# Patient Record
Sex: Male | Born: 1955 | Race: White | Hispanic: No | Marital: Married | Smoking: Never smoker
Health system: Southern US, Community
[De-identification: ages and names within clinical notes are randomized; demographics above are authoritative.]

## PROBLEM LIST (undated history)

## (undated) DIAGNOSIS — M1990 Unspecified osteoarthritis, unspecified site: Secondary | ICD-10-CM

## (undated) DIAGNOSIS — Z87442 Personal history of urinary calculi: Secondary | ICD-10-CM

## (undated) DIAGNOSIS — I10 Essential (primary) hypertension: Secondary | ICD-10-CM

## (undated) DIAGNOSIS — R0600 Dyspnea, unspecified: Secondary | ICD-10-CM

## (undated) DIAGNOSIS — J189 Pneumonia, unspecified organism: Secondary | ICD-10-CM

## (undated) DIAGNOSIS — E119 Type 2 diabetes mellitus without complications: Secondary | ICD-10-CM

## (undated) DIAGNOSIS — K219 Gastro-esophageal reflux disease without esophagitis: Secondary | ICD-10-CM

## (undated) DIAGNOSIS — E785 Hyperlipidemia, unspecified: Secondary | ICD-10-CM

## (undated) DIAGNOSIS — M109 Gout, unspecified: Secondary | ICD-10-CM

## (undated) DIAGNOSIS — I2510 Atherosclerotic heart disease of native coronary artery without angina pectoris: Secondary | ICD-10-CM

## (undated) DIAGNOSIS — I499 Cardiac arrhythmia, unspecified: Secondary | ICD-10-CM

## (undated) HISTORY — PX: SURGERY SCROTAL / TESTICULAR: SUR1316

## (undated) HISTORY — PX: TONSILLECTOMY: SUR1361

## (undated) HISTORY — PX: CHOLECYSTECTOMY: SHX55

## (undated) HISTORY — PX: APPENDECTOMY: SHX54

## (undated) HISTORY — DX: Atherosclerotic heart disease of native coronary artery without angina pectoris: I25.10

## (undated) HISTORY — DX: Hyperlipidemia, unspecified: E78.5

## (undated) HISTORY — PX: KNEE SURGERY: SHX244

## (undated) NOTE — Progress Notes (Signed)
 Formatting of this note might be different from the original. 48 Hour Holter monitor placement has been establish. Patient has been advised to keep the monitor dry as well to return the monitor to the main desk.  Electronically signed by Carlin Fabry Dent, Tech at 12/23/2020  4:13 PM EST

## (undated) NOTE — Progress Notes (Signed)
 Formatting of this note is different from the original. Images from the original note were not included.  [facility] 12/21/2020   Patient Name:  Parker Daniel  (MRN: [id number])   DOB: 1956/09/04    Primary Care Provider: Donnice SAUNDERS. Messier, MD  Primary Cardiologist: No primary care provider on file.  Primary Electrophysiologist: Michalene Skene, M.D., M.P.H., F.A.C.C. Primary Cardiology APP: Delon Louder, PA-C   Atrial Fibrillation Clinic New Patient Consultation   Assessment/Plan:  1. Persistent Atrial Fibrillation  ? CHA2DS2-VASc is 2 (2.2% adjusted Annual Stroke Risk); he will be started on anticoagulation for stroke risk reduction (Eliquis ) ? Although he is asymptomatic when in atrial fibrillation, we will complete workup first, and then decide whether to pursue rhythm control, or rate control ? start apixaban  for stroke risk reduction ? 48 hour holter monitor ordered to assess rate response and to see if atrial fib is persistent or paroxysmal ? Ordered Echocardiogram to assess LV function, LA size, and valvular structure.  ?  Because of concerning symptoms, I will order a stress test to assess for underlying coronary artery disease, and this may also help to guide antiarrhythmic therapy choices in the future ? Followup with electrophysioloy in about 6 weeks  2. Risk factors modification: We discussed the importance of aggressive risk factor modification as it relates to successful management of atrial fibrillation. ? We reviewed the importance of regular exercise and a healthy diet. ? We reviewed the importance of blood pressure control. ? We reviewed the importance of reduction and or abstinence of tobacco and alcohol use. ? He declines a sleep study at this time  1. New onset atrial fibrillation (HCC)   2. Essential hypertension   3. Controlled type 2 diabetes mellitus without complication, without long-term current use of insulin  (HCC)   4. Severe  obesity (BMI 35.0-39.9) with comorbidity [facility])     Subjective:  Chief Complaint Chief Complaint  Patient presents with  ? New Patient    afib    History of Present Illness Parker Daniel is a 49 y.o. male who is referred to [facility] for evaluation and management of persistent atrial fibrillation.    His medical problems include diet-controlled diabetes mellitus, hypercholesterolemia, hypertension, spinal stenosis, obesity, and anxiety. Had been on metformin and lisinopril in the past, these were discontinued in summer 2020 when he presented with dehydration and SBP in the 70's systolic.   Atrial fibrillation reportedly diagnosed on [facility] screen a couple months ago.  He was seen by his PCP on 12/09/2020, and diagnosis was confirmed with an EKG.  He has no rhythm awareness, and denies any palpitations.  He has no chest pain or pressure with exertion, but does tell me that he gets out of breath easily, and that this is going on for probably a couple of years.  When asked how much exertion he is able to do he says ?not much? he gets short of breath walking up a flight of stairs or walking around the block.  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  He denies orthopnea and paroxysmal nocturnal dyspnea.  No lightheadedness, dizziness, near-syncope or syncope.    Allergies Allergies  Allergen Reactions  ? Crestor [Rosuvastatin] Myalgia    Takes pravastatin  at home  ? Iodinated Contrast Media Other (See Comments)    Cannot remember well--thought he was going to  pass out  ? Lipitor [Atorvastatin ] Myalgia    Takes pravastatin  at home    Medications Current Outpatient Medications  Medication Sig Dispense Refill  ? allopurinoL  (ZYLOPRIM ) 300 MG tablet Take 1 tablet (300 mg total) by mouth daily. 90 tablet 3  ? aspirin  (ECOTRIN) 81 MG EC tablet Take 1 tablet (81 mg total) by  mouth daily. 30 tablet 11  ? gabapentin  (NEURONTIN ) 300 MG capsule Take 2 capsules (600 mg total) by mouth 3 (three) times daily. 540 capsule 3  ? NARCAN 4 mg/actuation nasal spray use as directed PER package instructions 2 each 0  ? [START ON 01/09/2021] oxyCODONE  (OXYCONTIN ) 20 MG 12 hr tablet Take 1 tablet (20 mg total) by mouth every 12 (twelve) hours. 60 tablet 0  ? [START ON 02/06/2021] oxyCODONE  (OXYCONTIN ) 20 MG 12 hr tablet Take 1 tablet (20 mg total) by mouth every 12 (twelve) hours. 60 tablet 0  ? [START ON 01/09/2021] oxyCODONE  (ROXICODONE ) 15 MG immediate release tablet Take 1 tablet (15 mg total) by mouth 2 (two) times daily as needed for Pain. 60 tablet 0  ? [START ON 02/06/2021] oxyCODONE  (ROXICODONE ) 15 MG immediate release tablet Take 1 tablet (15 mg total) by mouth 2 (two) times daily as needed for Pain. 60 tablet 0  ? pravastatin  (PRAVACHOL ) 40 MG tablet Take 1 tablet (40 mg total) by mouth daily. 90 tablet 3  ? sennosides (SENNA LAX ORAL) Take 4 capsules by mouth daily as needed.     ? testosterone cypionate (DEPOTESTOTERONE CYPIONATE) 200 mg/mL injection Inject 200 mg into the muscle every 21 days.     ? traZODone  (DESYREL ) 50 MG tablet Take 1 tablet (50 mg total) by mouth nightly. 30 tablet 0   No current facility-administered medications for this visit.    Past Medical History Past Medical History:  Diagnosis Date  ? Acute tonsillitis 12/02/2010  ? Arthritis   ? Carpal tunnel syndrome of right wrist 02/24/2011  ? Chronic pain    back, right knee  ? Colon polyp   ? 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 Diabetes mellitus, type II (HCC)    NIDDM--borderlilne  ? DOE (dyspnea on exertion)    says he is deconditioned--DOE w/ extended activities  ? Dysuria 12/02/2010  ? Gout 12/02/2010  ? History of appendectomy and surgical kidney stone removal 12/02/2010  ? History of BPH   ? History of cholecystectomy 12/02/2010  ? Hyperlipidemia 12/02/2010  ? Hypertension  12/02/2010  ? Indwelling urethral catheter present    since @ 11-19-17  ? Nephrolithiasis 12/02/2010  ? Nonspecific abnormal results of function study of liver 12/02/2010  ? Spinal stenosis, lumbar 12/02/2010    Surgical History Past Surgical History:  Procedure Laterality Date  ? APPENDECTOMY    ? CHOLECYSTECTOMY    ? COLONOSCOPY  05/30/2012, 03/25/2007   (+ polyps)    HGE   ? KIDNEY STONE SURGERY    ? LITHOTRIPSY     x 2  ? POLYPECTOMY    ? SURGERY SCROTAL / TESTICULAR     REMOVED AT AGE 89? SIDE?     Family History He family history includes Cancer - Other in his father; Kidney disease in an other family member.   Social History  He  reports that he has never smoked. He has never used smokeless tobacco. He reports current alcohol use. He reports that he does not use drugs.   Review of Systems General:  negative for fevers, chills, fatigue, night sweats or weight  changes  Respiratory: negative for shortness of breath, cough, wheezing or hemoptysis  Cardiovascular:   as in HPI  Gastrointestinal:  negative for nausea, vomiting, heartburn, abdominal pain or melena  Musculoskeletal: negative for joint stiffness, joint swelling, muscle pain or weakness  Neurological: negative for seizures, tremors, sensory changes or weakness   Objective:  Physical Exam BP 138/86   Pulse 96   Ht 5' 9 (1.753 m)   Wt 266 lb (121 kg)   BMI 39.28 kg/m   BP Readings from Last 2 Encounters:  12/21/20 138/86  12/09/20 138/84   Wt Readings from Last 5 Encounters:  12/21/20 266 lb (121 kg)  12/09/20 266 lb (121 kg)  09/09/20 256 lb (116 kg)  06/10/20 247 lb (112 kg)  06/04/20 247 lb (112 kg)   General Appearance:  Alert, cooperative, no acute distress  Neck: No JVD or carotid bruits  Lungs:   Good respiratory effort, Lungs are clear to auscultation   Heart:  irregular rhythm, normal rate with normal s1, s2, no murmur, no rub or gallop   Abdomen:   Normoactive bowel sounds, soft, non-tender,  no distention  Extremities: No cyanosis or edema   Pulses: 2+ and symmetric  Skin: Intact, no rashes or lesions  Neurologic: Grossly non-focal, AOx3   Data:  ECG 12/21/2020:  Atrial fibrillation with a ventricular rate of 96 beats per minute, normal intervals and axis, no acute ST T wave changes noted, no ectopy.  No specialty comments available.   Labs:  Pertinent Labs (& date obtained) Lab Results  Component Value Date   K 4.5 12/09/2020   CREATININE 1.1 12/09/2020   WBC 8.42 06/04/2020   HGB 12.4 (L) 06/04/2020   HCT 41.6 06/04/2020   PLT 317.00 06/04/2020   TSHHIGHSENSI 0.78 06/04/2020   ALT 26 12/09/2020   AST 25.0 12/09/2020   Stroke Risk Calculator: CHA2DS2-VASc Score _0__ Congestive Heart Failure/Left Ventricular Dysfunction _1__ Hypertension _0__ Age (65-74 years) _2__ Diabetes Mellitus _0__ Stroke, TIA, or Thromboembolism (yes = 2) _0__ Vascular Disease _0__ Age (>75 years) _0__ Sex/Gender (male = 1, male = 0) Total: 2 (2.2% adjusted Annual Stroke Risk), anticoagulation is indicated. (ref. Chadsvasc.org)  We discussed in detail the pathophysiology, etiology, and manifestations of atrial fibrillation, including slow or fast heart rate, congestive heart failure, and stroke potential.  We discussed the clinical risk factors for atrial fibrillation, including our recommendation for aggressive control and management of all of these risk factors as it pertains to overall success of management of atrial fibrillation.   We discussed the risks and benefits of anticoagulation for stroke risk reduction.    Return in about 4 weeks (around 01/18/2021) for 4-6 weeks Dr. Melanee. An After Visit Summary (AVS) was printed and given to the patient.  __________________________ Delon Louder, PA-C [facility] Electronically signed by Michalene Melanee, MD at 12/21/2020  4:50 PM EST

---

## 2010-12-02 DIAGNOSIS — M48061 Spinal stenosis, lumbar region without neurogenic claudication: Secondary | ICD-10-CM | POA: Insufficient documentation

## 2010-12-02 DIAGNOSIS — M109 Gout, unspecified: Secondary | ICD-10-CM | POA: Insufficient documentation

## 2010-12-02 DIAGNOSIS — E1169 Type 2 diabetes mellitus with other specified complication: Secondary | ICD-10-CM | POA: Insufficient documentation

## 2014-08-20 DIAGNOSIS — Z79899 Other long term (current) drug therapy: Secondary | ICD-10-CM | POA: Insufficient documentation

## 2016-03-09 DIAGNOSIS — E119 Type 2 diabetes mellitus without complications: Secondary | ICD-10-CM | POA: Insufficient documentation

## 2017-06-08 DIAGNOSIS — E538 Deficiency of other specified B group vitamins: Secondary | ICD-10-CM | POA: Insufficient documentation

## 2017-06-08 DIAGNOSIS — G609 Hereditary and idiopathic neuropathy, unspecified: Secondary | ICD-10-CM | POA: Insufficient documentation

## 2017-12-06 DIAGNOSIS — R338 Other retention of urine: Secondary | ICD-10-CM | POA: Insufficient documentation

## 2019-04-01 ENCOUNTER — Emergency Department (HOSPITAL_COMMUNITY): Payer: Medicare HMO

## 2019-04-01 ENCOUNTER — Emergency Department (HOSPITAL_COMMUNITY)
Admission: EM | Admit: 2019-04-01 | Discharge: 2019-04-01 | Disposition: A | Discharge status: Home / Self Care | Payer: Medicare HMO | Attending: Emergency Medicine | Admitting: Emergency Medicine

## 2019-04-01 ENCOUNTER — Other Ambulatory Visit: Payer: Self-pay

## 2019-04-01 ENCOUNTER — Ambulatory Visit (INDEPENDENT_AMBULATORY_CARE_PROVIDER_SITE_OTHER)
Admission: EM | Admit: 2019-04-01 | Discharge: 2019-04-01 | Disposition: A | Discharge status: Home / Self Care | Payer: Medicare HMO | Source: Home / Self Care

## 2019-04-01 DIAGNOSIS — Z20828 Contact with and (suspected) exposure to other viral communicable diseases: Secondary | ICD-10-CM | POA: Insufficient documentation

## 2019-04-01 DIAGNOSIS — R1030 Lower abdominal pain, unspecified: Secondary | ICD-10-CM | POA: Diagnosis not present

## 2019-04-01 DIAGNOSIS — I10 Essential (primary) hypertension: Secondary | ICD-10-CM | POA: Insufficient documentation

## 2019-04-01 DIAGNOSIS — E119 Type 2 diabetes mellitus without complications: Secondary | ICD-10-CM | POA: Diagnosis not present

## 2019-04-01 DIAGNOSIS — J189 Pneumonia, unspecified organism: Secondary | ICD-10-CM | POA: Diagnosis not present

## 2019-04-01 DIAGNOSIS — Z79899 Other long term (current) drug therapy: Secondary | ICD-10-CM | POA: Diagnosis not present

## 2019-04-01 DIAGNOSIS — R1013 Epigastric pain: Secondary | ICD-10-CM | POA: Diagnosis not present

## 2019-04-01 DIAGNOSIS — Z7984 Long term (current) use of oral hypoglycemic drugs: Secondary | ICD-10-CM | POA: Insufficient documentation

## 2019-04-01 DIAGNOSIS — R1010 Upper abdominal pain, unspecified: Secondary | ICD-10-CM

## 2019-04-01 HISTORY — DX: Type 2 diabetes mellitus without complications: E11.9

## 2019-04-01 HISTORY — DX: Gout, unspecified: M10.9

## 2019-04-01 HISTORY — DX: Unspecified osteoarthritis, unspecified site: M19.90

## 2019-04-01 HISTORY — DX: Essential (primary) hypertension: I10

## 2019-04-01 LAB — COMPREHENSIVE METABOLIC PANEL
ALT: 21 U/L (ref 0–44)
AST: 17 U/L (ref 15–41)
Albumin: 3.4 g/dL — ABNORMAL LOW (ref 3.5–5.0)
Alkaline Phosphatase: 76 U/L (ref 38–126)
Anion gap: 12 (ref 5–15)
BUN: 15 mg/dL (ref 8–23)
CO2: 23 mmol/L (ref 22–32)
Calcium: 10 mg/dL (ref 8.9–10.3)
Chloride: 102 mmol/L (ref 98–111)
Creatinine, Ser: 1.14 mg/dL (ref 0.61–1.24)
GFR calc Af Amer: >60 mL/min (ref >60)
GFR calc non Af Amer: >60 mL/min (ref >60)
Glucose, Bld: 117 mg/dL — ABNORMAL HIGH (ref 70–99)
Potassium: 3.7 mmol/L (ref 3.5–5.1)
Sodium: 137 mmol/L (ref 135–145)
Total Bilirubin: 0.9 mg/dL (ref 0.3–1.2)
Total Protein: 7.8 g/dL (ref 6.5–8.1)

## 2019-04-01 LAB — URINALYSIS, ROUTINE W REFLEX MICROSCOPIC
Bilirubin Urine: NEGATIVE
Glucose, UA: NEGATIVE mg/dL
Hgb urine dipstick: NEGATIVE
Ketones, ur: 20 mg/dL — AB
Leukocytes,Ua: NEGATIVE
Nitrite: NEGATIVE
Protein, ur: NEGATIVE mg/dL
Specific Gravity, Urine: 1.019 (ref 1.005–1.030)
pH: 7 (ref 5.0–8.0)

## 2019-04-01 LAB — CBC WITH DIFFERENTIAL/PLATELET
Abs Immature Granulocytes: 0.7 10*3/uL — ABNORMAL HIGH (ref 0.00–0.07)
Basophils Absolute: 0.1 10*3/uL (ref 0.0–0.1)
Basophils Relative: 1 %
Eosinophils Absolute: 0.1 10*3/uL (ref 0.0–0.5)
Eosinophils Relative: 1 %
HCT: 36.6 % — ABNORMAL LOW (ref 39.0–52.0)
Hemoglobin: 12 g/dL — ABNORMAL LOW (ref 13.0–17.0)
Immature Granulocytes: 6 %
Lymphocytes Relative: 16 %
Lymphs Abs: 2.1 10*3/uL (ref 0.7–4.0)
MCH: 30.4 pg (ref 26.0–34.0)
MCHC: 32.8 g/dL (ref 30.0–36.0)
MCV: 92.7 fL (ref 80.0–100.0)
Monocytes Absolute: 1.4 10*3/uL — ABNORMAL HIGH (ref 0.1–1.0)
Monocytes Relative: 11 %
Neutro Abs: 8.4 10*3/uL — ABNORMAL HIGH (ref 1.7–7.7)
Neutrophils Relative %: 65 %
Platelets: 273 10*3/uL (ref 150–400)
RBC: 3.95 MIL/uL — ABNORMAL LOW (ref 4.22–5.81)
RDW: 13.2 % (ref 11.5–15.5)
WBC: 12.7 10*3/uL — ABNORMAL HIGH (ref 4.0–10.5)
nRBC: 0.2 % (ref 0.0–0.2)

## 2019-04-01 LAB — CBC
HCT: 36.8 % — ABNORMAL LOW (ref 39.0–52.0)
Hemoglobin: 11.8 g/dL — ABNORMAL LOW (ref 13.0–17.0)
MCH: 29.5 pg (ref 26.0–34.0)
MCHC: 32.1 g/dL (ref 30.0–36.0)
MCV: 92 fL (ref 80.0–100.0)
Platelets: 278 10*3/uL (ref 150–400)
RBC: 4 MIL/uL — ABNORMAL LOW (ref 4.22–5.81)
RDW: 13.1 % (ref 11.5–15.5)
WBC: 12.8 10*3/uL — ABNORMAL HIGH (ref 4.0–10.5)
nRBC: 0.2 % (ref 0.0–0.2)

## 2019-04-01 LAB — SARS CORONAVIRUS 2 BY RT PCR (HOSPITAL ORDER, PERFORMED IN ~~LOC~~ HOSPITAL LAB): SARS Coronavirus 2: NEGATIVE

## 2019-04-01 LAB — LIPASE, BLOOD: Lipase: 25 U/L (ref 11–51)

## 2019-04-01 LAB — LACTIC ACID, PLASMA: Lactic Acid, Venous: 1 mmol/L (ref 0.5–1.9)

## 2019-04-01 MED ORDER — MORPHINE SULFATE (PF) 4 MG/ML IV SOLN
4.0000 mg | Freq: Once | INTRAVENOUS | Status: AC
  Administered 2019-04-01: 16:00:00 4 mg via INTRAVENOUS
  Filled 2019-04-01: qty 1

## 2019-04-01 MED ORDER — DOXYCYCLINE HYCLATE 100 MG PO CAPS: 100.0000 mg | ORAL_CAPSULE | Freq: Two times a day (BID) | ORAL | 0 refills | Status: AC

## 2019-04-01 MED ORDER — PANTOPRAZOLE SODIUM 20 MG PO TBEC: 20.0000 mg | DELAYED_RELEASE_TABLET | Freq: Every day | ORAL | 0 refills | Status: DC

## 2019-04-01 MED ORDER — IOHEXOL 300 MG/ML  SOLN
100.0000 mL | Freq: Once | INTRAMUSCULAR | Status: AC | PRN
  Administered 2019-04-01: 18:00:00 100 mL via INTRAVENOUS

## 2019-04-01 MED ORDER — SODIUM CHLORIDE 0.9 % IV SOLN
500.0000 mg | Freq: Once | INTRAVENOUS | Status: AC
  Administered 2019-04-01: 21:00:00 500 mg via INTRAVENOUS
  Filled 2019-04-01: qty 500

## 2019-04-01 MED ORDER — MORPHINE SULFATE (PF) 4 MG/ML IV SOLN
4.0000 mg | Freq: Once | INTRAVENOUS | Status: AC
  Administered 2019-04-01: 21:00:00 4 mg via INTRAVENOUS
  Filled 2019-04-01: qty 1

## 2019-04-01 MED ORDER — LIDOCAINE VISCOUS HCL 2 % MT SOLN
15.0000 mL | Freq: Once | OROMUCOSAL | Status: AC
  Administered 2019-04-01: 19:00:00 15 mL via ORAL
  Filled 2019-04-01: qty 15

## 2019-04-01 MED ORDER — HYDROMORPHONE HCL 1 MG/ML IJ SOLN
1.0000 mg | Freq: Once | INTRAMUSCULAR | Status: AC
  Administered 2019-04-01: 1 mg via INTRAVENOUS
  Filled 2019-04-01: qty 1

## 2019-04-01 MED ORDER — SODIUM CHLORIDE 0.9 % IV SOLN
INTRAVENOUS | Status: DC | PRN
  Administered 2019-04-01: 21:00:00 via INTRAVENOUS

## 2019-04-01 MED ORDER — PANTOPRAZOLE SODIUM 40 MG IV SOLR
40.0000 mg | Freq: Once | INTRAVENOUS | Status: AC
  Administered 2019-04-01: 23:00:00 40 mg via INTRAVENOUS
  Filled 2019-04-01: qty 40

## 2019-04-01 MED ORDER — ALUM & MAG HYDROXIDE-SIMETH 200-200-20 MG/5ML PO SUSP
30.0000 mL | Freq: Once | ORAL | Status: AC
  Administered 2019-04-01: 30 mL via ORAL
  Filled 2019-04-01: qty 30

## 2019-04-01 MED ORDER — ONDANSETRON HCL 4 MG/2ML IJ SOLN
4.0000 mg | Freq: Once | INTRAMUSCULAR | Status: AC
  Administered 2019-04-01: 4 mg via INTRAVENOUS
  Filled 2019-04-01: qty 2

## 2019-04-01 MED ORDER — ONDANSETRON HCL 4 MG/2ML IJ SOLN
2.0000 mg | Freq: Once | INTRAMUSCULAR | Status: AC
  Administered 2019-04-01: 19:00:00 2 mg via INTRAVENOUS
  Filled 2019-04-01: qty 2

## 2019-04-01 MED ORDER — SODIUM CHLORIDE 0.9 % IV SOLN
1.0000 g | Freq: Once | INTRAVENOUS | Status: AC
  Administered 2019-04-01: 1 g via INTRAVENOUS
  Filled 2019-04-01: qty 10

## 2019-04-01 MED ORDER — SODIUM CHLORIDE 0.9 % IV BOLUS
1000.0000 mL | Freq: Once | INTRAVENOUS | Status: AC
  Administered 2019-04-01: 1000 mL via INTRAVENOUS

## 2019-04-01 NOTE — ED Provider Notes (Signed)
[facility] EMERGENCY DEPARTMENT Provider Note   CSN: [id number] Arrival date & time: 04/01/19  1350    History   Chief Complaint Chief Complaint  Patient presents with   Abdominal Pain    HPI Parker Daniel is a 63 y.o. male with history of hypertension, diabetes, gout presenting for abdominal pain.  Patient reports abdominal pain onset 2-3 days constant since onset burning central abdomen epigastric area occasionally radiating down to the lower abdomen worsened with outpatient temporarily improved with drinking milk.  Patient denies similar pain in the past.  Associated symptoms nausea/vomiting 2-3 episodes nonbloody/nonbilious.  Patient denies history of fever/chills, cough/shortness of breath, chest pain, diarrhea, dysuria/hematuria, testicular pain/swelling, history of gastric ulcer disease or any additional concerns at this time.     HPI  Past Medical History:  Diagnosis Date   Arthritis    Diabetes mellitus without complication (HCC)    Gout    Hypertension     There are no active problems to display for this patient.   Past Surgical History:  Procedure Laterality Date   KNEE SURGERY          Home Medications    Prior to Admission medications   Medication Sig Start Date End Date Taking? Authorizing Provider  allopurinol (ZYLOPRIM) 300 MG tablet Take 300 mg by mouth daily.   Yes [provider]  gabapentin (NEURONTIN) 300 MG capsule Take 600 mg by mouth 3 (three) times daily.   Yes [provider]  lisinopril (ZESTRIL) 20 MG tablet Take 20 mg by mouth daily.   Yes [provider]  metFORMIN (GLUCOPHAGE) 500 MG tablet Take 500 mg by mouth 2 (two) times daily with a meal.   Yes [provider]  naproxen sodium (ALEVE) 220 MG tablet Take 220 mg by mouth daily as needed (pain).    Yes [provider]  oxyCODONE (OXYCONTIN) 20 mg 12 hr tablet Take 20 mg by mouth every 12 (twelve) hours.   Yes  [provider]  oxyCODONE (ROXICODONE) 15 MG immediate release tablet Take 15 mg by mouth 2 (two) times daily as needed for pain.   Yes [provider]  doxycycline (VIBRAMYCIN) 100 MG capsule Take 1 capsule (100 mg total) by mouth 2 (two) times daily for 7 days. 04/01/19 04/08/19  Harlene Salts A, PA-C  pantoprazole (PROTONIX) 20 MG tablet Take 1 tablet (20 mg total) by mouth daily. 04/01/19   Bill Salinas, PA-C    Family History No family history on file.  Social History Social History   Tobacco Use   Smoking status: Never Smoker   Smokeless tobacco: Never Used  Substance Use Topics   Alcohol use: Not Currently   Drug use: Not on file     Allergies   Patient has no known allergies.   Review of Systems Review of Systems  Constitutional: Negative.  Negative for chills and fever.  Respiratory: Negative.  Negative for cough and shortness of breath.   Cardiovascular: Negative.  Negative for chest pain and leg swelling.  Gastrointestinal: Positive for abdominal pain, nausea and vomiting. Negative for diarrhea (Reports soft stools, denies diarrhea).  Genitourinary: Negative.  Negative for dysuria, hematuria, scrotal swelling and testicular pain.  All other systems reviewed and are negative.  Physical Exam Updated Vital Signs BP 130/87    Pulse 85    Temp 98.2 F (36.8 C) (Oral)    Resp 18    Ht 6' (1.829 m)    [employer]  102.1 kg    SpO2 96%    BMI 30.52 kg/m   Physical Exam Constitutional:      General: He is not in acute distress.    Appearance: Normal appearance. He is well-developed. He is not ill-appearing or diaphoretic.     Comments: Uncomfortable appearing  HENT:     Head: Normocephalic and atraumatic.     Right Ear: External ear normal.     Left Ear: External ear normal.     Nose: Nose normal.  Eyes:     General: Vision grossly intact. Gaze aligned appropriately.     Pupils: Pupils are equal, round, and reactive to light.  Neck:      Musculoskeletal: Normal range of motion and neck supple.     Trachea: Trachea and phonation normal. No tracheal deviation.  Cardiovascular:     Rate and Rhythm: Normal rate and regular rhythm.     Pulses:          Radial pulses are 2+ on the right side and 2+ on the left side.       Dorsalis pedis pulses are 2+ on the right side and 2+ on the left side.       Posterior tibial pulses are 2+ on the right side and 2+ on the left side.     Heart sounds: Normal heart sounds.  Pulmonary:     Effort: Pulmonary effort is normal. No respiratory distress.     Breath sounds: Normal breath sounds. No rhonchi.  Abdominal:     General: Bowel sounds are normal. There is no distension.     Palpations: Abdomen is soft. There is no pulsatile mass.     Tenderness: There is abdominal tenderness in the epigastric area and periumbilical area. There is guarding (Voluntary). There is no right CVA tenderness, left CVA tenderness or rebound. Negative signs include Murphy's sign, Rovsing's sign and McBurney's sign.  Genitourinary:    Comments: Exam deferred by patient Musculoskeletal: Normal range of motion.  Feet:     Right foot:     Protective Sensation: 2 sites tested. 2 sites sensed.     Left foot:     Protective Sensation: 2 sites tested. 2 sites sensed.  Skin:    General: Skin is warm and dry.  Neurological:     Mental Status: He is alert.     GCS: GCS eye subscore is 4. GCS verbal subscore is 5. GCS motor subscore is 6.     Comments: Speech is clear and goal oriented, follows commands Major Cranial nerves without deficit, no facial droop Moves extremities without ataxia, coordination intact  Psychiatric:        Behavior: Behavior normal.     ED Treatments / Results  Labs (all labs ordered are listed, but only abnormal results are displayed) Labs Reviewed  COMPREHENSIVE METABOLIC PANEL - Abnormal; Notable for the following components:      Result Value   Glucose, Bld 117 (*)    Albumin 3.4 (*)     All other components within normal limits  CBC - Abnormal; Notable for the following components:   WBC 12.8 (*)    RBC 4.00 (*)    Hemoglobin 11.8 (*)    HCT 36.8 (*)    All other components within normal limits  URINALYSIS, ROUTINE W REFLEX MICROSCOPIC - Abnormal; Notable for the following components:   Ketones, ur 20 (*)    All other components within normal limits  CBC WITH DIFFERENTIAL/PLATELET - Abnormal; Notable  for the following components:   WBC 12.7 (*)    RBC 3.95 (*)    Hemoglobin 12.0 (*)    HCT 36.6 (*)    Neutro Abs 8.4 (*)    Monocytes Absolute 1.4 (*)    Abs Immature Granulocytes 0.70 (*)    All other components within normal limits  SARS CORONAVIRUS 2 (HOSPITAL ORDER, PERFORMED IN [facility])  URINE CULTURE  CULTURE, BLOOD (ROUTINE X 2)  CULTURE, BLOOD (ROUTINE X 2)  LIPASE, BLOOD  LACTIC ACID, PLASMA  LACTIC ACID, PLASMA    EKG None  Radiology Ct Abdomen Pelvis W Contrast  Result Date: 04/01/2019 CLINICAL DATA:  Abdominal pain, primarily epigastric region EXAM: CT ABDOMEN AND PELVIS WITH CONTRAST TECHNIQUE: Multidetector CT imaging of the abdomen and pelvis was performed using the standard protocol following bolus administration of intravenous contrast. CONTRAST:  OMNIPAQUE IOHEXOL 300 MG/ML  SOLN COMPARISON:  None. FINDINGS: Lower chest: There is patchy infiltrate in both lower lobes and in the right middle lobe consistent with multifocal pneumonia. Hepatobiliary: No focal liver lesions are apparent. Gallbladder is absent. There is no biliary duct dilatation. Pancreas: There is no pancreatic mass or inflammatory focus. Spleen: No splenic lesions are evident. Adrenals/Urinary Tract: Adrenals bilaterally appear normal. There is scarring along the posterior aspect of the mid right kidney. There is a cyst arising from the posterior mid to lower pole right kidney measuring 1.1 x 1.1 cm. A cyst arising from the posterior mid left kidney measures  1.3 x 1.0 cm. There is no appreciable hydronephrosis on either side. There is a 1 mm calculus in the posterior mid right kidney. There is a 3 x 2 mm calculus with an adjacent 1.0 x 0.8 cm calculus in the lower pole of the right kidney. There is a 2 mm calculus in the lower pole left kidney. There is no appreciable renal or ureteral calculus on either side. Urinary bladder is midline with wall thickness within normal limits. Stomach/Bowel: There is no appreciable bowel wall or mesenteric thickening. There is no appreciable bowel obstruction. The terminal ileum appears normal. There is mild lipomatous infiltration of the ileocecal valve. There is no free air or portal venous air evident. Vascular/Lymphatic: No abdominal aortic aneurysm evident. There are foci of atherosclerotic calcification in the aorta and common iliac arteries. No adenopathy is evident in the abdomen or pelvis. Reproductive: Prostate contains several small calcifications. There is no prostate or seminal vesicle enlargement. No pelvic mass evident. Other: There is postoperative change along the lower right pelvic wall. No hernia seen currently in this area. Appendix appears absent. No periappendiceal region inflammation evident. There is no abscess or ascites in the abdomen or pelvis. Musculoskeletal: There are multiple foci of degenerative change in the lower thoracic and lumbar spine. There is extensive osteoarthritic change in the left hip joint with multiple subchondral cysts in the acetabulum and femoral head region on the left. No blastic or lytic bone lesions are demonstrable. No intramuscular lesions are evident. No appreciable abdominal wall lesions. IMPRESSION: 1.  Multifocal bibasilar pneumonia. 2. Nonobstructing calculi in each kidney, more and larger on the right than on the left. No hydronephrosis or ureteral calculus on either side. Urinary bladder wall thickness normal. Several prostatic calculi noted. 3. No bowel obstruction. No  abscess in the abdomen or pelvis. Appendix absent. No periappendiceal region inflammation. 4.  Advanced arthropathy left hip joint. 5.  Gallbladder absent. 6.  Aortic and iliac artery atherosclerosis. Electronically Signed  By: Bretta BangWilliam  Woodruff III M.D.   On: 04/01/2019 19:33   Dg Chest Portable 1 View  Result Date: 04/01/2019 CLINICAL DATA:  Cough and shortness of breath EXAM: PORTABLE CHEST 1 VIEW COMPARISON:  CT abdomen and pelvis Apr 01, 2019 FINDINGS: Areas of patchy pneumonia in the bases are better seen on CT. There is ill-defined patchy opacity in the right mid and lower lung zone and to a lesser extent in the left base. There is an azygos lobe on the right, an anatomic variant. Heart size and pulmonary vascularity are normal. No adenopathy. There is degenerative change in thoracic spine. IMPRESSION: Patchy pneumonia in the right mid and lower lung zones as well as in the left base. The airspace opacities are somewhat better delineated on CT than radiography. Heart size normal. No evident adenopathy. Electronically Signed   By: Bretta BangWilliam  Woodruff III M.D.   On: 04/01/2019 20:31    Procedures Procedures (including critical care time)  Medications Ordered in ED Medications  0.9 %  sodium chloride infusion ( Intravenous Stopped 04/01/19 2307)  sodium chloride 0.9 % bolus 1,000 mL (0 mLs Intravenous Stopped 04/01/19 1926)  ondansetron (ZOFRAN) injection 4 mg (4 mg Intravenous Given 04/01/19 1614)  morphine 4 MG/ML injection 4 mg (4 mg Intravenous Given 04/01/19 1614)  HYDROmorphone (DILAUDID) injection 1 mg (1 mg Intravenous Given 04/01/19 1833)  morphine 4 MG/ML injection 4 mg (4 mg Intravenous Given 04/01/19 2116)  alum & mag hydroxide-simeth (MAALOX/MYLANTA) 200-200-20 MG/5ML suspension 30 mL (30 mLs Oral Given 04/01/19 1844)    And  lidocaine (XYLOCAINE) 2 % viscous mouth solution 15 mL (15 mLs Oral Given 04/01/19 1844)  ondansetron (ZOFRAN) injection 2 mg (2 mg Intravenous Given 04/01/19 1844)    iohexol (OMNIPAQUE) 300 MG/ML solution 100 mL (100 mLs Intravenous Contrast Given 04/01/19 1823)  cefTRIAXone (ROCEPHIN) 1 g in sodium chloride 0.9 % 100 mL IVPB (0 g Intravenous Stopped 04/01/19 2109)  azithromycin (ZITHROMAX) 500 mg in sodium chloride 0.9 % 250 mL IVPB (0 mg Intravenous Stopped 04/01/19 2220)  pantoprazole (PROTONIX) injection 40 mg (40 mg Intravenous Given 04/01/19 2305)     Initial Impression / Assessment and Plan / ED Course  I have reviewed the triage vital signs and the nursing notes.  Pertinent labs & imaging results that were available during my care of the patient were reviewed by me and considered in my medical decision making (see chart for details).  Clinical Course as of Apr 01 2331  Tue Apr 01, 2019  1755 Discussed CT delay with radiology, they are aware, patient is next.   [BM]    Clinical Course User Index [BM] Bill SalinasMorelli, Berkley Cronkright A, PA-C   3:45 PM: Initial evaluation patient uncomfortable appearing however nontoxic with central abdominal pain radiating to the epigastrium as well as lower abdomen diffusely tender, voluntary guarding, no rebound.  Vital signs stable.  Lungs clear to auscultation bilaterally heart regular rate and rhythm, distal pulses intact and equal in all 4 extremities, no palpable pulsatile masses of the abdomen.  Will order fluids, pain medication, nausea medication, CT abdomen pelvis. - CBC with leukocytosis of 12.7 Lipase within normal limits CMP nonacute Urinalysis with ketones present Fluid bolus given, pain nausea medication given - CT abd/pelvis:  IMPRESSION:  1. Multifocal bibasilar pneumonia.    2. Nonobstructing calculi in each kidney, more and larger on the  right than on the left. No hydronephrosis or ureteral calculus on  either side. Urinary bladder wall thickness normal. Several  prostatic calculi noted.    3. No bowel obstruction. No abscess in the abdomen or pelvis.  Appendix absent. No periappendiceal region  inflammation.    4. Advanced arthropathy left hip joint.    5. Gallbladder absent.    6. Aortic and iliac artery atherosclerosis.   Patient was reassessed resting comfortably no acute distress.  Work-up today expanded, blood cultures, lactic, chest x-ray, COVID-19 test, azithromycin and Rocephin ordered. - Patient reassessed multiple times required multiple doses of pain medicine for his abdominal pain while here in the emergency department he is a chronic narcotic medication user multiple pain medication doses here.  Vital signs have remained stable he is without chest pain, shortness of breath, cough. - Lactic 1.0 COVID-19 negative Blood cultures pending Urine culture pending Azithromycin and Rocephin administered GI cocktail given - Case rediscussed with Dr. Clarice Pole who is seeing the patient. - Plan at this time is to provide patient with 40 mg Protonix IV, discharged with doxycycline 100 mg twice daily x7 days, self quarantine x2 weeks, strict return precautions and PCP follow-up. - Patient ambulated with nursing staff without hypoxia or tachycardia on room air. - Patient's pain and nausea controlled states that he is feeling well and is requesting discharge.  Patient is aware of diagnoses today including incidental findings and states understanding to follow-up with PCP and return to emergency department for any new or worsening symptoms.  Doxycycline 100 mg twice daily x7 days prescribed additional Protonix prescription given for possible GERD-like symptoms causing abdominal pain.  Suspect referred pain from his pneumonia may be contributing to his abdominal pain, he is without chest pain, cough or shortness of breath and is afebrile here no tachycardia or hypoxia on room air he has been ambulated without event there is no indication for admission at this time patient is overall well-appearing in no acute distress.  I have discussed with the patient that despite negative COVID-19  testing today that this may be a false negative and that he should still practice self quarantine for the next 2 weeks follow-up with PCP via tele-visit and avoid potential spread.  He states understanding of plan of care and is agreeable.  At this time there does not appear to be any evidence of an acute emergency medical condition and the patient appears stable for discharge with appropriate outpatient follow up. Diagnosis was discussed with patient who verbalizes understanding of care plan and is agreeable to discharge. I have discussed return precautions with patient who verbalizes understanding of return precautions. Patient encouraged to follow-up with their PCP. All questions answered.  Patient has been discharged in good condition.  Patient's case rediscussed with Dr. Donnald Garre who agrees with plan to discharge with doxycycline, Protonix and outpatient follow-up.   Note: Portions of this report may have been transcribed using voice recognition software. Every effort was made to ensure accuracy; however, inadvertent computerized transcription errors may still be present. Final Clinical Impressions(s) / ED Diagnoses   Final diagnoses:  Multifocal pneumonia  Pain of upper abdomen    ED Discharge Orders         Ordered    doxycycline (VIBRAMYCIN) 100 MG capsule  2 times daily     04/01/19 2307    pantoprazole (PROTONIX) 20 MG tablet  Daily     04/01/19 2307           Elizabeth Palau 04/01/19 2333    Arby Barrette, MD 04/09/19 1347

## 2019-04-01 NOTE — ED Triage Notes (Signed)
Pt c/o lower abdominal pain x2 days, loose stools, vomiting x3; states drinking milk soothes it some.

## 2019-04-01 NOTE — ED Notes (Signed)
RN touched base with EDP and ask if he had updated patient. He stated not yet but will call and update patient.

## 2019-04-01 NOTE — Discharge Instructions (Addendum)
You have been diagnosed today with multifocal pneumonia, upper abdominal pain.  At this time there does not appear to be the presence of an emergent medical condition, however there is always the potential for conditions to change. Please read and follow the below instructions.  Please return to the Emergency Department immediately for any new or worsening symptoms or if your symptoms do not improve within 3 days. Please be sure to follow up with your Primary Care Provider within one week regarding your visit today; please call their office to schedule an appointment even if you are feeling better for a follow-up visit. Please take the antibiotic doxycycline to treat your pneumonia, twice daily for the next 7 days.  Follow-up with your primary care provider via telephone visit within the next 2-3 days.  Return to the emergency department for any new or worsening symptoms. You may take the medication Protonix to help with your abdominal pain this is a medication that helps with acid reflux.  Use only as prescribed. Your COVID-19 test today was negative however false negatives are possible and there is high suspicion that your pneumonia may be caused by COVID-19.  Please self isolate for the next 2 weeks to avoid spread of this disease return to emergency department for any new or worsening symptoms.  Please follow the instructions below regarding self quarantine. Your CT scan had multiple incidental findings including degenerative disc disease, prostate calcifications, kidney cyst, nonobstructing kidney stones and atherosclerosis.  Discuss these findings with your primary care provider at your next visit.  Get help right away if: You have shortness of breath. You have chest pain. Your sickness becomes worse, especially if you are an older adult or have a weakened immune system. You cough up blood. Get help right away if: Your pain does not go away as soon as your doctor says it should. You cannot  stop throwing up. Your pain is only in areas of your belly, such as the right side or the left lower part of the belly. You have bloody or black poop, or poop that looks like tar. You have very bad pain, cramping, or bloating in your belly. You have signs of not having enough fluid or water in your body (dehydration), such as: Dark pee, very little pee, or no pee. Cracked lips. Dry mouth. Sunken eyes. Sleepiness. Weakness.  Please read the additional information packets attached to your discharge summary.  Do not take your medicine if  develop an itchy rash, swelling in your mouth or lips, or difficulty breathing.  Seek help immediately/call 911 if this occurs. ===============  If you live with, or provide care at home for, a person confirmed to have, or being evaluated for, COVID-19 infection please follow these guidelines to prevent infection:  Follow healthcare providers instructions Make sure that you understand and can help the patient follow any healthcare provider instructions for all care.  Provide for the patients basic needs You should help the patient with basic needs in the home and provide support for getting groceries, prescriptions, and other personal needs.  Monitor the patients symptoms If they are getting sicker, call his or her medical provider a  This will help the healthcare providers office take steps to keep other people from getting infected. Ask the healthcare provider to call the local or state health department.  Limit the number of people who have contact with the patient If possible, have only one caregiver for the patient. Other household members should stay in another  home or place of residence. If this is not possible, they should stay in another room, or be separated from the patient as much as possible. Use a separate bathroom, if available. Restrict visitors who do not have an essential need to be in the home.  Keep older adults, very young  children, and other sick people away from the patient Keep older adults, very young children, and those who have compromised immune systems or chronic health conditions away from the patient. This includes people with chronic heart, lung, or kidney conditions, diabetes, and cancer.  Ensure good ventilation Make sure that shared spaces in the home have good air flow, such as from an air conditioner or an opened window, weather permitting.  Wash your hands often Wash your hands often and thoroughly with soap and water for at least 20 seconds. You can use an alcohol based hand sanitizer if soap and water are not available and if your hands are not visibly dirty. Avoid touching your eyes, nose, and mouth with unwashed hands. Use disposable paper towels to dry your hands. If not available, use dedicated cloth towels and replace them when they become wet.  Wear a facemask and gloves Wear a disposable facemask at all times in the room and gloves when you touch or have contact with the patients blood, body fluids, and/or secretions or excretions, such as sweat, saliva, sputum, nasal mucus, vomit, urine, or feces.  Ensure the mask fits over your nose and mouth tightly, and do not touch it during use. Throw out disposable facemasks and gloves after using them. Do not reuse. Wash your hands immediately after removing your facemask and gloves. If your personal clothing becomes contaminated, carefully remove clothing and launder. Wash your hands after handling contaminated clothing. Place all used disposable facemasks, gloves, and other waste in a lined container before disposing them with other household waste. Remove gloves and wash your hands immediately after handling these items.  Do not share dishes, glasses, or other household items with the patient Avoid sharing household items. You should not share dishes, drinking glasses, cups, eating utensils, towels, bedding, or other items After the person  uses these items, you should wash them thoroughly with soap and water.  Wash laundry thoroughly Immediately remove and wash clothes or bedding that have blood, body fluids, and/or secretions or excretions, such as sweat, saliva, sputum, nasal mucus, vomit, urine, or feces, on them. Wear gloves when handling laundry from the patient. Read and follow directions on labels of laundry or clothing items and detergent. In general, wash and dry with the warmest temperatures recommended on the label.  Clean all areas the individual has used often Clean all touchable surfaces, such as counters, tabletops, doorknobs, bathroom fixtures, toilets, phones, keyboards, tablets, and bedside tables, every day. Also, clean any surfaces that may have blood, body fluids, and/or secretions or excretions on them. Wear gloves when cleaning surfaces the patient has come in contact with. Use a diluted bleach solution (e.g., dilute bleach with 1 part bleach and 10 parts water) or a household disinfectant with a label that says EPA-registered for coronaviruses. To make a bleach solution at home, add 1 tablespoon of bleach to 1 quart (4 cups) of water. For a larger supply, add  cup of bleach to 1 gallon (16 cups) of water. Read labels of cleaning products and follow recommendations provided on product labels. Labels contain instructions for safe and effective use of the cleaning product including precautions you should take when applying the  product, such as wearing gloves or eye protection and making sure you have good ventilation during use of the product. Remove gloves and wash hands immediately after cleaning.  Monitor yourself for signs and symptoms of illness Caregivers and household members are considered close contacts, should monitor their health, and will be asked to limit movement outside of the home to the extent possible. Follow the monitoring steps for close contacts listed on the symptom monitoring form.   ?  If you have additional questions, contact your local health department or call the epidemiologist on call at [phone] (available 24/7). ? This guidance is subject to change. For the most up-to-date guidance from [facility], please refer to their website: [employer]

## 2019-04-01 NOTE — ED Notes (Signed)
Patient transported to CT 

## 2019-04-01 NOTE — ED Notes (Signed)
Patient O2 was at 95% Room Air while ambulating. Apolinar Junes, PA-C has been notified.

## 2019-04-01 NOTE — ED Notes (Signed)
Pt understood dc material. NAD noted. Scripts given at [employer]. Pt wheeled out and helped into his wife's car. All questions answered to satisfaction.

## 2019-04-01 NOTE — ED Triage Notes (Signed)
Pt arrives POV for eval of centralized abd pain radiating to epigastric area. Pt reports onset 2-3 days w/ associated N/V. Pt states he was seen at [facility] and sent over here for further eval.

## 2019-04-01 NOTE — ED Provider Notes (Signed)
Medical screening examination/treatment/procedure(s) were conducted as a shared visit with non-physician practitioner(s) and myself.  I personally evaluated the patient during the encounter.  EKG Interpretation  Date/Time:  Tuesday Apr 01 2019 14:16:49 EDT Ventricular Rate:  77 PR Interval:  180 QRS Duration: 92 QT Interval:  390 QTC Calculation: 441 R Axis:   7 Text Interpretation:  Sinus rhythm with Premature atrial complexes Otherwise normal ECG No old tracing to compare Confirmed by Mesner, Barbara Cower [phone]) on 04/02/2019 5:13:57 PM     Patient has had abdominal pain now that has been persistent with a burning uncomfortable epigastric pain.  Patient experienced several episodes of vomiting.  He has not had fevers or chills.  Patient is alert and appropriate.  No respiratory distress.  Heart sounds grossly normal.  Lungs clear.  Mild to moderate epigastric tenderness to palpation.  No guarding.  CT does not identify any significant intra-abdominal anomalies.  It does identify some bibasilar findings suspicious for pneumonia.  Presenting complaint was less suspicious for pneumonia.  Patient is stable in terms of respiratory findings.  Will opt to treat this with doxycycline and empiric self quarantine.  Return precautions have been reviewed.   Arby Barrette, MD 04/09/19 [phone]

## 2019-04-01 NOTE — ED Provider Notes (Signed)
[facility]    CSN: [id number] Arrival date & time: 04/01/19  1242     History   Chief Complaint Chief Complaint  Patient presents with  . Abdominal Pain    HPI Parker Daniel is a 63 y.o. male any for lower abdominal pain.  Patient states he has had constant pain, like a burning sensation, for "the past couple of days, not really sure ".  Patient has been on chronic opioids and Senokot due to chronic pain and arthritis.  Patient states that his BM frequency has been consistent: 1 bowel movement every 1 to 2 days.  Patient's last BM was this morning: Not painful, non-malodorous, no change in color, was loose though not watery, bloody, and without mucus; denies change in frequency.  Patient denies dysuria, increased dribbling of urine, and urea, hematuria.  Patient has not tried anything for his pain: States the only thing that has helped with some chocolate milk this morning.  Patient states that he is also had 3 episodes of emesis: The last one was yesterday.  His emesis is nonbloody, non-biliary, nonpainful.  Patient states he did not experience nausea.  Patient is afebrile and hypertensive in office today: Patient denies recent illness or sick contacts.  He does have history of kidney stones (last episode was greater than 5 years ago).  Of note, patient admits to having cholecystectomy and appendectomy in his youth.  Past Medical History:  Diagnosis Date  . Arthritis   . Diabetes mellitus without complication (HCC)   . Gout   . Hypertension     There are no active problems to display for this patient.   Past Surgical History:  Procedure Laterality Date  . KNEE SURGERY         Home Medications    Prior to Admission medications   Medication Sig Start Date End Date Taking? Authorizing Provider  allopurinol (ZYLOPRIM) 300 MG tablet Take 300 mg by mouth daily.   Yes [provider]  docusate sodium (COLACE) 100 MG capsule Take 100 mg by mouth 2 (two) times  daily.   Yes [provider]  gabapentin (NEURONTIN) 300 MG capsule Take 600 mg by mouth 3 (three) times daily.   Yes [provider]  lisinopril (ZESTRIL) 20 MG tablet Take 20 mg by mouth daily.   Yes [provider]  metFORMIN (GLUCOPHAGE) 500 MG tablet Take 500 mg by mouth 2 (two) times daily with a meal.   Yes [provider]  naproxen sodium (ALEVE) 220 MG tablet Take 220 mg by mouth daily as needed.   Yes [provider]  oxyCODONE (OXYCONTIN) 20 mg 12 hr tablet Take 20 mg by mouth every 12 (twelve) hours.   Yes [provider]  oxyCODONE (ROXICODONE) 15 MG immediate release tablet Take 15 mg by mouth 2 (two) times daily as needed for pain.   Yes [provider]  tamsulosin (FLOMAX) 0.4 MG CAPS capsule Take 0.4 mg by mouth.   Yes [provider]    Family History No family history on file.  Social History Social History   Tobacco Use  . Smoking status: Never Smoker  . Smokeless tobacco: Never Used  Substance Use Topics  . Alcohol use: Not Currently  . Drug use: Not on file     Allergies   Patient has no known allergies.   Review of Systems Review of Systems  Constitutional: Negative for appetite change and fever.  Respiratory: Negative for shortness of breath  and wheezing.   Cardiovascular: Negative for chest pain and palpitations.  Gastrointestinal:       As per HPI      Physical Exam Triage Vital Signs ED Triage Vitals  Enc Vitals Group     BP 04/01/19 1246 (!) 154/94     Pulse Rate 04/01/19 1246 81     Resp 04/01/19 1246 18     Temp 04/01/19 1246 98.3 F (36.8 C)     Temp Source 04/01/19 1246 Oral     SpO2 04/01/19 1246 94 %     Weight --      Height --      Head Circumference --      Peak Flow --      Pain Score 04/01/19 1248 2     Pain Loc --      Pain Edu? --      Excl. in GC? --    No data found.  Updated Vital Signs BP (!) 154/94 (BP Location: Left Arm)   Pulse 81    Temp 98.3 F (36.8 C) (Oral)   Resp 18   SpO2 94%   Visual Acuity Right Eye Distance:   Left Eye Distance:   Bilateral Distance:    Right Eye Near:   Left Eye Near:    Bilateral Near:     Physical Exam Constitutional:      Appearance: He is well-developed. He is obese.     Comments: Patient is in obvious pain: Rocking in exam chair and clutching lower abdomen.  HENT:     Head: Normocephalic and atraumatic.     Mouth/Throat:     Mouth: Mucous membranes are moist.     Pharynx: No pharyngeal swelling or oropharyngeal exudate.  Eyes:     General: No scleral icterus.    Pupils: Pupils are equal, round, and reactive to light.  Cardiovascular:     Rate and Rhythm: Normal rate and regular rhythm.  Pulmonary:     Effort: Pulmonary effort is normal. No respiratory distress.     Breath sounds: Normal breath sounds. No wheezing or rales.  Abdominal:     Palpations: There is no hepatomegaly, splenomegaly or mass.     Comments: Abdomen is round, obese, nondistended.  Infraumbilical transverse incisional scar noted.  Bowel sounds normal in all 4 quadrants.  Patient is tender diffusely, though guards when trying to palpate lower abdomen.  Rovsing and McBurney's difficult to appreciate second to patient's cooperation.  Genitourinary:    Comments: Patient declined Neurological:     General: No focal deficit present.     Mental Status: He is alert and oriented to person, place, and time.  Psychiatric:        Mood and Affect: Mood is anxious.      UC Treatments / Results  Labs (all labs ordered are listed, but only abnormal results are displayed) Labs Reviewed - No data to display  EKG None  Radiology No results found.  Procedures Procedures (including critical care time)  Medications Ordered in UC Medications - No data to display  Initial Impression / Assessment and Plan / UC Course  I have reviewed the triage vital signs and the nursing notes.  Pertinent labs & imaging  results that were available during my care of the patient were reviewed by me and considered in my medical decision making (see chart for details).     Patient with history of prediabetes, arthritis, chronic pain with chronic opioid and laxative use presenting  for lower abdominal pain and burning.  He is afebrile and hypertensive on presentation.  Has vomited 3 times in the last 2 days without nausea, and had looser stools without significant change in frequency.  Patient was found to be guarding on exam as well as uncomfortable in exam chair, rocking, and visibly uncomfortable.  Due to patient's history and exam, he was referred to emergency department for further evaluation. Final Clinical Impressions(s) / UC Diagnoses   Final diagnoses:  Lower abdominal pain     Discharge Instructions     Patient with history of prediabetes, arthritis, chronic pain with chronic opioid and laxative use presenting for lower abdominal pain and burning.  He is afebrile and hypertensive on presentation.  Has vomited 3 times in the last 2 days without nausea, and had looser stools without significant change in frequency.  Patient was found to be guarding on exam as well as uncomfortable in exam chair, rocking, and visibly uncomfortable.  Due to patient's history and exam, he was referred to emergency department for further evaluation.    ED Prescriptions    None     Controlled Substance Prescriptions [place] Controlled Substance Registry consulted? Not Applicable   Shea Evans, [place] 04/01/19 1345

## 2019-04-01 NOTE — ED Notes (Signed)
Only able to  get 1 set of cultures

## 2019-04-01 NOTE — ED Notes (Signed)
ED Provider at bedside. 

## 2019-04-01 NOTE — Discharge Instructions (Signed)
Patient with history of prediabetes, arthritis, chronic pain with chronic opioid and laxative use presenting for lower abdominal pain and burning.  He is afebrile and hypertensive on presentation.  Has vomited 3 times in the last 2 days without nausea, and had looser stools without significant change in frequency.  Patient was found to be guarding on exam as well as uncomfortable in exam chair, rocking, and visibly uncomfortable.  Due to patient's history and exam, he was referred to emergency department for further evaluation.

## 2019-04-02 LAB — URINE CULTURE: Culture: <10000 — AB

## 2019-04-06 LAB — CULTURE, BLOOD (ROUTINE X 2)
Culture: NO GROWTH
Special Requests: ADEQUATE

## 2019-04-15 NOTE — Progress Notes (Signed)
Subjective:    Patient ID: Parker Daniel, male    DOB: 1956-10-04, 63 y.o.   MRN: [id number]  HPI:  Parker Daniel is here to establish as a new pt.  He is a pleasant 63 year old male. PMH:HTN, T2D, GERD, Chronic narcotic use  Recent ED visits for pneumonia and acute GI issues: 04/01/2019 CT abd/pelvis:  IMPRESSION:  1. Multifocal bibasilar pneumonia.    2. Nonobstructing calculi in each kidney, more and larger on the  right than on the left. No hydronephrosis or ureteral calculus on  either side. Urinary bladder wall thickness normal. Several  prostatic calculi noted.    3. No bowel obstruction. No abscess in the abdomen or pelvis.  Appendix absent. No periappendiceal region inflammation.    4. Advanced arthropathy left hip joint.    5. Gallbladder absent.    6. Aortic and iliac artery atherosclerosis.   Patient was reassessed resting comfortably no acute distress.  Work-up today expanded, blood cultures, lactic, chest x-ray, COVID-19 test, azithromycin and Rocephin ordered. - Patient reassessed multiple times required multiple doses of pain medicine for his abdominal pain while here in the emergency department he is a chronic narcotic medication user multiple pain medication doses here.  Vital signs have remained stable he is without chest pain, shortness of breath, cough. - Lactic 1.0 COVID-19 negative Blood cultures pending Urine culture pending Azithromycin and Rocephin administered GI cocktail given - Case rediscussed with Dr. Clarice Pole who is seeing the patient. - Plan at this time is to provide patient with 40 mg Protonix IV, discharged with doxycycline 100 mg twice daily x7 days, self quarantine x2 weeks, strict return precautions and PCP follow-up. - Patient ambulated with nursing staff without hypoxia or tachycardia on room air. - Patient's pain and nausea controlled states that he is feeling well and is requesting discharge.  Patient is aware of diagnoses  today including incidental findings and states understanding to follow-up with PCP and return to emergency department for any new or worsening symptoms.  Doxycycline 100 mg twice daily x7 days prescribed additional Protonix prescription given for possible GERD-like symptoms causing abdominal pain.  Suspect referred pain from his pneumonia may be contributing to his abdominal pain, he is without chest pain, cough or shortness of breath and is afebrile here no tachycardia or hypoxia on room air he has been ambulated without event there is no indication for admission at this time patient is overall well-appearing in no acute distress.  I have discussed with the patient that despite negative COVID-19 testing today that this may be a false negative and that he should still practice self quarantine for the next 2 weeks follow-up with PCP via tele-visit and avoid potential spread.  He states understanding of plan of care and is agreeable.  At this time there does not appear to be any evidence of an acute emergency medical condition and the patient appears stable for discharge with appropriate outpatient follow up. Diagnosis was discussed with patient who verbalizes understanding of care plan and is agreeable to discharge. I have discussed return precautions with patient who verbalizes understanding of return precautions. Patient encouraged to follow-up with their PCP. All questions answered.  Patient has been discharged in good condition.  Patient's case rediscussed with Dr. Donnald Garre who agrees with plan to discharge with doxycycline, Protonix and outpatient follow-up.   AFTER LONG DISCUSSION IT WAS DETERMINE THAT PT WILL KEEP HIS PCP IN [place] UNTIL HE PERMANENTLY MOVES TO LOCAL AREA  OV CANCELLED  Patient Care Team    Relationship Specialty Notifications Start End  Patient, No Pcp Per PCP - [facility]  04/01/19     There are no active problems to display for this patient.    Past  Medical History:  Diagnosis Date  . Arthritis   . Diabetes mellitus without complication (HCC)   . Gout   . Hypertension      Past Surgical History:  Procedure Laterality Date  . KNEE SURGERY       No family history on file.   Social History   Substance and Sexual Activity  Drug Use Not on file     Social History   Substance and Sexual Activity  Alcohol Use Not Currently     Social History   Tobacco Use  Smoking Status Never Smoker  Smokeless Tobacco Never Used     Outpatient Encounter Medications as of 04/17/2019  Medication Sig Note  . allopurinol (ZYLOPRIM) 300 MG tablet Take 300 mg by mouth daily.   Marland Kitchen. gabapentin (NEURONTIN) 300 MG capsule Take 600 mg by mouth 3 (three) times daily.   Marland Kitchen. lisinopril (ZESTRIL) 20 MG tablet Take 20 mg by mouth daily.   . metFORMIN (GLUCOPHAGE) 500 MG tablet Take 500 mg by mouth 2 (two) times daily with a meal.   . naproxen sodium (ALEVE) 220 MG tablet Take 220 mg by mouth daily as needed (pain).    Marland Kitchen. oxyCODONE (OXYCONTIN) 20 mg 12 hr tablet Take 20 mg by mouth every 12 (twelve) hours. 04/01/2019: PMP 03/22/2019 #60/30 DS  . oxyCODONE (ROXICODONE) 15 MG immediate release tablet Take 15 mg by mouth 2 (two) times daily as needed for pain. 04/01/2019: PMP 03/24/2019 #60/30 DS  . pantoprazole (PROTONIX) 20 MG tablet Take 1 tablet (20 mg total) by mouth daily.    No facility-administered encounter medications on file as of 04/17/2019.     Allergies: Patient has no known allergies.  There is no height or weight on file to calculate BMI.  There were no vitals taken for this visit.     Review of Systems     Objective:   Physical Exam        Assessment & Plan:  No diagnosis found.  No problem-specific Assessment & Plan notes found for this encounter.    FOLLOW-UP:  No follow-ups on file.

## 2019-04-17 ENCOUNTER — Ambulatory Visit (INDEPENDENT_AMBULATORY_CARE_PROVIDER_SITE_OTHER): Payer: Medicare HMO | Admitting: Adult Health

## 2019-04-17 ENCOUNTER — Encounter: Payer: Self-pay | Admitting: Adult Health

## 2019-04-17 ENCOUNTER — Other Ambulatory Visit: Payer: Self-pay

## 2019-04-17 VITALS — BP 134/85 | HR 78 | Temp 98.2°F | Ht 71.0 in | Wt 226.0 lb

## 2019-04-17 DIAGNOSIS — Z0000 Encounter for general adult medical examination without abnormal findings: Secondary | ICD-10-CM

## 2019-04-22 ENCOUNTER — Other Ambulatory Visit: Payer: Self-pay

## 2019-04-22 ENCOUNTER — Ambulatory Visit (INDEPENDENT_AMBULATORY_CARE_PROVIDER_SITE_OTHER): Payer: Medicare HMO | Admitting: Orthopedic Surgery

## 2019-04-22 ENCOUNTER — Telehealth: Payer: Self-pay

## 2019-04-22 ENCOUNTER — Encounter: Payer: Self-pay | Admitting: Orthopedic Surgery

## 2019-04-22 ENCOUNTER — Ambulatory Visit (INDEPENDENT_AMBULATORY_CARE_PROVIDER_SITE_OTHER): Payer: Medicare HMO

## 2019-04-22 VITALS — Ht 71.0 in | Wt 226.0 lb

## 2019-04-22 DIAGNOSIS — M25552 Pain in left hip: Secondary | ICD-10-CM | POA: Diagnosis not present

## 2019-04-22 DIAGNOSIS — M1612 Unilateral primary osteoarthritis, left hip: Secondary | ICD-10-CM

## 2019-04-22 NOTE — Telephone Encounter (Signed)
This pt was supposed to see Dr. Ninfa Linden and was onour schedule today. He needs a total left hip and requested Dr. Ninfa Linden but it was not scheduled that way. He had xrays today and advised that he would like to set up ASAP. Can you please call pt and make an appt for him to be seen and wants to schedule surgery the day he is sen please.

## 2019-04-22 NOTE — Progress Notes (Signed)
Office Visit Note   Patient: Parker MedinaDavid D Burkitt           Date of Birth: [birth year]           MRN: [id number] Visit Date: 04/22/2019              Requested by: No referring provider defined for this encounter. PCP: Julaine Fusianford, Katy D, NP  Chief Complaint  Patient presents with  . Left Hip - Pain      HPI: Patient is a 63 year old gentleman who presents with 8 to 6667-month history of increasing left hip pain.  Patient complains of groin pain lateral pain pain that radiates down his leg.  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    Patient states that his brother has a total hip replacement performed by Dr. Magnus IvanBlackman.  Patient states he does not smoke he states he does have diabetes on metformin.  Assessment & Plan: Visit Diagnoses:  1. Pain in left hip   2. Unilateral primary osteoarthritis, left hip     Plan: Discussed with the patient his best option is to proceed with a total hip arthroplasty.  We will schedule follow-up appointment with Dr. Magnus IvanBlackman.  Follow-Up Instructions: No follow-ups on file.   Ortho Exam  Patient is alert, oriented, no adenopathy, well-dressed, normal affect, normal respiratory effort. Examination patient has difficulty getting from a sitting to a standing position he has an antalgic gait with an abductor lurch to the left.  He has essentially no internal or external rotation of the left hip he can flex his hip to 90 degrees.  He has no pain with range of motion of the right hip negative straight leg raise bilaterally with varus alignment of both knees with crepitation with range of motion of both knees.  Patient is on OxyContin 20 mg twice a day and oxycodone 15 mg as needed.  There is not a hemoglobin A1c value in the system.  Imaging: Xr Hip Unilat W Or W/o Pelvis 2-3 Views Left  Result Date: 04/22/2019 2 view radiographs of the left hip show shortening with complete collapse  with bone-on-bone contact periarticular cysts and bone spurs.  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  Patient has degenerative disc of the lumbar spine with collapse at L4-5 L5-S1.  No images are attached to the encounter.  Labs: Lab Results  Component Value Date   REPTSTATUS 04/06/2019 FINAL 04/01/2019   CULT  04/01/2019    NO GROWTH 5 DAYS Performed at [facility], [address] [place], [place] [address]      Lab Results  Component Value Date   ALBUMIN 3.4 (L) 04/01/2019    Body mass index is 31.52 kg/m.  Orders:  Orders Placed This Encounter  Procedures  . XR HIP UNILAT W OR W/O PELVIS 2-3 VIEWS LEFT   No orders of the defined types were placed in this encounter.    Procedures: No procedures performed  Clinical Data: No additional findings.  ROS:  All other systems negative, except as noted in the HPI. Review of Systems  Objective: Vital Signs: Ht 5\' 11"  (1.803 m)   Wt 226 lb (102.5 kg)   BMI 31.52 kg/m   Specialty Comments:  No specialty comments available.  PMFS History: Patient Active Problem List   Diagnosis Date Noted  . Unilateral primary  osteoarthritis, left hip 04/22/2019   Past Medical History:  Diagnosis Date  . Arthritis   . Diabetes mellitus without complication ([place])   . Gout   . Hyperlipidemia   . Hypertension     Family History  Problem Relation Age of Onset  . Cancer Father        bladder    Past Surgical History:  Procedure Laterality Date  . KNEE SURGERY     Social History   Occupational History  . Not on file  Tobacco Use  . Smoking status: Never Smoker  . Smokeless tobacco: Never Used  Substance and Sexual Activity  . Alcohol use: Not Currently  . Drug use: Never  . Sexual activity: Yes    Birth control/protection: None

## 2019-04-23 NOTE — Telephone Encounter (Signed)
Can you do me a favor and work him in for me it can be tomorrow on [employer]

## 2019-04-24 ENCOUNTER — Encounter: Payer: Self-pay | Admitting: Orthopaedic Surgery

## 2019-04-24 ENCOUNTER — Ambulatory Visit (INDEPENDENT_AMBULATORY_CARE_PROVIDER_SITE_OTHER): Payer: Medicare HMO | Admitting: Orthopaedic Surgery

## 2019-04-24 ENCOUNTER — Other Ambulatory Visit: Payer: Self-pay

## 2019-04-24 DIAGNOSIS — M1612 Unilateral primary osteoarthritis, left hip: Secondary | ICD-10-CM | POA: Diagnosis not present

## 2019-04-24 NOTE — Progress Notes (Signed)
Office Visit Note   Patient: Parker Daniel           Date of Birth: 1956/07/08           MRN: [id number] Visit Date: 04/24/2019              Requested by: Esaw Grandchild, NP [place],  [place] [id number] PCP: Esaw Grandchild, NP   Assessment & Plan: Visit Diagnoses:  1. Unilateral primary osteoarthritis, left hip     Plan: He does have severe end-stage arthritis of his left hip and we are recommending hip replacement surgery.  He does wish to proceed with this later this year.  I showed him his x-rays and went over hip model with him in detail.  I gave him a handout about hip replacement surgery.  We talked about his intraoperative and postoperative course.  We had a discussion about the risk and benefits of surgery as well.  He does have arthritis in his knees and wants these addressed at some point but his left hip this is more pressing issue.  All question concerns were answered addressed.  We will work on getting this scheduled for later this year.  Follow-Up Instructions: Return for 2 weeks post-op.   Orders:  No orders of the defined types were placed in this encounter.  No orders of the defined types were placed in this encounter.     Procedures: No procedures performed   Clinical Data: No additional findings.   Subjective: Chief Complaint  Patient presents with   Left Hip - Pain, Follow-up  The patient comes in with several years of worsening left hip pain.  He is actually seen 1 of my partners recently and x-rays were obtained showing complete loss of left hip joint.  There are sclerotic changes and cystic changes in the femoral head and acetabulum.  And again the space is completely lost.  His pain is daily and it is 10 out of 10.  His left hip pain is detrimentally affected his mobility, his quality of life and his activities daily living.  It is at the point he does wish to proceed with total hip arthroplasty later this summer.  He has talked with  his family about this.  I have actually replaced his brother set before.  This was also recommended by another orthopedic surgeon.  Again he has had conservative treatment for well over a year.  There is no other conservative treatment left for the severity of his arthritis.  He walks with a significant limp to his gait.  HPI  Review of Systems He currently denies any headache, chest pain, shortness of breath, fever, chills, nausea, vomiting  Objective: Vital Signs: There were no vitals taken for this visit.  Physical Exam He is alert and orient x3 and in no acute distress Ortho Exam Examination of his left hip essentially shows no range of motion at all.  His right hip moves normally.  His left hip is significantly stiff.  He has a leg length discrepancy with his left side slightly shorter than his right.  He has quite a significant Trendelenburg gait. Specialty Comments:  No specialty comments available.  Imaging: No results found. An AP pelvis and lateral of the left hip shows severe cystic changes in the femoral head and acetabulum.  He has complete loss of joint space with flattening of the femoral head.  PMFS History: Patient Active Problem List   Diagnosis Date Noted  Unilateral primary osteoarthritis, left hip 04/22/2019   Past Medical History:  Diagnosis Date   Arthritis    Diabetes mellitus without complication ([facility])    Gout    Hyperlipidemia    Hypertension     Family History  Problem Relation Age of Onset   Cancer Father        bladder    Past Surgical History:  Procedure Laterality Date   KNEE SURGERY     Social History   Occupational History   Not on file  Tobacco Use   Smoking status: Never Smoker   Smokeless tobacco: Never Used  Substance and Sexual Activity   Alcohol use: Not Currently   Drug use: Never   Sexual activity: Yes    Birth control/protection: None

## 2019-06-09 ENCOUNTER — Ambulatory Visit: Payer: Medicare HMO | Admitting: Orthopaedic Surgery

## 2019-06-11 ENCOUNTER — Other Ambulatory Visit: Payer: Self-pay

## 2019-06-19 ENCOUNTER — Other Ambulatory Visit: Payer: Self-pay | Admitting: Physician Assistant

## 2019-06-23 NOTE — Patient Instructions (Addendum)
YOU HAVE HAD A COVID 19 TEST. ONCE YOUR COVID TEST IS COMPLETED, PLEASE BEGIN THE QUARANTINE INSTRUCTIONS AS OUTLINED IN YOUR HANDOUT.                Parker Daniel  06/23/2019   Your procedure is scheduled on: 06-27-19    Report to [facility] Main  Entrance    Report to Admitting at 5:30 AM   1 VISITOR IS ALLOWED TO WAIT IN WAITING ROOM  ONLY DAY OF YOUR SURGERY.    Call this number if you have problems the morning of surgery [phone]    Remember:NO SOLID FOOD AFTER MIDNIGHT. NOTHING EXCEPT CLEAR LIQUIDS. PLEASE FINISH G2 DRINK PER SURGEON ORDER  AT  4:15 AM.   CLEAR LIQUID DIET   Foods Allowed                                                                     Foods Excluded  Coffee and tea, regular and decaf                             liquids that you cannot  Plain Jell-O any favor except red or purple                                           see through such as: Fruit ices (not with fruit pulp)                                     milk, soups, orange juice  Iced Popsicles                                    All solid food Carbonated beverages, regular and diet                                    Cranberry, grape and apple juices Sports drinks like Gatorade Lightly seasoned clear broth or consume(fat free) Sugar, honey syrup  _____________________________________________________________________     Take these medicines the morning of surgery with A SIP OF WATER: Allopurinol (Zyloprim), and Gabapentin (Neurontin)  BRUSH YOUR TEETH MORNING OF SURGERY AND RINSE YOUR MOUTH OUT, NO CHEWING GUM CANDY OR MINTS.                               You may not have any metal on your body including hair pins and              piercings     Do not wear jewelry, cologne, lotions, powders or, deodorant                 Men may shave face and neck.   Do not bring valuables to the hospital. [facility] IS NOT  RESPONSIBLE   FOR VALUABLES.  Contacts, dentures or  bridgework may not be worn into surgery.  Leave suitcase in the car. After surgery it may be brought to your room.    :  Special Instructions: N/A              Please read over the following fact sheets you were given: _____________________________________________________________________             [facility] - Preparing for Surgery Before surgery, you can play an important role.  Because skin is not sterile, your skin needs to be as free of germs as possible.  You can reduce the number of germs on your skin by washing with CHG (chlorahexidine gluconate) soap before surgery.  CHG is an antiseptic cleaner which kills germs and bonds with the skin to continue killing germs even after washing. Please DO NOT use if you have an allergy to CHG or antibacterial soaps.  If your skin becomes reddened/irritated stop using the CHG and inform your nurse when you arrive at Short Stay. Do not shave (including legs and underarms) for at least 48 hours prior to the first CHG shower.  You may shave your face/neck. Please follow these instructions carefully:  1.  Shower with CHG Soap the night before surgery and the  morning of Surgery.  2.  If you choose to wash your hair, wash your hair first as usual with your  normal  shampoo.  3.  After you shampoo, rinse your hair and body thoroughly to remove the  shampoo.                           4.  Use CHG as you would any other liquid soap.  You can apply chg directly  to the skin and wash                       Gently with a scrungie or clean washcloth.  5.  Apply the CHG Soap to your body ONLY FROM THE NECK DOWN.   Do not use on face/ open                           Wound or open sores. Avoid contact with eyes, ears mouth and genitals (private parts).                       Wash face,  Genitals (private parts) with your normal soap.             6.  Wash thoroughly, paying special attention to the area where your surgery  will be performed.  7.  Thoroughly rinse  your body with warm water from the neck down.  8.  DO NOT shower/wash with your normal soap after using and rinsing off  the CHG Soap.                9.  Pat yourself dry with a clean towel.            10.  Wear clean pajamas.            11.  Place clean sheets on your bed the night of your first shower and do not  sleep with pets. Day of Surgery : Do not apply any lotions/deodorants the morning of surgery.  Please wear clean clothes to the hospital/surgery center.  FAILURE TO  FOLLOW THESE INSTRUCTIONS MAY RESULT IN THE CANCELLATION OF YOUR SURGERY PATIENT SIGNATURE_________________________________  NURSE SIGNATURE__________________________________  ________________________________________________________________________

## 2019-06-24 ENCOUNTER — Other Ambulatory Visit: Payer: Self-pay

## 2019-06-24 ENCOUNTER — Encounter (HOSPITAL_COMMUNITY): Payer: Self-pay

## 2019-06-24 ENCOUNTER — Encounter (HOSPITAL_COMMUNITY)
Admission: RE | Admit: 2019-06-24 | Discharge: 2019-06-24 | Disposition: A | Discharge status: Home / Self Care | Payer: Medicare Other | Source: Ambulatory Visit | Attending: Orthopaedic Surgery | Admitting: Orthopaedic Surgery

## 2019-06-24 ENCOUNTER — Other Ambulatory Visit (HOSPITAL_COMMUNITY)
Admission: RE | Admit: 2019-06-24 | Discharge: 2019-06-24 | Disposition: A | Discharge status: Home / Self Care | Payer: Medicare Other | Source: Ambulatory Visit | Attending: Orthopaedic Surgery | Admitting: Orthopaedic Surgery

## 2019-06-24 DIAGNOSIS — Z8052 Family history of malignant neoplasm of bladder: Secondary | ICD-10-CM | POA: Diagnosis not present

## 2019-06-24 DIAGNOSIS — M25752 Osteophyte, left hip: Secondary | ICD-10-CM | POA: Diagnosis not present

## 2019-06-24 DIAGNOSIS — M24561 Contracture, right knee: Secondary | ICD-10-CM | POA: Diagnosis not present

## 2019-06-24 DIAGNOSIS — Z01812 Encounter for preprocedural laboratory examination: Secondary | ICD-10-CM | POA: Insufficient documentation

## 2019-06-24 DIAGNOSIS — E785 Hyperlipidemia, unspecified: Secondary | ICD-10-CM | POA: Diagnosis not present

## 2019-06-24 DIAGNOSIS — I10 Essential (primary) hypertension: Secondary | ICD-10-CM | POA: Diagnosis not present

## 2019-06-24 DIAGNOSIS — Z20828 Contact with and (suspected) exposure to other viral communicable diseases: Secondary | ICD-10-CM | POA: Diagnosis not present

## 2019-06-24 DIAGNOSIS — M1612 Unilateral primary osteoarthritis, left hip: Secondary | ICD-10-CM | POA: Diagnosis present

## 2019-06-24 DIAGNOSIS — M1711 Unilateral primary osteoarthritis, right knee: Secondary | ICD-10-CM | POA: Diagnosis not present

## 2019-06-24 DIAGNOSIS — E119 Type 2 diabetes mellitus without complications: Secondary | ICD-10-CM | POA: Diagnosis not present

## 2019-06-24 DIAGNOSIS — D649 Anemia, unspecified: Secondary | ICD-10-CM | POA: Diagnosis not present

## 2019-06-24 DIAGNOSIS — Z79899 Other long term (current) drug therapy: Secondary | ICD-10-CM | POA: Diagnosis not present

## 2019-06-24 DIAGNOSIS — M109 Gout, unspecified: Secondary | ICD-10-CM | POA: Diagnosis not present

## 2019-06-24 LAB — BASIC METABOLIC PANEL
Anion gap: 9 (ref 5–15)
BUN: 14 mg/dL (ref 8–23)
CO2: 23 mmol/L (ref 22–32)
Calcium: 9 mg/dL (ref 8.9–10.3)
Chloride: 107 mmol/L (ref 98–111)
Creatinine, Ser: 1.13 mg/dL (ref 0.61–1.24)
GFR calc Af Amer: >60 mL/min (ref >60)
GFR calc non Af Amer: >60 mL/min (ref >60)
Glucose, Bld: 86 mg/dL (ref 70–99)
Potassium: 4.1 mmol/L (ref 3.5–5.1)
Sodium: 139 mmol/L (ref 135–145)

## 2019-06-24 LAB — SURGICAL PCR SCREEN
MRSA, PCR: NEGATIVE
Staphylococcus aureus: NEGATIVE

## 2019-06-24 LAB — HEMOGLOBIN A1C
Hgb A1c MFr Bld: 5.5 % (ref 4.8–5.6)
Mean Plasma Glucose: 111.15 mg/dL

## 2019-06-24 LAB — CBC
HCT: 38.3 % — ABNORMAL LOW (ref 39.0–52.0)
Hemoglobin: 11.7 g/dL — ABNORMAL LOW (ref 13.0–17.0)
MCH: 30.2 pg (ref 26.0–34.0)
MCHC: 30.5 g/dL (ref 30.0–36.0)
MCV: 98.7 fL (ref 80.0–100.0)
Platelets: 213 10*3/uL (ref 150–400)
RBC: 3.88 MIL/uL — ABNORMAL LOW (ref 4.22–5.81)
RDW: 15.5 % (ref 11.5–15.5)
WBC: 5.6 10*3/uL (ref 4.0–10.5)
nRBC: 0 % (ref 0.0–0.2)

## 2019-06-24 LAB — SARS CORONAVIRUS 2 (TAT 6-24 HRS): SARS Coronavirus 2: NEGATIVE

## 2019-06-24 NOTE — Progress Notes (Signed)
04-02-19 EKG (Epic)  04-01-19 CXR (Epic)

## 2019-06-26 NOTE — Anesthesia Preprocedure Evaluation (Addendum)
Anesthesia Evaluation  Patient identified by MRN, date of birth, ID band Patient awake    Reviewed: Allergy & Precautions, NPO status , Patient's Chart, lab work & pertinent test results  Airway Mallampati: II  TM Distance: >3 FB Neck ROM: Full    Dental  (+) Dental Advisory Given   Pulmonary neg pulmonary ROS,    breath sounds clear to auscultation       Cardiovascular hypertension, Pt. on medications  Rhythm:Regular Rate:Normal     Neuro/Psych negative neurological ROS     GI/Hepatic negative GI ROS, Neg liver ROS,   Endo/Other  diabetes  Renal/GU negative Renal ROS     Musculoskeletal  (+) Arthritis ,   Abdominal   Peds  Hematology  (+) anemia ,   Anesthesia Other Findings   Reproductive/Obstetrics                            Lab Results  Component Value Date   WBC 5.6 06/24/2019   HGB 11.7 (L) 06/24/2019   HCT 38.3 (L) 06/24/2019   MCV 98.7 06/24/2019   PLT 213 06/24/2019   Lab Results  Component Value Date   CREATININE 1.13 06/24/2019   BUN 14 06/24/2019   NA 139 06/24/2019   K 4.1 06/24/2019   CL 107 06/24/2019   CO2 23 06/24/2019    Anesthesia Physical Anesthesia Plan  ASA: II  Anesthesia Plan: Spinal   Post-op Pain Management:    Induction:   PONV Risk Score and Plan: 1 and Propofol infusion, Ondansetron and Treatment may vary due to age or medical condition  Airway Management Planned: Natural Airway and Simple Face Mask  Additional Equipment:   Intra-op Plan:   Post-operative Plan:   Informed Consent: I have reviewed the patients History and Physical, chart, labs and discussed the procedure including the risks, benefits and alternatives for the proposed anesthesia with the patient or authorized representative who has indicated his/her understanding and acceptance.       Plan Discussed with: CRNA  Anesthesia Plan Comments:        Anesthesia  Quick Evaluation

## 2019-06-27 ENCOUNTER — Other Ambulatory Visit: Payer: Self-pay

## 2019-06-27 ENCOUNTER — Inpatient Hospital Stay (HOSPITAL_COMMUNITY): Payer: Medicare Other

## 2019-06-27 ENCOUNTER — Ambulatory Visit (HOSPITAL_COMMUNITY): Payer: Medicare Other

## 2019-06-27 ENCOUNTER — Ambulatory Visit (HOSPITAL_COMMUNITY): Payer: Medicare Other | Admitting: Anesthesiology

## 2019-06-27 ENCOUNTER — Ambulatory Visit (HOSPITAL_COMMUNITY): Payer: Medicare Other | Admitting: Physician Assistant

## 2019-06-27 ENCOUNTER — Inpatient Hospital Stay (HOSPITAL_COMMUNITY)
Admission: AD | Admit: 2019-06-27 | Discharge: 2019-06-29 | DRG: 470 | Disposition: A | Discharge status: Home Health | Payer: Medicare Other | Attending: Orthopaedic Surgery | Admitting: Orthopaedic Surgery

## 2019-06-27 ENCOUNTER — Encounter (HOSPITAL_COMMUNITY): Payer: Self-pay | Admitting: Anesthesiology

## 2019-06-27 ENCOUNTER — Encounter (HOSPITAL_COMMUNITY)
Admission: AD | Disposition: A | Discharge status: Home / Self Care | Payer: Self-pay | Source: Home / Self Care | Attending: Orthopaedic Surgery

## 2019-06-27 DIAGNOSIS — D649 Anemia, unspecified: Secondary | ICD-10-CM | POA: Diagnosis not present

## 2019-06-27 DIAGNOSIS — M1612 Unilateral primary osteoarthritis, left hip: Principal | ICD-10-CM | POA: Diagnosis present

## 2019-06-27 DIAGNOSIS — Z96642 Presence of left artificial hip joint: Secondary | ICD-10-CM

## 2019-06-27 DIAGNOSIS — M25752 Osteophyte, left hip: Secondary | ICD-10-CM | POA: Diagnosis present

## 2019-06-27 DIAGNOSIS — M1711 Unilateral primary osteoarthritis, right knee: Secondary | ICD-10-CM | POA: Diagnosis not present

## 2019-06-27 DIAGNOSIS — I10 Essential (primary) hypertension: Secondary | ICD-10-CM | POA: Diagnosis not present

## 2019-06-27 DIAGNOSIS — E119 Type 2 diabetes mellitus without complications: Secondary | ICD-10-CM | POA: Diagnosis not present

## 2019-06-27 DIAGNOSIS — Z20828 Contact with and (suspected) exposure to other viral communicable diseases: Secondary | ICD-10-CM | POA: Diagnosis present

## 2019-06-27 DIAGNOSIS — E785 Hyperlipidemia, unspecified: Secondary | ICD-10-CM | POA: Diagnosis present

## 2019-06-27 DIAGNOSIS — Z8052 Family history of malignant neoplasm of bladder: Secondary | ICD-10-CM

## 2019-06-27 DIAGNOSIS — M109 Gout, unspecified: Secondary | ICD-10-CM | POA: Diagnosis present

## 2019-06-27 DIAGNOSIS — Z419 Encounter for procedure for purposes other than remedying health state, unspecified: Secondary | ICD-10-CM

## 2019-06-27 DIAGNOSIS — M24561 Contracture, right knee: Secondary | ICD-10-CM | POA: Diagnosis present

## 2019-06-27 DIAGNOSIS — Z79899 Other long term (current) drug therapy: Secondary | ICD-10-CM | POA: Diagnosis not present

## 2019-06-27 DIAGNOSIS — Z9889 Other specified postprocedural states: Secondary | ICD-10-CM

## 2019-06-27 HISTORY — PX: TOTAL HIP ARTHROPLASTY: SHX124

## 2019-06-27 LAB — GLUCOSE, CAPILLARY: Glucose-Capillary: 82 mg/dL (ref 70–99)

## 2019-06-27 SURGERY — ARTHROPLASTY, HIP, TOTAL, ANTERIOR APPROACH
Anesthesia: Spinal | Site: Hip | Laterality: Left

## 2019-06-27 MED ORDER — ASPIRIN 81 MG PO CHEW
81.0000 mg | CHEWABLE_TABLET | Freq: Two times a day (BID) | ORAL | Status: DC
  Administered 2019-06-27 – 2019-06-29 (×4): 81 mg via ORAL
  Filled 2019-06-27 (×4): qty 1

## 2019-06-27 MED ORDER — MIDAZOLAM HCL 2 MG/2ML IJ SOLN
INTRAMUSCULAR | Status: AC
  Filled 2019-06-27: qty 2

## 2019-06-27 MED ORDER — OXYCODONE HCL 5 MG PO TABS
10.0000 mg | ORAL_TABLET | ORAL | Status: DC | PRN
  Administered 2019-06-27: 10 mg via ORAL
  Administered 2019-06-28 (×2): 15 mg via ORAL
  Administered 2019-06-28 – 2019-06-29 (×2): 10 mg via ORAL
  Administered 2019-06-29: 15 mg via ORAL
  Filled 2019-06-27: qty 2
  Filled 2019-06-27: qty 3

## 2019-06-27 MED ORDER — MENTHOL 3 MG MT LOZG: 1.0000 | LOZENGE | OROMUCOSAL | Status: DC | PRN

## 2019-06-27 MED ORDER — MIDAZOLAM HCL 5 MG/5ML IJ SOLN
INTRAMUSCULAR | Status: DC | PRN
  Administered 2019-06-27: 2 mg via INTRAVENOUS

## 2019-06-27 MED ORDER — LACTATED RINGERS IV SOLN
INTRAVENOUS | Status: DC | PRN
  Administered 2019-06-27 (×2): via INTRAVENOUS

## 2019-06-27 MED ORDER — METHOCARBAMOL 500 MG IVPB - SIMPLE MED
INTRAVENOUS | Status: AC
  Filled 2019-06-27: qty 50

## 2019-06-27 MED ORDER — PROMETHAZINE HCL 25 MG/ML IJ SOLN: 6.2500 mg | INTRAMUSCULAR | Status: DC | PRN

## 2019-06-27 MED ORDER — HYDROMORPHONE HCL 1 MG/ML IJ SOLN
1.0000 mg | INTRAMUSCULAR | Status: DC | PRN
  Administered 2019-06-27 (×3): 1 mg via INTRAVENOUS
  Administered 2019-06-28 (×2): 2 mg via INTRAVENOUS
  Administered 2019-06-29: 1 mg via INTRAVENOUS
  Filled 2019-06-27 (×4): qty 1
  Filled 2019-06-27 (×2): qty 2

## 2019-06-27 MED ORDER — ONDANSETRON HCL 4 MG/2ML IJ SOLN
INTRAMUSCULAR | Status: DC | PRN
  Administered 2019-06-27: 4 mg via INTRAVENOUS

## 2019-06-27 MED ORDER — HYDROMORPHONE HCL 1 MG/ML IJ SOLN: 0.2500 mg | INTRAMUSCULAR | Status: DC | PRN

## 2019-06-27 MED ORDER — CEFAZOLIN SODIUM-DEXTROSE 1-4 GM/50ML-% IV SOLN
1.0000 g | Freq: Four times a day (QID) | INTRAVENOUS | Status: AC
  Administered 2019-06-27 (×2): 1 g via INTRAVENOUS
  Filled 2019-06-27 (×2): qty 50

## 2019-06-27 MED ORDER — SODIUM CHLORIDE 0.9 % IV SOLN
INTRAVENOUS | Status: DC
  Administered 2019-06-27: 75 mL/h via INTRAVENOUS
  Administered 2019-06-28: 05:00:00 via INTRAVENOUS

## 2019-06-27 MED ORDER — PROPOFOL 10 MG/ML IV BOLUS
INTRAVENOUS | Status: AC
  Filled 2019-06-27: qty 60

## 2019-06-27 MED ORDER — LIDOCAINE 2% (20 MG/ML) 5 ML SYRINGE
INTRAMUSCULAR | Status: AC
  Filled 2019-06-27: qty 5

## 2019-06-27 MED ORDER — FENTANYL CITRATE (PF) 100 MCG/2ML IJ SOLN
INTRAMUSCULAR | Status: AC
  Filled 2019-06-27: qty 2

## 2019-06-27 MED ORDER — GABAPENTIN 300 MG PO CAPS
600.0000 mg | ORAL_CAPSULE | Freq: Three times a day (TID) | ORAL | Status: DC
  Administered 2019-06-27 – 2019-06-29 (×6): 600 mg via ORAL
  Filled 2019-06-27 (×6): qty 2

## 2019-06-27 MED ORDER — TRANEXAMIC ACID-NACL 1000-0.7 MG/100ML-% IV SOLN
1000.0000 mg | INTRAVENOUS | Status: AC
  Administered 2019-06-27: 1000 mg via INTRAVENOUS
  Filled 2019-06-27: qty 100

## 2019-06-27 MED ORDER — ONDANSETRON HCL 4 MG/2ML IJ SOLN
INTRAMUSCULAR | Status: AC
  Filled 2019-06-27: qty 2

## 2019-06-27 MED ORDER — FENTANYL CITRATE (PF) 100 MCG/2ML IJ SOLN
INTRAMUSCULAR | Status: DC | PRN
  Administered 2019-06-27: 100 ug via INTRAVENOUS

## 2019-06-27 MED ORDER — METOCLOPRAMIDE HCL 5 MG PO TABS: 5.0000 mg | ORAL_TABLET | Freq: Three times a day (TID) | ORAL | Status: DC | PRN

## 2019-06-27 MED ORDER — KETOROLAC TROMETHAMINE 15 MG/ML IJ SOLN
15.0000 mg | Freq: Four times a day (QID) | INTRAMUSCULAR | Status: AC
  Administered 2019-06-27 – 2019-06-28 (×3): 15 mg via INTRAVENOUS
  Filled 2019-06-27 (×3): qty 1

## 2019-06-27 MED ORDER — PHENOL 1.4 % MT LIQD
1.0000 | OROMUCOSAL | Status: DC | PRN
  Filled 2019-06-27: qty 177

## 2019-06-27 MED ORDER — 0.9 % SODIUM CHLORIDE (POUR BTL) OPTIME
TOPICAL | Status: DC | PRN
  Administered 2019-06-27: 1000 mL

## 2019-06-27 MED ORDER — CHLORHEXIDINE GLUCONATE 4 % EX LIQD: 60.0000 mL | Freq: Once | CUTANEOUS | Status: DC

## 2019-06-27 MED ORDER — METOCLOPRAMIDE HCL 5 MG/ML IJ SOLN: 5.0000 mg | Freq: Three times a day (TID) | INTRAMUSCULAR | Status: DC | PRN

## 2019-06-27 MED ORDER — POLYETHYLENE GLYCOL 3350 17 G PO PACK: 17.0000 g | PACK | Freq: Every day | ORAL | Status: DC | PRN

## 2019-06-27 MED ORDER — PROPOFOL 10 MG/ML IV BOLUS
INTRAVENOUS | Status: DC | PRN
  Administered 2019-06-27 (×2): 20 mg via INTRAVENOUS
  Administered 2019-06-27: 10 mg via INTRAVENOUS
  Administered 2019-06-27: 20 mg via INTRAVENOUS

## 2019-06-27 MED ORDER — DEXAMETHASONE SODIUM PHOSPHATE 10 MG/ML IJ SOLN
INTRAMUSCULAR | Status: AC
  Filled 2019-06-27: qty 1

## 2019-06-27 MED ORDER — ALLOPURINOL 300 MG PO TABS
300.0000 mg | ORAL_TABLET | Freq: Every day | ORAL | Status: DC
  Administered 2019-06-28 – 2019-06-29 (×2): 300 mg via ORAL
  Filled 2019-06-27 (×2): qty 1

## 2019-06-27 MED ORDER — ALUM & MAG HYDROXIDE-SIMETH 200-200-20 MG/5ML PO SUSP: 30.0000 mL | ORAL | Status: DC | PRN

## 2019-06-27 MED ORDER — DEXAMETHASONE SODIUM PHOSPHATE 10 MG/ML IJ SOLN
INTRAMUSCULAR | Status: DC | PRN
  Administered 2019-06-27: 10 mg via INTRAVENOUS

## 2019-06-27 MED ORDER — ACETAMINOPHEN 325 MG PO TABS: 325.0000 mg | ORAL_TABLET | Freq: Four times a day (QID) | ORAL | Status: DC | PRN

## 2019-06-27 MED ORDER — SODIUM CHLORIDE 0.9 % IR SOLN
Status: DC | PRN
  Administered 2019-06-27: 1000 mL

## 2019-06-27 MED ORDER — OXYCODONE HCL ER 20 MG PO T12A
20.0000 mg | EXTENDED_RELEASE_TABLET | Freq: Two times a day (BID) | ORAL | Status: DC
  Administered 2019-06-27 – 2019-06-29 (×4): 20 mg via ORAL
  Filled 2019-06-27 (×4): qty 1

## 2019-06-27 MED ORDER — METHOCARBAMOL 500 MG IVPB - SIMPLE MED
500.0000 mg | Freq: Four times a day (QID) | INTRAVENOUS | Status: DC | PRN
  Administered 2019-06-27: 500 mg via INTRAVENOUS
  Filled 2019-06-27: qty 50

## 2019-06-27 MED ORDER — METHOCARBAMOL 500 MG PO TABS
500.0000 mg | ORAL_TABLET | Freq: Four times a day (QID) | ORAL | Status: DC | PRN
  Administered 2019-06-27 – 2019-06-29 (×6): 500 mg via ORAL
  Filled 2019-06-27 (×6): qty 1

## 2019-06-27 MED ORDER — FERROUS SULFATE 325 (65 FE) MG PO TABS
325.0000 mg | ORAL_TABLET | Freq: Every day | ORAL | Status: DC
  Administered 2019-06-28 – 2019-06-29 (×2): 325 mg via ORAL
  Filled 2019-06-27 (×2): qty 1

## 2019-06-27 MED ORDER — ONDANSETRON HCL 4 MG/2ML IJ SOLN: 4.0000 mg | Freq: Four times a day (QID) | INTRAMUSCULAR | Status: DC | PRN

## 2019-06-27 MED ORDER — STERILE WATER FOR IRRIGATION IR SOLN
Status: DC | PRN
  Administered 2019-06-27: 2000 mL

## 2019-06-27 MED ORDER — DOCUSATE SODIUM 100 MG PO CAPS
100.0000 mg | ORAL_CAPSULE | Freq: Two times a day (BID) | ORAL | Status: DC
  Administered 2019-06-27 – 2019-06-29 (×4): 100 mg via ORAL
  Filled 2019-06-27 (×4): qty 1

## 2019-06-27 MED ORDER — POVIDONE-IODINE 10 % EX SWAB: 2.0000 "application " | Freq: Once | CUTANEOUS | Status: DC

## 2019-06-27 MED ORDER — OXYCODONE HCL 5 MG PO TABS
5.0000 mg | ORAL_TABLET | ORAL | Status: DC | PRN
  Administered 2019-06-27: 15 mg via ORAL
  Administered 2019-06-29: 10 mg via ORAL
  Filled 2019-06-27: qty 2
  Filled 2019-06-27 (×2): qty 3
  Filled 2019-06-27: qty 2
  Filled 2019-06-27: qty 3
  Filled 2019-06-27: qty 2

## 2019-06-27 MED ORDER — PROPOFOL 500 MG/50ML IV EMUL
INTRAVENOUS | Status: DC | PRN
  Administered 2019-06-27: 100 ug/kg/min via INTRAVENOUS

## 2019-06-27 MED ORDER — ONDANSETRON HCL 4 MG PO TABS: 4.0000 mg | ORAL_TABLET | Freq: Four times a day (QID) | ORAL | Status: DC | PRN

## 2019-06-27 MED ORDER — CEFAZOLIN SODIUM-DEXTROSE 2-4 GM/100ML-% IV SOLN
2.0000 g | INTRAVENOUS | Status: AC
  Administered 2019-06-27: 2 g via INTRAVENOUS
  Filled 2019-06-27: qty 100

## 2019-06-27 MED ORDER — PROPOFOL 10 MG/ML IV BOLUS
INTRAVENOUS | Status: AC
  Filled 2019-06-27: qty 20

## 2019-06-27 MED ORDER — ZOLPIDEM TARTRATE 5 MG PO TABS: 5.0000 mg | ORAL_TABLET | Freq: Every evening | ORAL | Status: DC | PRN

## 2019-06-27 MED ORDER — PRAVASTATIN SODIUM 20 MG PO TABS
40.0000 mg | ORAL_TABLET | Freq: Every day | ORAL | Status: DC
  Administered 2019-06-27 – 2019-06-28 (×2): 40 mg via ORAL
  Filled 2019-06-27 (×2): qty 2

## 2019-06-27 MED ORDER — PANTOPRAZOLE SODIUM 40 MG PO TBEC
40.0000 mg | DELAYED_RELEASE_TABLET | Freq: Every day | ORAL | Status: DC
  Administered 2019-06-27 – 2019-06-29 (×3): 40 mg via ORAL
  Filled 2019-06-27 (×3): qty 1

## 2019-06-27 MED ORDER — BUPIVACAINE IN DEXTROSE 0.75-8.25 % IT SOLN
INTRATHECAL | Status: DC | PRN
  Administered 2019-06-27: 2 mL via INTRATHECAL

## 2019-06-27 SURGICAL SUPPLY — 49 items
ACETAB CUP W GRIPTION 54MM (Plate) ×1 IMPLANT
ACETAB CUP W/GRIPTION 54 (Plate) ×2 IMPLANT
APL SKNCLS STERI-STRIP NONHPOA (GAUZE/BANDAGES/DRESSINGS) ×1
BAG SPEC THK2 15X12 ZIP CLS (MISCELLANEOUS)
BAG ZIPLOCK 12X15 (MISCELLANEOUS) IMPLANT
BENZOIN TINCTURE PRP APPL 2/3 (GAUZE/BANDAGES/DRESSINGS) ×2 IMPLANT
BLADE SAW SGTL 18X1.27X75 (BLADE) ×2 IMPLANT
BLADE SAW SGTL 18X1.27X75MM (BLADE) ×1
BLADE SURG SZ10 CARB STEEL (BLADE) ×6 IMPLANT
CATH COUDE 5CC RIBBED (CATHETERS) IMPLANT
CATH RIBBED COUDE 5CC (CATHETERS) ×3
CLOSURE STERI-STRIP 1/2X4 (GAUZE/BANDAGES/DRESSINGS) ×1
CLOSURE WOUND 1/2 X4 (GAUZE/BANDAGES/DRESSINGS)
CLSR STERI-STRIP ANTIMIC 1/2X4 (GAUZE/BANDAGES/DRESSINGS) ×1 IMPLANT
COVER PERINEAL POST (MISCELLANEOUS) ×3 IMPLANT
COVER SURGICAL LIGHT HANDLE (MISCELLANEOUS) ×3 IMPLANT
COVER WAND RF STERILE (DRAPES) IMPLANT
CUP ACETAB W/GRIPTION 54 (Plate) IMPLANT
DRAPE STERI IOBAN 125X83 (DRAPES) ×3 IMPLANT
DRAPE U-SHAPE 47X51 STRL (DRAPES) ×6 IMPLANT
DRSG AQUACEL AG ADV 3.5X10 (GAUZE/BANDAGES/DRESSINGS) ×3 IMPLANT
DURAPREP 26ML APPLICATOR (WOUND CARE) ×3 IMPLANT
ELECT REM PT RETURN 15FT ADLT (MISCELLANEOUS) ×3 IMPLANT
FEM STEM 12/14 TAPER SZ 4 HIP (Orthopedic Implant) ×3 IMPLANT
FEMORAL STEM 12/14 TPR SZ4 HIP (Orthopedic Implant) IMPLANT
GAUZE XEROFORM 1X8 LF (GAUZE/BANDAGES/DRESSINGS) ×3 IMPLANT
GLOVE BIO SURGEON STRL SZ7.5 (GLOVE) ×3 IMPLANT
GLOVE BIOGEL PI IND STRL 8 (GLOVE) ×2 IMPLANT
GLOVE BIOGEL PI INDICATOR 8 (GLOVE) ×4
GLOVE ECLIPSE 8.0 STRL XLNG CF (GLOVE) ×3 IMPLANT
GOWN STRL REUS W/TWL XL LVL3 (GOWN DISPOSABLE) ×6 IMPLANT
HANDPIECE INTERPULSE COAX TIP (DISPOSABLE) ×3
HEAD CERAMIC 36 PLUS5 (Hips) ×2 IMPLANT
HOLDER FOLEY CATH W/STRAP (MISCELLANEOUS) ×3 IMPLANT
KIT TURNOVER KIT A (KITS) IMPLANT
LINER PINN ACET NEUT 32X54 ×2 IMPLANT
PACK ANTERIOR HIP CUSTOM (KITS) ×3 IMPLANT
SET HNDPC FAN SPRY TIP SCT (DISPOSABLE) ×1 IMPLANT
STAPLER VISISTAT 35W (STAPLE) IMPLANT
STRIP CLOSURE SKIN 1/2X4 (GAUZE/BANDAGES/DRESSINGS) IMPLANT
SUT ETHIBOND NAB CT1 #1 30IN (SUTURE) ×3 IMPLANT
SUT ETHILON 2 0 PS N (SUTURE) IMPLANT
SUT MNCRL AB 4-0 PS2 18 (SUTURE) IMPLANT
SUT VIC AB 0 CT1 36 (SUTURE) ×3 IMPLANT
SUT VIC AB 1 CT1 36 (SUTURE) ×3 IMPLANT
SUT VIC AB 2-0 CT1 27 (SUTURE) ×6
SUT VIC AB 2-0 CT1 TAPERPNT 27 (SUTURE) ×2 IMPLANT
TRAY FOLEY MTR SLVR 16FR STAT (SET/KITS/TRAYS/PACK) ×3 IMPLANT
YANKAUER SUCT BULB TIP 10FT TU (MISCELLANEOUS) ×3 IMPLANT

## 2019-06-27 NOTE — Anesthesia Procedure Notes (Signed)
Spinal  Patient location during procedure: OR Start time: 06/27/2019 7:24 AM End time: 06/27/2019 7:30 AM Staffing Anesthesiologist: Suzette Battiest, MD Performed: anesthesiologist  Preanesthetic Checklist Completed: patient identified, site marked, surgical consent, pre-op evaluation, timeout performed, IV checked, risks and benefits discussed and monitors and equipment checked Spinal Block Patient position: sitting Prep: DuraPrep Patient monitoring: heart rate, cardiac monitor, continuous pulse ox and blood pressure Approach: midline Location: L3-4 Injection technique: single-shot Needle Needle type: Sprotte  Needle gauge: 24 G Needle length: 9 cm Assessment Sensory level: T4

## 2019-06-27 NOTE — Anesthesia Procedure Notes (Signed)
Date/Time: 06/27/2019 7:22 AM Performed by: Sharlette Dense, CRNA Oxygen Delivery Method: Simple face mask

## 2019-06-27 NOTE — Transfer of Care (Signed)
Immediate Anesthesia Transfer of Care Note  Patient: Parker Daniel  Procedure(s) Performed: LEFT TOTAL HIP ARTHROPLASTY ANTERIOR APPROACH (Left Hip)  Patient Location: PACU  Anesthesia Type:Spinal  Level of Consciousness: drowsy  Airway & Oxygen Therapy: Patient Spontanous Breathing and Patient connected to face mask oxygen  Post-op Assessment: Report given to RN and Post -op Vital signs reviewed and stable  Post vital signs: Reviewed and stable  Last Vitals:  Vitals Value Taken Time  BP    Temp    Pulse    Resp    SpO2      Last Pain:  Vitals:   06/27/19 0606  TempSrc:   PainSc: 8       Patients Stated Pain Goal: 4 (23/30/07 6226)  Complications: No apparent anesthesia complications

## 2019-06-27 NOTE — Op Note (Signed)
NAME: Deborra MedinaUNN, Oluwaferanmi D. MEDICAL RECORD [id number] ACCOUNT [id number] DATE OF BIRTH:1956/09/16 FACILITY: [facility] LOCATION: [facility] PHYSICIAN:Braelyn Bordonaro Aretha ParrotY. Dervin Vore, MD  OPERATIVE REPORT  DATE OF PROCEDURE:  06/27/2019  PREOPERATIVE DIAGNOSIS:  Primary osteoarthritis and degenerative joint disease, left hip.  POSTOPERATIVE DIAGNOSIS:  Primary osteoarthritis and degenerative joint disease, left hip.  PROCEDURE:  Left total hip arthroplasty through direct anterior approach.  IMPLANTS:  DePuy Sector Gription acetabular component size 54, size 36+0 neutral polyethylene liner, size 4 high-offset ACTIS femoral component, size 36+5 ceramic hip ball.  SURGEON:  Vanita PandaChristopher Y. Magnus IvanBlackman, MD  ASSISTANT:  Richardean CanalGilbert Clark, PA-C  ANESTHESIA:  Spinal.  ANTIBIOTICS:  Two grams IV Ancef.  ESTIMATED BLOOD LOSS:  250 mL.  COMPLICATIONS:  None.  INDICATIONS:  The patient is a 63 year old gentleman well known to me.  He has debilitating arthritis involving his left hip, but also his right knee.  His left hip is much worse though.  His activities of daily living have been severely detrimentally  affected by his left hip pain.  This has affected his quality of life, his mobility, and his activities of daily living to the point he does wish to proceed with total hip arthroplasty.  His x-rays show complete loss of the superolateral joint space of  the hip with bone-on-bone wear.  His pain is daily.  At this point, he does wish to proceed with surgery, fully understanding the risk of acute blood loss anemia, nerve and vessel injury, fracture, infection, dislocation, DVT, and implant failure.  He  understands our goals are to decrease pain, improve mobility, and overall improve quality of life.  DESCRIPTION OF PROCEDURE:  After informed consent was obtained and appropriate left hip was marked, he was brought to the operating room and sat up on a stretcher where spinal anesthesia was obtained.  He was then laid  supine on the stretcher.  I was  able to assess his leg length.  He does have a flexion contracture of his right knee.  We then placed traction boots on both his feet and placed him supine on the Hana fracture table, the perineal post in place, and both legs in in-line skeletal traction  device and no traction applied.  His left operative hip was prepped and draped with DuraPrep and sterile drapes.  A time-out was called.  He was identified as correct patient, correct left hip.  We then made an incision just inferior and posterior to  the anterior superior iliac spine and took this obliquely down the leg.  Dissected down tensor fascia lata muscle.  Tensor fascia was then divided longitudinally to proceed with direct anterior approach to the hip.  We identified and cauterized  circumflex vessels and identified the hip capsule, opened up the hip capsule in an L-type format, finding moderate joint effusion and significant periarticular osteophytes around the femoral head and neck.  I then placed Cobra retractors around the  medial and lateral femoral neck and made our femoral neck cut with an oscillating saw just proximal to the lesser trochanter.  We completed this with an osteotome and placed a corkscrew guide in the femoral head and removed the femoral head in its  entirety and found a wide area devoid of cartilage.  I then placed a bent Hohmann over the medial acetabular rim and removed remnants of the acetabular labrum and other debris from the hip.  We then began reaming under direct visualization from a size 44  reamer in stepwise increments up to a size  53 with all reamers under direct visualization, the last reamer under direct fluoroscopy so we could obtain our depth of reaming, our inclination and anteversion.  We then placed the real DePuy Sector Gription  acetabular component size 54 and a 36+0 neutral polyethylene liner for that size acetabular component.  Attention was then turned to the femur.   With the leg externally rotated to 120 degrees, extended and adducted, we were able to place a Mueller  retractor medially and a Hohmann retractor behind the greater trochanter.  We released the lateral joint capsule and used a box-cutting osteotome to enter the femoral canal and a rongeur to lateralize it.  We then began broaching using the ACTIS  broaching system from DePuy from a size 0 up to a size 4.  With the size 4 in place, we tried a high-offset femoral neck and a 36+1.5 hip ball.  We reduced this in the acetabulum, and we were pleased with the leg length, but we felt like he really needed  a little bit more offset.  We dislocated the hip and removed the trial components.  We went with the real high offset ACTIS femoral component size 4 and placed this easily.  We then went with a 36+5 ceramic hip ball.  I reduced this into the acetabulum,  and I was pleased more so with his stability and range of motion as well as offset.  This was assessed clinically and radiographically.  We then irrigated the soft tissue with normal saline solution using pulsatile lavage.  I was able to reapproximate  the joint capsule using #1 Ethibond suture, #1 Vicryl was used to close the tensor fascia, 0 Vicryl was used to close deep tissue, 2-0 Vicryl was used to close the subcutaneous tissue, and 4-0 Monocryl subcuticular stitch was placed.  We then placed  Steri-Strips and an Aquacel dressing.  He was taken off the Hana table and taken to the recovery room in stable condition.  All final counts were correct.  There were no complications noted.  Note Benita Stabile, PA-C, assisted during the entire case.  His  assistance was crucial for facilitating all aspects of this case.  LN/NUANCE  D:06/27/2019 T:06/27/2019 JOB:[id number]

## 2019-06-27 NOTE — H&P (Signed)
TOTAL HIP ADMISSION H&P  Patient is admitted for left total hip arthroplasty.  Subjective:  Chief Complaint: left hip pain  HPI: Parker MedinaDavid D Gedney, 63 y.o. male, has a history of pain and functional disability in the left hip(s) due to arthritis and patient has failed non-surgical conservative treatments for greater than 12 weeks to include NSAID's and/or analgesics, corticosteriod injections, flexibility and strengthening excercises, use of assistive devices, weight reduction as appropriate and activity modification.  Onset of symptoms was gradual starting 3 years ago with gradually worsening course since that time.The patient noted no past surgery on the left hip(s).  Patient currently rates pain in the left hip at 10 out of 10 with activity. Patient has night pain, worsening of pain with activity and weight bearing, trendelenberg gait, pain that interfers with activities of daily living, pain with passive range of motion and crepitus. Patient has evidence of subchondral cysts, subchondral sclerosis, periarticular osteophytes and joint space narrowing by imaging studies. This condition presents safety issues increasing the risk of falls.  There is no current active infection.  Patient Active Problem List   Diagnosis Date Noted  . Unilateral primary osteoarthritis, left hip 04/22/2019   Past Medical History:  Diagnosis Date  . Arthritis   . Diabetes mellitus without complication (HCC)   . Gout   . Hyperlipidemia   . Hypertension     Past Surgical History:  Procedure Laterality Date  . APPENDECTOMY    . CHOLECYSTECTOMY    . KNEE SURGERY    . TONSILLECTOMY      Current Facility-Administered Medications  Medication Dose Route Frequency Provider Last Rate Last Dose  . ceFAZolin (ANCEF) IVPB 2g/100 mL premix  2 g Intravenous On Call to OR Kirtland Bouchardlark, Gilbert W, PA-C      . chlorhexidine (HIBICLENS) 4 % liquid 4 application  60 mL Topical Once Richardean Canallark, Gilbert W, PA-C      . povidone-iodine 10 % swab  2 application  2 application Topical Once Richardean Canallark, Gilbert W, PA-C      . tranexamic acid (CYKLOKAPRON) IVPB 1,000 mg  1,000 mg Intravenous To OR Kirtland Bouchardlark, Gilbert W, PA-C       No Known Allergies  Social History   Tobacco Use  . Smoking status: Never Smoker  . Smokeless tobacco: Never Used  Substance Use Topics  . Alcohol use: Not Currently    Family History  Problem Relation Age of Onset  . Cancer Father        bladder     Review of Systems  Musculoskeletal: Positive for joint pain.  All other systems reviewed and are negative.   Objective:  Physical Exam  Constitutional: He is oriented to person, place, and time. He appears well-developed and well-nourished.  HENT:  Head: Normocephalic and atraumatic.  Eyes: Pupils are equal, round, and reactive to light. EOM are normal.  Neck: Normal range of motion. Neck supple.  Cardiovascular: Normal rate.  Respiratory: Effort normal.  Musculoskeletal:     Left hip: He exhibits decreased range of motion, decreased strength, tenderness and bony tenderness.  Neurological: He is alert and oriented to person, place, and time.  Skin: Skin is warm and dry.  Psychiatric: He has a normal mood and affect.    Vital signs in last 24 hours: Temp:  [98.6 F (37 C)] 98.6 F (37 C) (08/14 0553) Pulse Rate:  [70] 70 (08/14 0553) Resp:  [16] 16 (08/14 0553) BP: (133)/(85) 133/85 (08/14 0553) SpO2:  [96 %] 96 % (08/14  7846) Weight:  [99.6 kg] 99.6 kg (08/14 0606)  Labs:   Estimated body mass index is 33.39 kg/m as calculated from the following:   Height as of this encounter: 5\' 8"  (1.727 m).   Weight as of this encounter: 99.6 kg.   Imaging Review Plain radiographs demonstrate severe degenerative joint disease of the left hip(s). The bone quality appears to be excellent for age and reported activity level.      Assessment/Plan:  End stage arthritis, left hip(s)  The patient history, physical examination, clinical judgement of the  provider and imaging studies are consistent with end stage degenerative joint disease of the left hip(s) and total hip arthroplasty is deemed medically necessary. The treatment options including medical management, injection therapy, arthroscopy and arthroplasty were discussed at length. The risks and benefits of total hip arthroplasty were presented and reviewed. The risks due to aseptic loosening, infection, stiffness, dislocation/subluxation,  thromboembolic complications and other imponderables were discussed.  The patient acknowledged the explanation, agreed to proceed with the plan and consent was signed. Patient is being admitted for inpatient treatment for surgery, pain control, PT, OT, prophylactic antibiotics, VTE prophylaxis, progressive ambulation and ADL's and discharge planning.The patient is planning to be discharged home with home health services    Patient's anticipated LOS is less than 2 midnights, meeting these requirements: - Younger than 2 - Lives within 1 hour of care - Has a competent adult at home to recover with post-op recover - NO history of  - Chronic pain requiring opiods  - Diabetes  - Coronary Artery Disease  - Heart failure  - Heart attack  - Stroke  - DVT/VTE  - Cardiac arrhythmia  - Respiratory Failure/COPD  - Renal failure  - Anemia  - Advanced Liver disease

## 2019-06-27 NOTE — Anesthesia Postprocedure Evaluation (Signed)
Anesthesia Post Note  Patient: Parker Daniel  Procedure(s) Performed: LEFT TOTAL HIP ARTHROPLASTY ANTERIOR APPROACH (Left Hip)     Patient location during evaluation: PACU Anesthesia Type: Spinal Level of consciousness: awake and alert Pain management: pain level controlled Vital Signs Assessment: post-procedure vital signs reviewed and stable Respiratory status: spontaneous breathing and respiratory function stable Cardiovascular status: blood pressure returned to baseline and stable Postop Assessment: spinal receding Anesthetic complications: no    Last Vitals:  Vitals:   06/27/19 1111 06/27/19 1230  BP: (!) 143/92 138/89  Pulse: 68 75  Resp: 15 16  Temp: (!) 36.4 C 36.5 C  SpO2: 99% 97%    Last Pain:  Vitals:   06/27/19 1230  TempSrc: Oral  PainSc:                  Tiajuana Amass

## 2019-06-27 NOTE — Evaluation (Signed)
Physical Therapy Evaluation Patient Details Name: Parker Daniel MRN: [id number] DOB: 02-24-1956 Today's Date: 06/27/2019   History of Present Illness  s/p L DA THA. PMH: HTN, gout, DM  Clinical Impression  Pt is s/p THA resulting in the deficits listed below (see PT Problem List).  Pt reluctantly agreeable to OOB. amb ~ 25' with min assist. Anticipate steady progress.  Pt will benefit from skilled PT to increase their independence and safety with mobility to allow discharge to the venue listed below.     Follow Up Recommendations Follow surgeon's recommendation for DC plan and follow-up therapies    Equipment Recommendations  Rolling walker with 5" wheels    Recommendations for Other Services       Precautions / Restrictions Precautions Precautions: Fall Restrictions Weight Bearing Restrictions: No Other Position/Activity Restrictions: WBAT      Mobility  Bed Mobility Overal bed mobility: Needs Assistance Bed Mobility: Supine to Sit     Supine to sit: Min assist     General bed mobility comments: assist with LE, incr time  Transfers Overall transfer level: Needs assistance Equipment used: Rolling walker (2 wheeled) Transfers: Sit to/from Stand Sit to Stand: Min assist;From elevated surface         General transfer comment: assist to rise, cues for hand placement  Ambulation/Gait Ambulation/Gait assistance: Min assist Gait Distance (Feet): 25 Feet Assistive device: Rolling walker (2 wheeled) Gait Pattern/deviations: Step-to pattern;Decreased stance time - left     General Gait Details: cues for sequence, posture, RW position  Stairs            Wheelchair Mobility    Modified Rankin (Stroke Patients Only)       Balance                                             Pertinent Vitals/Pain Pain Assessment: 0-10 Pain Score: 6  Pain Location: left hip Pain Descriptors / Indicators: Grimacing;Sore Pain Intervention(s): Limited  activity within patient's tolerance;Monitored during session;Repositioned    Home Living Family/patient expects to be discharged to:: Private residence Living Arrangements: Spouse/significant other Available Help at Discharge: Family;Available PRN/intermittently Type of Home: House Home Access: Stairs to enter   Entrance Stairs-Number of Steps: 1 Home Layout: One level Home Equipment: Walker - 2 wheels;Grab bars - tub/shower;Other (comment) Additional Comments: walking stick    Prior Function Level of Independence: Independent               Hand Dominance        Extremity/Trunk Assessment   Upper Extremity Assessment Upper Extremity Assessment: Overall WFL for tasks assessed    Lower Extremity Assessment Lower Extremity Assessment: LLE deficits/detail LLE Deficits / Details: ankle WFL; knee and hip 2+/5,limited by post op pain and weakness       Communication   Communication: No difficulties  Cognition Arousal/Alertness: Awake/alert Behavior During Therapy: WFL for tasks assessed/performed Overall Cognitive Status: Within Functional Limits for tasks assessed                                        General Comments      Exercises Total Joint Exercises Ankle Circles/Pumps: Both;10 reps;AROM   Assessment/Plan    PT Assessment Patient needs continued PT services  PT Problem List Decreased  strength;Decreased range of motion;Decreased activity tolerance;Pain;Decreased mobility;Decreased knowledge of use of DME       PT Treatment Interventions DME instruction;Therapeutic exercise;Gait training;Stair training;Functional mobility training;Therapeutic activities;Patient/family education    PT Goals (Current goals can be found in the Care Plan section)  Acute Rehab PT Goals Patient Stated Goal: home PT Goal Formulation: With patient Time For Goal Achievement: 07/04/19 Potential to Achieve Goals: Good    Frequency 7X/week   Barriers to  discharge        Co-evaluation               AM-PAC PT "6 Clicks" Mobility  Outcome Measure Help needed turning from your back to your side while in a flat bed without using bedrails?: A Little Help needed moving from lying on your back to sitting on the side of a flat bed without using bedrails?: A Little Help needed moving to and from a bed to a chair (including a wheelchair)?: A Little Help needed standing up from a chair using your arms (e.g., wheelchair or bedside chair)?: A Little Help needed to walk in hospital room?: A Little Help needed climbing 3-5 steps with a railing? : A Little 6 Click Score: 18    End of Session Equipment Utilized During Treatment: Gait belt Activity Tolerance: Patient tolerated treatment well Patient left: in chair;with chair alarm set;with family/visitor present   PT Visit Diagnosis: Difficulty in walking, not elsewhere classified (R26.2)    Time: 8338-2505 PT Time Calculation (min) (ACUTE ONLY): 23 min   Charges:   PT Evaluation $PT Eval Low Complexity: 1 Low PT Treatments $Gait Training: 8-22 mins        Kenyon Ana, PT  Pager: [phone] Acute Rehab Dept [facility]): [phone]   06/27/2019   [facility] 06/27/2019, 3:16 PM

## 2019-06-27 NOTE — Brief Op Note (Signed)
06/27/2019  8:41 AM  PATIENT:  Evalyn Casco  63 y.o. male  PRE-OPERATIVE DIAGNOSIS:  osteoarthritis left hip  POST-OPERATIVE DIAGNOSIS:  osteoarthritis left hip  PROCEDURE:  Procedure(s): LEFT TOTAL HIP ARTHROPLASTY ANTERIOR APPROACH (Left)  SURGEON:  Surgeon(s) and Role:    Mcarthur Rossetti, MD - Primary  PHYSICIAN ASSISTANT: Benita Stabile, PA-C  ANESTHESIA:   spinal  EBL:  200 mL   COUNTS:  YES  DICTATION: .Other Dictation: Dictation Number [id number]  PLAN OF CARE: Admit to inpatient   PATIENT DISPOSITION:  PACU - hemodynamically stable.   Delay start of Pharmacological VTE agent (>24hrs) due to surgical blood loss or risk of bleeding: no

## 2019-06-28 DIAGNOSIS — M1612 Unilateral primary osteoarthritis, left hip: Secondary | ICD-10-CM | POA: Diagnosis not present

## 2019-06-28 DIAGNOSIS — Z9889 Other specified postprocedural states: Secondary | ICD-10-CM

## 2019-06-28 DIAGNOSIS — Z96642 Presence of left artificial hip joint: Secondary | ICD-10-CM

## 2019-06-28 LAB — CBC
HCT: 33.9 % — ABNORMAL LOW (ref 39.0–52.0)
Hemoglobin: 10.6 g/dL — ABNORMAL LOW (ref 13.0–17.0)
MCH: 30.4 pg (ref 26.0–34.0)
MCHC: 31.3 g/dL (ref 30.0–36.0)
MCV: 97.1 fL (ref 80.0–100.0)
Platelets: 214 10*3/uL (ref 150–400)
RBC: 3.49 MIL/uL — ABNORMAL LOW (ref 4.22–5.81)
RDW: 14.8 % (ref 11.5–15.5)
WBC: 13.6 10*3/uL — ABNORMAL HIGH (ref 4.0–10.5)
nRBC: 0 % (ref 0.0–0.2)

## 2019-06-28 LAB — BASIC METABOLIC PANEL
Anion gap: 7 (ref 5–15)
BUN: 16 mg/dL (ref 8–23)
CO2: 23 mmol/L (ref 22–32)
Calcium: 8.7 mg/dL — ABNORMAL LOW (ref 8.9–10.3)
Chloride: 107 mmol/L (ref 98–111)
Creatinine, Ser: 0.85 mg/dL (ref 0.61–1.24)
GFR calc Af Amer: >60 mL/min (ref >60)
GFR calc non Af Amer: >60 mL/min (ref >60)
Glucose, Bld: 138 mg/dL — ABNORMAL HIGH (ref 70–99)
Potassium: 4.6 mmol/L (ref 3.5–5.1)
Sodium: 137 mmol/L (ref 135–145)

## 2019-06-28 MED ORDER — METHOCARBAMOL 500 MG PO TABS: 500.0000 mg | ORAL_TABLET | Freq: Four times a day (QID) | ORAL | 0 refills | Status: DC | PRN

## 2019-06-28 MED ORDER — ASPIRIN 81 MG PO CHEW: 81.0000 mg | CHEWABLE_TABLET | Freq: Two times a day (BID) | ORAL | 0 refills | Status: DC

## 2019-06-28 MED ORDER — TAMSULOSIN HCL 0.4 MG PO CAPS
0.4000 mg | ORAL_CAPSULE | Freq: Every day | ORAL | Status: DC
  Administered 2019-06-28 – 2019-06-29 (×2): 0.4 mg via ORAL
  Filled 2019-06-28 (×2): qty 1

## 2019-06-28 MED ORDER — HYDROMORPHONE HCL 2 MG PO TABS: 2.0000 mg | ORAL_TABLET | ORAL | 0 refills | Status: AC | PRN

## 2019-06-28 NOTE — Progress Notes (Signed)
Physical Therapy Treatment Patient Details Name: Parker MedinaDavid D Thayne MRN: [id number] DOB: [birth year] Today's Date: 06/28/2019    History of Present Illness s/p L DA THA. PMH: HTN, gout, DM    PT Comments    Pt progressing well; reviewed HEP and stairs. Pt is ready to d/t from PT standpoint, wife in agreement, but now pt states he does not want to go home today.    Follow Up Recommendations  Follow surgeon's recommendation for DC plan and follow-up therapies     Equipment Recommendations  Rolling walker with 5" wheels    Recommendations for Other Services       Precautions / Restrictions Precautions Precautions: Fall Restrictions Weight Bearing Restrictions: No Other Position/Activity Restrictions: WBAT    Mobility  Bed Mobility Overal bed mobility: Needs Assistance Bed Mobility: Supine to Sit;Sit to Supine     Supine to sit: Min guard Sit to supine: Min guard   General bed mobility comments: cues to self assist   Transfers Overall transfer level: Needs assistance Equipment used: Rolling walker (2 wheeled) Transfers: Sit to/from Stand Sit to Stand: Supervision         General transfer comment: cues for hand placement   Ambulation/Gait Ambulation/Gait assistance: Supervision Gait Distance (Feet): 160 Feet Assistive device: Rolling walker (2 wheeled) Gait Pattern/deviations: Step-to pattern;Decreased stance time - left     General Gait Details: cues for sequence, posture, RW position   Stairs Stairs: Yes Stairs assistance: Min assist Stair Management: Step to pattern;One rail Left;One rail Right;Sideways;Forwards;With cane Number of Stairs: 4 General stair comments: cues for up 4 stairs with cane and rail, down 4 with one rail on R; handout given, reviewed handout with rail   Wheelchair Mobility    Modified Rankin (Stroke Patients Only)       Balance                                            Cognition Arousal/Alertness:  Awake/alert Behavior During Therapy: WFL for tasks assessed/performed Overall Cognitive Status: Within Functional Limits for tasks assessed                                        Exercises Total Joint Exercises Ankle Circles/Pumps: Both;10 reps;AROM Quad Sets: AROM;Both;10 reps Short Arc Quad: AROM;Left;10 reps Heel Slides: AROM;AAROM;Left;10 reps Hip ABduction/ADduction: AAROM;Left;10 reps    General Comments        Pertinent Vitals/Pain Pain Assessment: 0-10 Pain Score: 5  Pain Location: left hip Pain Descriptors / Indicators: Grimacing;Sore Pain Intervention(s): Limited activity within patient's tolerance;Monitored during session;Repositioned;Ice applied    Home Living                      Prior Function            PT Goals (current goals can now be found in the care plan section) Acute Rehab PT Goals Patient Stated Goal: home PT Goal Formulation: With patient Time For Goal Achievement: 07/04/19 Potential to Achieve Goals: Good Progress towards PT goals: Progressing toward goals    Frequency    7X/week      PT Plan Current plan remains appropriate    Co-evaluation              AM-PAC PT "6 Clicks" Mobility  Outcome Measure  Help needed turning from your back to your side while in a flat bed without using bedrails?: A Little Help needed moving from lying on your back to sitting on the side of a flat bed without using bedrails?: None Help needed moving to and from a bed to a chair (including a wheelchair)?: A Little Help needed standing up from a chair using your arms (e.g., wheelchair or bedside chair)?: A Little Help needed to walk in hospital room?: A Little Help needed climbing 3-5 steps with a railing? : A Little 6 Click Score: 19    End of Session Equipment Utilized During Treatment: Gait belt Activity Tolerance: Patient tolerated treatment well Patient left: in bed;with call bell/phone within reach;with  family/visitor present;with bed alarm set   PT Visit Diagnosis: Difficulty in walking, not elsewhere classified (R26.2)     Time: 1429-1500 PT Time Calculation (min) (ACUTE ONLY): 31 min  Charges:  $Gait Training: 23-37 mins                     Kenyon Ana, PT  Pager: [phone] Acute Rehab Dept [facility]): [phone]   06/28/2019    [facility] 06/28/2019, 3:15 PM

## 2019-06-28 NOTE — Discharge Instructions (Signed)

## 2019-06-28 NOTE — Care Management Obs Status (Signed)
[place] NOTIFICATION   Patient Details  Name: RISHARD DELANGE MRN: [id number] Date of Birth: 1955/12/15   Medicare Observation Status Notification Given:  Yes    Joaquin Courts, RN 06/28/2019, 5:03 PM

## 2019-06-28 NOTE — Care Management CC44 (Signed)
Condition Code 44 Documentation Completed  Patient Details  Name: TRAMAYNE SEBESTA MRN: [id number] Date of Birth: 04-20-1956   Condition Code 44 given:  Yes Patient signature on Condition Code 44 notice:  Yes Documentation of 2 MD's agreement:  Yes Code 44 added to claim:  Yes    Joaquin Courts, RN 06/28/2019, 5:03 PM

## 2019-06-28 NOTE — Progress Notes (Signed)
    Home health agencies that serve [phone].        Home Health Agencies Search Results  Results List Table  Home Health Agency Information Quality of Patient Care Rating Patient Survey Summary Rating  [facility] [phone] 3 out of 5 stars 4 out of 5 stars  [facility] [phone] 4  out of 5 stars 3 out of 5 stars  [facility] [phone] 4 out of 5 stars 4 out of 5 stars  [facility] [phone] 4 out of 5 stars 4 out of 5 stars  [facility] [phone] 4 out of 5 stars 4 out of 5 stars  [facility] [phone] 4 out of 5 stars 4 out of 5 stars  [facility] [phone] 4 out of 5 stars 4 out of 5 stars  [facility] [place] [phone] 3  out of 5 stars 4 out of 5 stars  [facility] [phone] 3 out of 5 stars 4 out of 5 stars  [facility] [phone] 4 out of 5 stars Not Available12  [facility] [phone] 3 out of 5 stars 4 out of 5 stars  [facility] [phone] 3  out of 5 stars 3 out of 5 stars  [facility] [phone] 4  out of 5 stars 3 out of 5 stars   Home Health Footnotes  Footnote number Footnote as displayed on Home Health Compare  1 This agency provides services under a federal waiver program to non-traditional, chronic long term population.  2 This agency provides services to a special needs population.  3 Not Available.  4 The number of patient episodes for this measure is too small to report.  5 This measure currently does not have data or provider has been certified/recertified for less than 6 months.  6 The national average for this measure is not provided because of state-to-state differences in data collection.  7 Medicare is not displaying rates for this measure for any home health agency, because of an issue with the data.  8  There were problems with the data and they are being corrected.  9 Zero, or very few, patients met the survey's rules for inclusion. The scores shown, if any, reflect a very small number of surveys and may not accurately tell how an agency is doing.  10 Survey results are based on less than 12 months of data.  11 Fewer than 70 patients completed the survey. Use the scores shown, if any, with caution as the number of surveys may be too low to accurately tell how an agency is doing.  12 No survey results are available for this period.  13 Data suppressed by CMS for one or more quarters.    

## 2019-06-28 NOTE — Progress Notes (Signed)
Physical Therapy Treatment Patient Details Name: Parker MedinaDavid D Daniel MRN: [id number] DOB: 04-13-1956 Today's Date: 06/28/2019    History of Present Illness s/p L DA THA. PMH: HTN, gout, DM    PT Comments    Pt progressing with gait/distance and tolerance. Pt with multiple complaints about various aspects of care; he states he heard and  Felt a pop in his hip while being assisted back to bed yesterday. Pt states he made Dr. Magnus IvanBlackman aware.  Pt did not have incr pain with WBing, only complaints of soreness.  Will continue to follow.  Follow Up Recommendations  Follow surgeon's recommendation for DC plan and follow-up therapies     Equipment Recommendations  Rolling walker with 5" wheels    Recommendations for Other Services       Precautions / Restrictions Precautions Precautions: Fall Restrictions Weight Bearing Restrictions: No Other Position/Activity Restrictions: WBAT    Mobility  Bed Mobility Overal bed mobility: Needs Assistance Bed Mobility: Supine to Sit;Sit to Supine     Supine to sit: Min assist Sit to supine: Min assist   General bed mobility comments: assist with LE, incr time  Transfers Overall transfer level: Needs assistance Equipment used: Rolling walker (2 wheeled) Transfers: Sit to/from Stand Sit to Stand: Min guard;Min assist         General transfer comment: assist to rise, cues for hand placement  Ambulation/Gait Ambulation/Gait assistance: Min guard;Supervision Gait Distance (Feet): 160 Feet Assistive device: Rolling walker (2 wheeled) Gait Pattern/deviations: Step-to pattern;Decreased stance time - left     General Gait Details: cues for sequence, posture, RW position   Stairs             Wheelchair Mobility    Modified Rankin (Stroke Patients Only)       Balance                                            Cognition Arousal/Alertness: Awake/alert Behavior During Therapy: WFL for tasks  assessed/performed Overall Cognitive Status: Within Functional Limits for tasks assessed                                        Exercises      General Comments        Pertinent Vitals/Pain Pain Assessment: 0-10 Pain Score: 4  Pain Location: left hip Pain Descriptors / Indicators: Grimacing;Sore Pain Intervention(s): Limited activity within patient's tolerance;Monitored during session;Premedicated before session;Repositioned    Home Living                      Prior Function            PT Goals (current goals can now be found in the care plan section) Acute Rehab PT Goals Patient Stated Goal: home PT Goal Formulation: With patient Time For Goal Achievement: 07/04/19 Potential to Achieve Goals: Good Progress towards PT goals: Progressing toward goals    Frequency    7X/week      PT Plan Current plan remains appropriate    Co-evaluation              AM-PAC PT "6 Clicks" Mobility   Outcome Measure  Help needed turning from your back to your side while in a flat bed without using bedrails?: A Little  Help needed moving from lying on your back to sitting on the side of a flat bed without using bedrails?: A Little Help needed moving to and from a bed to a chair (including a wheelchair)?: A Little Help needed standing up from a chair using your arms (e.g., wheelchair or bedside chair)?: A Little Help needed to walk in hospital room?: A Little Help needed climbing 3-5 steps with a railing? : A Little 6 Click Score: 18    End of Session Equipment Utilized During Treatment: Gait belt Activity Tolerance: Patient tolerated treatment well Patient left: in bed;with call bell/phone within reach;with bed alarm set   PT Visit Diagnosis: Difficulty in walking, not elsewhere classified (R26.2)     Time: 3893-7342 PT Time Calculation (min) (ACUTE ONLY): 27 min  Charges:  $Gait Training: 23-37 mins                     Kenyon Ana,  PT  Pager: [phone] Acute Rehab Dept [facility]): [phone]   06/28/2019    [facility] 06/28/2019, 12:31 PM

## 2019-06-28 NOTE — Progress Notes (Signed)
Subjective: 1 Day Post-Op Procedure(s) (LRB): LEFT TOTAL HIP ARTHROPLASTY ANTERIOR APPROACH (Left) Patient reports pain as moderate.  Some urinary retention this am.  Objective: Vital signs in last 24 hours: Temp:  [97.7 F (36.5 C)-98.8 F (37.1 C)] 98.1 F (36.7 C) (08/15 0840) Pulse Rate:  [75-86] 81 (08/15 0840) Resp:  [15-18] 15 (08/15 0840) BP: (120-143)/(78-90) 142/89 (08/15 0840) SpO2:  [94 %-100 %] 100 % (08/15 0840)  Intake/Output from previous day: 08/14 0701 - 08/15 0700 In: 2737.4 [P.O.:240; I.V.:2297.4; IV Piggyback:200] Out: 3860 [Urine:3660; Blood:200] Intake/Output this shift: Total I/O In: 240 [P.O.:240] Out: 0   Recent Labs    06/28/19 0226  HGB 10.6*   Recent Labs    06/28/19 0226  WBC 13.6*  RBC 3.49*  HCT 33.9*  PLT 214   Recent Labs    06/28/19 0226  NA 137  K 4.6  CL 107  CO2 23  BUN 16  CREATININE 0.85  GLUCOSE 138*  CALCIUM 8.7*   No results for input(s): LABPT, INR in the last 72 hours.  Sensation intact distally Intact pulses distally Dorsiflexion/Plantar flexion intact Incision: dressing C/D/I   Assessment/Plan: 1 Day Post-Op Procedure(s) (LRB): LEFT TOTAL HIP ARTHROPLASTY ANTERIOR APPROACH (Left) Up with therapy Discharge home with home health this afternoon vs tomorrow depending on his progress with therapy, pain control and urinary retention. Will try Flomax.      Mcarthur Rossetti 06/28/2019, 11:21 AM

## 2019-06-28 NOTE — TOC Initial Note (Signed)
Transition of Care [facility]) - Initial/Assessment Note    Patient Details  Name: Parker Daniel MRN: [id number] Date of Birth: 01-31-1956  Transition of [facility]) CM/SW Contact:    Joaquin Courts, RN Phone Number: 06/28/2019, 12:19 PM  Clinical Narrative:       CM spoke with patient at bedside. [facility] with [facility] for [facility]. Patient reports he has rolling walker and 3-in-1 at home.             Expected Discharge Plan: [place] Barriers to Discharge: No Barriers Identified   Patient Goals and CMS Choice Patient states their goals for this hospitalization and ongoing recovery are:: to go home CMS Medicare.gov Compare Post Acute Care list provided to:: Patient Choice offered to / list presented to : Patient  Expected Discharge Plan and Services Expected Discharge Plan: [place]   Discharge Planning Services: CM Consult Post Acute Care Choice: [place] arrangements for the past 2 months: Single Family Home Expected Discharge Date: 06/28/19               DME Arranged: N/A DME Agency: NA       HH Arranged: PT HH Agency: [facility] (formerly [employer]) Date [place]: 06/28/19 Time [place]: 1218 Representative spoke with at [place]: Alwyn Ren  Prior Living Arrangements/Services Living arrangements for the past 2 months: [place] Lives with:: Spouse Patient language and need for interpreter reviewed:: Yes Do you feel safe going back to the place where you live?: Yes      Need for Family Participation in Patient Care: Yes (Comment) Care giver support system in place?: Yes (comment)   Criminal Activity/Legal Involvement Pertinent to Current Situation/Hospitalization: No - Comment as needed  Activities of Daily Living Home Assistive Devices/Equipment: Cane (specify quad or straight), Grab bars in shower, Eyeglasses, Hand-held shower hose, Walker (specify type) ADL  Screening (condition at time of admission) Patient's cognitive ability adequate to safely complete daily activities?: Yes Is the patient deaf or have difficulty hearing?: No Does the patient have difficulty seeing, even when wearing glasses/contacts?: No Does the patient have difficulty concentrating, remembering, or making decisions?: No Patient able to express need for assistance with ADLs?: Yes Does the patient have difficulty dressing or bathing?: No Independently performs ADLs?: Yes (appropriate for developmental age) Does the patient have difficulty walking or climbing stairs?: Yes Weakness of Legs: None Weakness of Arms/Hands: None  Permission Sought/Granted                  Emotional Assessment Appearance:: Appears stated age Attitude/Demeanor/Rapport: Engaged Affect (typically observed): Accepting Orientation: : Oriented to Place, Oriented to  Time, Oriented to Situation, Oriented to Self   Psych Involvement: No (comment)  Admission diagnosis:  osteoarthritis left hip Patient Active Problem List   Diagnosis Date Noted  . Status post total replacement of left hip 06/27/2019  . Unilateral primary osteoarthritis, left hip 04/22/2019   PCP:  System, Pcp Not In Pharmacy:   [place], [address] [place] [address] Phone: [phone] Fax: [phone]     Social Determinants of Health (SDOH) Interventions    Readmission Risk Interventions No flowsheet data found.

## 2019-06-29 DIAGNOSIS — M1612 Unilateral primary osteoarthritis, left hip: Secondary | ICD-10-CM | POA: Diagnosis not present

## 2019-06-29 NOTE — Discharge Summary (Signed)
Discharge Diagnoses:  Principal Problem:   Unilateral primary osteoarthritis, left hip Active Problems:   Status post total replacement of left hip   History of arthroplasty of left hip   Surgeries: Procedure(s): LEFT TOTAL HIP ARTHROPLASTY ANTERIOR APPROACH on 06/27/2019    Consultants:   Discharged Condition: Improved  Hospital Course: Parker Daniel is an 63 y.o. male who was admitted 06/27/2019 with a chief complaint of osteoarthritis left hip, with a final diagnosis of osteoarthritis left hip.  Patient was brought to the operating room on 06/27/2019 and underwent Procedure(s): LEFT TOTAL HIP ARTHROPLASTY ANTERIOR APPROACH.    Patient was given perioperative antibiotics:  Anti-infectives (From admission, onward)   Start     Dose/Rate Route Frequency Ordered Stop   06/27/19 1330  ceFAZolin (ANCEF) IVPB 1 g/50 mL premix     1 g 100 mL/hr over 30 Minutes Intravenous Every 6 hours 06/27/19 1111 06/27/19 1903   06/27/19 0600  ceFAZolin (ANCEF) IVPB 2g/100 mL premix     2 g 200 mL/hr over 30 Minutes Intravenous On call to O.R. 06/27/19 0543 06/27/19 0630    .  Patient was given sequential compression devices, early ambulation, and aspirin for DVT prophylaxis.  Recent vital signs:  Patient Vitals for the past 24 hrs:  BP Temp Temp src Pulse Resp SpO2  06/29/19 0501 137/83 98.4 F (36.9 C) Oral 78 16 95 %  06/28/19 2215 120/81 98.7 F (37.1 C) Oral 77 14 95 %  06/28/19 1352 (!) 145/83 (!) 97.5 F (36.4 C) - 85 15 99 %  .  Recent laboratory studies: No results found.  Discharge Medications:   Allergies as of 06/29/2019   No Known Allergies     Medication List    TAKE these medications   allopurinol 300 MG tablet Commonly known as: ZYLOPRIM Take 300 mg by mouth daily.   aspirin 81 MG chewable tablet Chew 1 tablet (81 mg total) by mouth 2 (two) times daily.   ferrous sulfate 325 (65 FE) MG tablet Take 325 mg by mouth daily with breakfast.   gabapentin 300 MG  capsule Commonly known as: NEURONTIN Take 600 mg by mouth 3 (three) times daily.   HYDROmorphone 2 MG tablet Commonly known as: Dilaudid Take 1 tablet (2 mg total) by mouth every 4 (four) hours as needed for up to 5 days for severe pain.   methocarbamol 500 MG tablet Commonly known as: ROBAXIN Take 1 tablet (500 mg total) by mouth every 6 (six) hours as needed for muscle spasms.   oxyCODONE 20 mg 12 hr tablet Commonly known as: OXYCONTIN Take 20 mg by mouth every 12 (twelve) hours. What changed: Another medication with the same name was removed. Continue taking this medication, and follow the directions you see here.   pravastatin 40 MG tablet Commonly known as: PRAVACHOL Take 40 mg by mouth daily.   senna 8.6 MG tablet Commonly known as: SENOKOT Take 1 tablet by mouth daily.            Durable Medical Equipment  (From admission, onward)         Start     Ordered   06/27/19 1111  DME Walker rolling  Once    Question:  Patient needs a walker to treat with the following condition  Answer:  Status post total replacement of left hip   06/27/19 1111   06/27/19 1111  DME 3 n 1  Once     06/27/19 1111  Diagnostic Studies: Dg Pelvis Portable  Result Date: 06/27/2019 CLINICAL DATA:  Postop total hip. EXAM: PORTABLE PELVIS 1-2 VIEWS COMPARISON:  Radiograph June 9 20 FINDINGS: Interval LEFT hip total arthroplasty. Foley catheter noted. No fracture or dislocation. IMPRESSION: No complication following LEFT hip arthroplasty. Electronically Signed   By: Genevive BiStewart  Edmunds M.D.   On: 06/27/2019 10:24   Dg C-arm 1-60 Min-no Report  Result Date: 06/27/2019 Fluoroscopy was utilized by the requesting physician.  No radiographic interpretation.   Dg Hip Operative Unilat W Or W/o Pelvis Left  Result Date: 06/27/2019 CLINICAL DATA:  Left hip replacement EXAM: OPERATIVE LEFT HIP WITH PELVIS COMPARISON:  None. FLUOROSCOPY TIME:  Radiation Exposure Index (as provided by the  fluoroscopic device): 3.08 mGy If the device does not provide the exposure index: Fluoroscopy Time:  24 seconds Number of Acquired Images:  2 FINDINGS: Left hip replacement is noted in satisfactory position. No acute bony or soft tissue abnormality is noted. Foley catheter is noted within the bladder. IMPRESSION: Status post left hip replacement. Electronically Signed   By: Alcide CleverMark  Lukens M.D.   On: 06/27/2019 09:07    Patient benefited maximally from their hospital stay and there were no complications.     Disposition: Discharge disposition: 01-Home or Self Care      Discharge Instructions    Call MD / Call 911   Complete by: As directed    If you experience chest pain or shortness of breath, CALL 911 and be transported to the hospital emergency room.  If you develope a fever above 101 F, pus (white drainage) or increased drainage or redness at the wound, or calf pain, call your surgeon's office.   Constipation Prevention   Complete by: As directed    Drink plenty of fluids.  Prune juice may be helpful.  You may use a stool softener, such as Colace (over the counter) 100 mg twice a day.  Use MiraLax (over the counter) for constipation as needed.   Diet - low sodium heart healthy   Complete by: As directed    Increase activity slowly as tolerated   Complete by: As directed      Follow-up Information    Kathryne HitchBlackman, Christopher Y, MD. Schedule an appointment as soon as possible for a visit in 2 week(s).   Specialty: Orthopedic Surgery Contact information: [address] [place] [address] [phone]        [facility] up.   Specialty: Home Health Services Why: agency will provide home health physical therapy, agency will call you to schedule first visit. Contact information: [address] [place] [address] [phone]            Signed: Nadara MustardMarcus V Nary Sneed 06/29/2019, 10:28 AM

## 2019-06-29 NOTE — Progress Notes (Signed)
Physical Therapy Treatment Patient Details Name: Parker Daniel MRN: [id number] DOB: [birth year] Today's Date: 06/29/2019    History of Present Illness s/p L DA THA. PMH: HTN, gout, DM    PT Comments    Pt progressing, feels he is having more soreness today and discussed normalcy of this. Encouraged pt/wife to continue to mobilize at home and discussed importance of activity with regard to recovery.  Follow Up Recommendations  Follow surgeon's recommendation for DC plan and follow-up therapies     Equipment Recommendations  Rolling walker with 5" wheels    Recommendations for Other Services       Precautions / Restrictions Precautions Precautions: Fall Restrictions Weight Bearing Restrictions: No Other Position/Activity Restrictions: WBAT    Mobility  Bed Mobility               General bed mobility comments: pt on EOB  Transfers Overall transfer level: Needs assistance Equipment used: Rolling walker (2 wheeled) Transfers: Sit to/from Stand Sit to Stand: Supervision;Modified independent (Device/Increase time)         General transfer comment: pt self cues for hand placement   Ambulation/Gait Ambulation/Gait assistance: Supervision;Modified independent (Device/Increase time) Gait Distance (Feet): 100 Feet Assistive device: Rolling walker (2 wheeled) Gait Pattern/deviations: Step-to pattern;Decreased stance time - left     General Gait Details: cues for hip extension, trunk extension   Stairs             Wheelchair Mobility    Modified Rankin (Stroke Patients Only)       Balance                                            Cognition Arousal/Alertness: Awake/alert Behavior During Therapy: WFL for tasks assessed/performed Overall Cognitive Status: Within Functional Limits for tasks assessed                                        Exercises      General Comments        Pertinent Vitals/Pain Pain  Assessment: 0-10 Pain Score: 5  Pain Location: left hip Pain Descriptors / Indicators: Grimacing;Sore Pain Intervention(s): Limited activity within patient's tolerance;Monitored during session;Premedicated before session;Repositioned    Home Living                      Prior Function            PT Goals (current goals can now be found in the care plan section) Acute Rehab PT Goals Patient Stated Goal: home PT Goal Formulation: With patient Time For Goal Achievement: 07/04/19 Potential to Achieve Goals: Good Progress towards PT goals: Progressing toward goals    Frequency    7X/week      PT Plan Current plan remains appropriate    Co-evaluation              AM-PAC PT "6 Clicks" Mobility   Outcome Measure  Help needed turning from your back to your side while in a flat bed without using bedrails?: A Little Help needed moving from lying on your back to sitting on the side of a flat bed without using bedrails?: None Help needed moving to and from a bed to a chair (including a wheelchair)?: None Help needed standing up from  a chair using your arms (e.g., wheelchair or bedside chair)?: None Help needed to walk in hospital room?: None Help needed climbing 3-5 steps with a railing? : A Little 6 Click Score: 22    End of Session Equipment Utilized During Treatment: Gait belt Activity Tolerance: Patient tolerated treatment well Patient left: with family/visitor present(w/c) Nurse Communication: Mobility status PT Visit Diagnosis: Difficulty in walking, not elsewhere classified (R26.2)     Time: [id number] PT Time Calculation (min) (ACUTE ONLY): 12 min  Charges:  $Gait Training: 8-22 mins                     Parker Daniel, PT  Pager: [phone] Acute Rehab Dept [facility]): [phone]   06/29/2019    [facility] Surgery Center IncWILLIAMS,Parker Hazel 06/29/2019, 11:30 AM

## 2019-06-29 NOTE — Plan of Care (Signed)
  Problem: Clinical Measurements: Goal: Ability to maintain clinical measurements within normal limits will improve Outcome: Progressing   Problem: Clinical Measurements: Goal: Diagnostic test results will improve Outcome: Progressing   Problem: Clinical Measurements: Goal: Will remain free from infection Outcome: Progressing   Problem: Activity: Goal: Risk for activity intolerance will decrease Outcome: Progressing   Problem: Clinical Measurements: Goal: Cardiovascular complication will be avoided Outcome: Progressing   Problem: Nutrition: Goal: Adequate nutrition will be maintained Outcome: Progressing

## 2019-06-30 ENCOUNTER — Encounter (HOSPITAL_COMMUNITY): Payer: Self-pay | Admitting: Orthopaedic Surgery

## 2019-07-10 ENCOUNTER — Ambulatory Visit (INDEPENDENT_AMBULATORY_CARE_PROVIDER_SITE_OTHER): Payer: Medicare HMO | Admitting: Orthopaedic Surgery

## 2019-07-10 ENCOUNTER — Encounter: Payer: Self-pay | Admitting: Orthopaedic Surgery

## 2019-07-10 DIAGNOSIS — Z96642 Presence of left artificial hip joint: Secondary | ICD-10-CM

## 2019-07-10 MED ORDER — METHOCARBAMOL 500 MG PO TABS: 500.0000 mg | ORAL_TABLET | Freq: Four times a day (QID) | ORAL | 0 refills | Status: DC | PRN

## 2019-07-10 NOTE — Progress Notes (Signed)
The patient is 2 weeks tomorrow status post a left total hip replacement.  He is doing well overall.  He is ambulate with a cane.  He has a spinal home physical therapy visit tomorrow.  He is not needing any type a refill on pain medication.  He would like a refill on Robaxin.  He has been on aspirin twice a day.  On exam his incision looks good.  I replaced some old Steri-Strips and new Steri-Strips.  There is some firmness at the incision but no seroma is appreciated.  His leg lengths are equal.  We will continue increase his activities as comfort allows.  We will see him back in 4 weeks to see how is doing overall.  All question concerns were answered and addressed.  At his next visit he states he would like [place] to look at his knees.  At his next visit I would like an AP and lateral of both knees.

## 2019-08-07 ENCOUNTER — Ambulatory Visit (INDEPENDENT_AMBULATORY_CARE_PROVIDER_SITE_OTHER): Payer: Medicare HMO | Admitting: Orthopaedic Surgery

## 2019-08-07 ENCOUNTER — Ambulatory Visit: Payer: Self-pay

## 2019-08-07 DIAGNOSIS — G8929 Other chronic pain: Secondary | ICD-10-CM

## 2019-08-07 DIAGNOSIS — M25561 Pain in right knee: Secondary | ICD-10-CM | POA: Diagnosis not present

## 2019-08-07 DIAGNOSIS — M25562 Pain in left knee: Secondary | ICD-10-CM

## 2019-08-07 DIAGNOSIS — M1711 Unilateral primary osteoarthritis, right knee: Secondary | ICD-10-CM | POA: Diagnosis not present

## 2019-08-07 NOTE — Progress Notes (Signed)
Office Visit Note   Patient: Parker Daniel           Date of Birth: 05-15-1956           MRN: [id number] Visit Date: 08/07/2019              Requested by: No referring provider defined for this encounter. PCP: System, Pcp Not In   Assessment & Plan: Visit Diagnoses:  1. Chronic pain of left knee   2. Chronic pain of right knee   3. Unilateral primary osteoarthritis, right knee     Plan: The patient does have severe osteoarthritis of his right knee.  It is tricompartmental.  At this point our recommendation for treatment for that knee would be a knee replacement.  He has had other conservative treatment measures for the right knee.  With a knee replacement we could certainly improve his alignment and function of the knee.  I showed him a knee model and explained in detail what this involves in terms of knee replacement surgery.  We will continue to follow him conservatively for now as he is in recovery from his left total hip.  We do not need to see him back for 3 months.  At that visit I would like a low AP pelvis and lateral of his left operative hip.  We can talk more about the possible knee replacement surgery at that point if you would like to.  Follow-Up Instructions: Return in about 3 months (around 11/06/2019).   Orders:  Orders Placed This Encounter  Procedures  . XR Knee 1-2 Views Left  . XR Knee 1-2 Views Right   No orders of the defined types were placed in this encounter.     Procedures: No procedures performed   Clinical Data: No additional findings.   Subjective: Chief Complaint  Patient presents with  . Left Hip - Routine Post Op  . Right Knee - Pain  . Left Knee - Pain  The patient is coming in today 6 weeks status post a left total hip arthroplasty.  His left hip is doing well.  He wanted [place] to take a look at both knees today.  His right knee has been really bothering him quite a bit for about 8 years now.  He has not been able to fully extend or  straighten his knee for some time.  His left knee does not hurt as much is his right knee.  He reports that he had a twisting injury several years ago to his right knee and it is had problems ever since then.  He says his left total hip arthroplasty is doing very well.  He is walking without assistive device.  He says his right knee is really more of an issue for him.  HPI  Review of Systems He currently denies any headache, chest pain, shortness of breath, fever, chills, nausea, vomiting  Objective: Vital Signs: There were no vitals taken for this visit.  Physical Exam He is alert and orient x3 and in no acute distress Ortho Exam Examination of his left operative hip shows that he has fluid range of motion and has no significant difficulties with motion or pain.  Examination of his left knee shows good range of motion with no effusion.  The knee on the left side is ligamentously stable.  His right knee has a flexion contracture of about 5 degrees.  There is a mild effusion.  He has significant medial joint line tenderness and  significant patellofemoral crepitation. Specialty Comments:  No specialty comments available.  Imaging: Xr Knee 1-2 Views Left  Result Date: 08/07/2019 2 views of the left knee show only mild arthritic changes.  There is no acute findings.  The alignment is near neutral.  Xr Knee 1-2 Views Right  Result Date: 08/07/2019 3 views of the right knee show severe tricompartment arthritic changes.  There is varus malalignment.  There are large osteophytes in all 3 compartments.  There is complete loss of the medial joint space.    PMFS History: Patient Active Problem List   Diagnosis Date Noted  . Unilateral primary osteoarthritis, right knee 08/07/2019  . History of arthroplasty of left hip 06/28/2019  . Status post total replacement of left hip 06/27/2019  . Unilateral primary osteoarthritis, left hip 04/22/2019   Past Medical History:  Diagnosis Date  .  Arthritis   . Diabetes mellitus without complication ([place])   . Gout   . Hyperlipidemia   . Hypertension     Family History  Problem Relation Age of Onset  . Cancer Father        bladder    Past Surgical History:  Procedure Laterality Date  . APPENDECTOMY    . CHOLECYSTECTOMY    . KNEE SURGERY    . TONSILLECTOMY    . TOTAL HIP ARTHROPLASTY Left 06/27/2019   Procedure: LEFT TOTAL HIP ARTHROPLASTY ANTERIOR APPROACH;  Surgeon: Mcarthur Rossetti, MD;  Location: [facility];  Service: Orthopedics;  Laterality: Left;   Social History   Occupational History  . Not on file  Tobacco Use  . Smoking status: Never Smoker  . Smokeless tobacco: Never Used  Substance and Sexual Activity  . Alcohol use: Not Currently  . Drug use: Never  . Sexual activity: Yes    Birth control/protection: None

## 2019-08-13 ENCOUNTER — Encounter: Payer: Self-pay | Admitting: Orthopaedic Surgery

## 2019-08-13 ENCOUNTER — Ambulatory Visit (INDEPENDENT_AMBULATORY_CARE_PROVIDER_SITE_OTHER): Payer: Medicare HMO | Admitting: Orthopaedic Surgery

## 2019-08-13 ENCOUNTER — Ambulatory Visit (INDEPENDENT_AMBULATORY_CARE_PROVIDER_SITE_OTHER): Payer: Medicare HMO

## 2019-08-13 DIAGNOSIS — Z96642 Presence of left artificial hip joint: Secondary | ICD-10-CM | POA: Diagnosis not present

## 2019-08-13 DIAGNOSIS — M1711 Unilateral primary osteoarthritis, right knee: Secondary | ICD-10-CM

## 2019-08-13 MED ORDER — LIDOCAINE HCL 1 % IJ SOLN
3.0000 mL | INTRAMUSCULAR | Status: AC | PRN
  Administered 2019-08-13: 11:00:00 3 mL

## 2019-08-13 MED ORDER — METHYLPREDNISOLONE ACETATE 40 MG/ML IJ SUSP
40.0000 mg | INTRAMUSCULAR | Status: AC | PRN
  Administered 2019-08-13: 40 mg via INTRA_ARTICULAR

## 2019-08-13 NOTE — Progress Notes (Signed)
Office Visit Note   Patient: Parker Daniel           Date of Birth: 1955-12-24           MRN: [id number] Visit Date: 08/13/2019              Requested by: No referring provider defined for this encounter. PCP: System, Pcp Not In   Assessment & Plan: Visit Diagnoses:  1. Status post total replacement of left hip   2. Primary osteoarthritis of right knee     Plan: We will have him avoid the extreme external rotation of the left hip and bending or squatting motions.  Follow-up with [place] in a month.  Questions were encouraged and answered at length.  Follow-Up Instructions: Return in about 4 weeks (around 09/10/2019).   Orders:  Orders Placed This Encounter  Procedures  . Large Joint Inj: R knee  . XR HIP UNILAT W OR W/O PELVIS 2-3 VIEWS LEFT   No orders of the defined types were placed in this encounter.     Procedures: Large Joint Inj: R knee on 08/13/2019 10:30 AM Indications: pain Details: 22 G 1.5 in needle, anterolateral approach  Arthrogram: No  Medications: 3 mL lidocaine 1 %; 40 mg methylPREDNISolone acetate 40 MG/ML Outcome: tolerated well, no immediate complications Procedure, treatment alternatives, risks and benefits explained, specific risks discussed. Consent was given by the patient. Immediately prior to procedure a time out was called to verify the correct patient, procedure, equipment, support staff and site/side marked as required. Patient was prepped and draped in the usual sterile fashion.       Clinical Data: No additional findings.   Subjective: Chief Complaint  Patient presents with  . Left Hip - Pain, Follow-up  . Right Knee - Injections    HPI Parker Daniel comes in today requesting injection in his right knee he has known osteoarthritis in the knee.  He also states that his left hip which he has a left total hip arthroplasty performed on 06/27/2019 felt like it popped out whenever he was pulling weeds.  Then whenever he bent over to put a shoe  on the day felt like the hip popped out he feels like it feel pops out about three quarters of the way posteriorly.  He is now having pain in the left buttocks region.  Prior to yesterday the hip was doing well. Review of Systems Negative for fevers chills shortness of breath.  Objective: Vital Signs: There were no vitals taken for this visit.  Physical Exam General well-developed well-nourished male no acute distress. Ortho Exam Right knee good range of motion.  No abnormal warmth erythema or effusion. Left hip excellent range of motion without pain.  Extremes of external and internal rotation causes no discomfort no sensation of subluxation.  Leg lengths with slight contracture of the right knee definitely has a leg length discrepancy with the left leg being longer than the right. Specialty Comments:  No specialty comments available.  Imaging: No results found.   PMFS History: Patient Active Problem List   Diagnosis Date Noted  . Unilateral primary osteoarthritis, right knee 08/07/2019  . History of arthroplasty of left hip 06/28/2019  . Status post total replacement of left hip 06/27/2019  . Unilateral primary osteoarthritis, left hip 04/22/2019   Past Medical History:  Diagnosis Date  . Arthritis   . Diabetes mellitus without complication (HCC)   . Gout   . Hyperlipidemia   . Hypertension  Family History  Problem Relation Age of Onset  . Cancer Father        bladder    Past Surgical History:  Procedure Laterality Date  . APPENDECTOMY    . CHOLECYSTECTOMY    . KNEE SURGERY    . TONSILLECTOMY    . TOTAL HIP ARTHROPLASTY Left 06/27/2019   Procedure: LEFT TOTAL HIP ARTHROPLASTY ANTERIOR APPROACH;  Surgeon: Mcarthur Rossetti, MD;  Location: [facility];  Service: Orthopedics;  Laterality: Left;   Social History   Occupational History  . Not on file  Tobacco Use  . Smoking status: Never Smoker  . Smokeless tobacco: Never Used  Substance and Sexual Activity  .  Alcohol use: Not Currently  . Drug use: Never  . Sexual activity: Yes    Birth control/protection: None

## 2019-08-28 ENCOUNTER — Telehealth: Payer: Self-pay

## 2019-08-28 NOTE — Telephone Encounter (Signed)
Dental note faxed to them at [phone]

## 2019-08-28 NOTE — Telephone Encounter (Signed)
Parker Daniel with Dr. Brayton Layman and [facility] would like to know if patient requires pre-medication before dental appointment.  Patient had left hip surgery on 06/27/2019.  Cb# is [phone].  Please advise.  Thank you.

## 2019-09-04 ENCOUNTER — Encounter: Payer: Self-pay | Admitting: Orthopaedic Surgery

## 2019-09-15 ENCOUNTER — Other Ambulatory Visit: Payer: Self-pay

## 2019-09-15 ENCOUNTER — Encounter: Payer: Self-pay | Admitting: Orthopaedic Surgery

## 2019-09-15 ENCOUNTER — Ambulatory Visit (INDEPENDENT_AMBULATORY_CARE_PROVIDER_SITE_OTHER): Payer: Medicare HMO | Admitting: Orthopaedic Surgery

## 2019-09-15 DIAGNOSIS — Z96642 Presence of left artificial hip joint: Secondary | ICD-10-CM

## 2019-09-15 NOTE — Progress Notes (Signed)
Patient is just over 10 weeks status post a left total hip arthroplasty.  He does have severe arthritis in his right knee with a flexion contracture.  He does have episodes occasion when he is bending over and he feels like the hip is going to come out of place on the left side.  He says that really scares him when it happens but is not dislocated yet.  We x-rayed him at his last visit because of the symptoms and did not find any worrisome features on the plain films that we could see.  On clinical exam today I can still put his hip through internal and external rotation and compress it without difficulty and it does not feel unstable.  That leg is actually slightly longer than his other side due to the contraction on his right knee.  I want him to still be as cautious possible with posterior hip precautions based on what he is feeling but do not need to do anything about this thus far based on the fact that hip does not come out of place.  If this happens at all he knows to call us directly.  Otherwise I do not need to see him back for 3 months.  At that visit I like a standing low AP pelvis and a lateral of his left operative hip.  All question concerns were answered and addressed.

## 2019-11-03 ENCOUNTER — Ambulatory Visit (INDEPENDENT_AMBULATORY_CARE_PROVIDER_SITE_OTHER): Payer: Medicare HMO | Admitting: Orthopaedic Surgery

## 2019-11-03 ENCOUNTER — Ambulatory Visit (INDEPENDENT_AMBULATORY_CARE_PROVIDER_SITE_OTHER): Payer: Medicare HMO

## 2019-11-03 ENCOUNTER — Other Ambulatory Visit: Payer: Self-pay

## 2019-11-03 ENCOUNTER — Encounter: Payer: Self-pay | Admitting: Orthopaedic Surgery

## 2019-11-03 DIAGNOSIS — Z96642 Presence of left artificial hip joint: Secondary | ICD-10-CM | POA: Diagnosis not present

## 2019-11-03 DIAGNOSIS — M1711 Unilateral primary osteoarthritis, right knee: Secondary | ICD-10-CM | POA: Diagnosis not present

## 2019-11-03 MED ORDER — METHYLPREDNISOLONE 4 MG PO TABS: ORAL_TABLET | ORAL | 0 refills | Status: DC

## 2019-11-03 MED ORDER — DICLOFENAC SODIUM 75 MG PO TBEC: 75.0000 mg | DELAYED_RELEASE_TABLET | Freq: Two times a day (BID) | ORAL | 2 refills | Status: DC | PRN

## 2019-11-03 NOTE — Progress Notes (Signed)
The patient is now 4 months status post a left total hip arthroplasty.  He used to ambulate with a cane before surgery.  His hip is very stiff in the deformity was severe.  Since then he is doing well recovering from surgery.  He had one incident several months ago he felt like the hip was popping but that is gone away.  He does have known severe osteoarthritis in his right knee.  He does have some pain sleeping on his left hip at night or pain with the left hip with riding long distances but it seems to be more along the posterior aspect of the trochanteric area in the sciatic area where he has a source of his pain.  Sitting down I can put his left hip through internal and external rotation it is not stiff and moves fluidly and smoothly.  His right hip exam is normal.  His right knee does show mild effusion and he lacks full extension by few degrees.  His right knee has medial joint line tenderness and varus malalignment.  Some point he will probably want to proceed with a knee replacement on the right side.  I have given reassurance that his left hip is doing well and the pain he is experiencing is normal.  I will put him on a 6-day steroid taper and some diclofenac.  We will see him back in about 3 months to see how he is doing overall and to see if he wants to proceed with a right total knee arthroplasty by then.  All question concerns were answered addressed.  No x-rays are needed at his next visit.

## 2019-12-16 ENCOUNTER — Ambulatory Visit: Payer: Medicare HMO | Admitting: Orthopaedic Surgery

## 2019-12-16 ENCOUNTER — Other Ambulatory Visit: Payer: Self-pay

## 2019-12-16 ENCOUNTER — Telehealth: Payer: Self-pay

## 2019-12-16 ENCOUNTER — Encounter: Payer: Self-pay | Admitting: Orthopaedic Surgery

## 2019-12-16 DIAGNOSIS — Z96642 Presence of left artificial hip joint: Secondary | ICD-10-CM

## 2019-12-16 DIAGNOSIS — G8929 Other chronic pain: Secondary | ICD-10-CM | POA: Diagnosis not present

## 2019-12-16 DIAGNOSIS — M25561 Pain in right knee: Secondary | ICD-10-CM

## 2019-12-16 DIAGNOSIS — M1711 Unilateral primary osteoarthritis, right knee: Secondary | ICD-10-CM

## 2019-12-16 MED ORDER — LIDOCAINE HCL 1 % IJ SOLN
3.0000 mL | INTRAMUSCULAR | Status: AC | PRN
  Administered 2019-12-16: 11:00:00 3 mL

## 2019-12-16 MED ORDER — METHYLPREDNISOLONE ACETATE 40 MG/ML IJ SUSP
40.0000 mg | INTRAMUSCULAR | Status: AC | PRN
  Administered 2019-12-16: 11:00:00 40 mg via INTRA_ARTICULAR

## 2019-12-16 NOTE — Telephone Encounter (Signed)
Approved for Monovisc-right knee Dr. Kathrin Greathouse and Bill 20% OOP $25 copay [employer] auth required Cohere auth # [id number]

## 2019-12-16 NOTE — Telephone Encounter (Signed)
Right knee gel injection  

## 2019-12-16 NOTE — Telephone Encounter (Signed)
Submitted

## 2019-12-16 NOTE — Progress Notes (Signed)
Office Visit Note   Patient: Parker Daniel           Date of Birth: 04/22/1956           MRN: [id number] Visit Date: 12/16/2019              Requested by: No referring provider defined for this encounter. PCP: System, Pcp Not In   Assessment & Plan: Visit Diagnoses:  1. Status post total replacement of left hip   2. Chronic pain of right knee   3. Unilateral primary osteoarthritis, right knee     Plan: At this point he is a candidate for hyaluronic acid for his right knee to treat the pain from osteoarthritis.  I did place a steroid injection in his right knee today to help temporize him short-term while we wait on approval for hyaluronic acid for the right knee.  I do feel this is the next step for him as to see.  We will see him back in 4 weeks from today with hopefully providing hyaluronic acid into the right knee.  All question concerns were answered and addressed.  Follow-Up Instructions: Return in about 4 weeks (around 01/13/2020).   Orders:  Orders Placed This Encounter  Procedures  . Large Joint Inj   No orders of the defined types were placed in this encounter.     Procedures: Large Joint Inj: R knee on 12/16/2019 10:39 AM Indications: diagnostic evaluation and pain Details: 22 G 1.5 in needle, superolateral approach  Arthrogram: No  Medications: 3 mL lidocaine 1 %; 40 mg methylPREDNISolone acetate 40 MG/ML Outcome: tolerated well, no immediate complications Procedure, treatment alternatives, risks and benefits explained, specific risks discussed. Consent was given by the patient. Immediately prior to procedure a time out was called to verify the correct patient, procedure, equipment, support staff and site/side marked as required. Patient was prepped and draped in the usual sterile fashion.       Clinical Data: No additional findings.   Subjective: Chief Complaint  Patient presents with  . Left Hip - Follow-up  The patient is now 5 months out from a left  total hip arthroplasty.  Other than some slight trochanteric pain he has been doing well with the left hip.  He also has significant arthritis of his right knee.  We have at least tried 1 steroid injection remotely on the right knee.  His right knee does hurt him on a daily basis and he has decreased range of motion strength the right knee but improved range of motion and strength of the left operative hip.  HPI  Review of Systems He currently denies any headache, chest pain, shortness of breath, fever, chills, nausea, vomiting  Objective: Vital Signs: There were no vitals taken for this visit.  Physical Exam He is alert and orient x3 and in no acute distress Ortho Exam Examination of his left operative hip shows it has good range of motion and moves smoothly and it feels just like his right native hip.  His pain is minimal.  Examination of his right knee shows varus malalignment.  There is a slight effusion.  He lacks full extension by few degrees and full flexion by few degrees.  The knee is ligamentously stable. Specialty Comments:  No specialty comments available.  Imaging: No results found.   PMFS History: Patient Active Problem List   Diagnosis Date Noted  . Unilateral primary osteoarthritis, right knee 08/07/2019  . History of arthroplasty of left hip 06/28/2019  .  Status post total replacement of left hip 06/27/2019  . Unilateral primary osteoarthritis, left hip 04/22/2019   Past Medical History:  Diagnosis Date  . Arthritis   . Diabetes mellitus without complication (HCC)   . Gout   . Hyperlipidemia   . Hypertension     Family History  Problem Relation Age of Onset  . Cancer Father        bladder    Past Surgical History:  Procedure Laterality Date  . APPENDECTOMY    . CHOLECYSTECTOMY    . KNEE SURGERY    . TONSILLECTOMY    . TOTAL HIP ARTHROPLASTY Left 06/27/2019   Procedure: LEFT TOTAL HIP ARTHROPLASTY ANTERIOR APPROACH;  Surgeon: Kathryne Hitch,  MD;  Location: [facility];  Service: Orthopedics;  Laterality: Left;   Social History   Occupational History  . Not on file  Tobacco Use  . Smoking status: Never Smoker  . Smokeless tobacco: Never Used  Substance and Sexual Activity  . Alcohol use: Not Currently  . Drug use: Never  . Sexual activity: Yes    Birth control/protection: None

## 2019-12-16 NOTE — Telephone Encounter (Signed)
Submitted for VOB for Monovisc right knee injection

## 2019-12-16 NOTE — Telephone Encounter (Signed)
New [facility] patient

## 2019-12-17 ENCOUNTER — Telehealth: Payer: Self-pay

## 2019-12-17 NOTE — Telephone Encounter (Signed)
Updated cohere auth # [id number] Dates 12/17/19-06/15/20

## 2020-01-13 ENCOUNTER — Ambulatory Visit: Payer: Medicare HMO | Admitting: Orthopaedic Surgery

## 2020-01-19 ENCOUNTER — Ambulatory Visit: Payer: Medicare HMO | Admitting: Orthopaedic Surgery

## 2020-01-19 ENCOUNTER — Other Ambulatory Visit: Payer: Self-pay

## 2020-01-19 ENCOUNTER — Encounter: Payer: Self-pay | Admitting: Orthopaedic Surgery

## 2020-01-19 DIAGNOSIS — M1711 Unilateral primary osteoarthritis, right knee: Secondary | ICD-10-CM | POA: Diagnosis not present

## 2020-01-19 MED ORDER — HYALURONAN 88 MG/4ML IX SOSY
88.0000 mg | PREFILLED_SYRINGE | INTRA_ARTICULAR | Status: AC | PRN
  Administered 2020-01-19: 88 mg via INTRA_ARTICULAR

## 2020-01-19 NOTE — Progress Notes (Signed)
   Procedure Note  Patient: TORRIN CRIHFIELD             Date of Birth: [birth year]           MRN: [id number]             Visit Date: 01/19/2020  Procedures: Visit Diagnoses:  1. Unilateral primary osteoarthritis, right knee     Large Joint Inj: R knee on 01/19/2020 1:41 PM Indications: diagnostic evaluation and pain Details: 22 G 1.5 in needle, superolateral approach  Arthrogram: No  Medications: 88 mg Hyaluronan 88 MG/4ML Outcome: tolerated well, no immediate complications Procedure, treatment alternatives, risks and benefits explained, specific risks discussed. Consent was given by the patient. Immediately prior to procedure a time out was called to verify the correct patient, procedure, equipment, support staff and site/side marked as required. Patient was prepped and draped in the usual sterile fashion.    The patient comes in today for scheduled Monovisc injection to treat the pain from osteoarthritis in his right knee.  This is been well-documented.  He does have significant arthritis of his right knee.  He has failed conservative treatment including steroid injections.  He understands why we are trying this today.  His knee does have a slight flexion contracture on the right side with varus malalignment.  When he gets up from a seated position he limps due to the pain of osteoarthritis.  He did tolerate the Monovisc injection well.  All questions and concerns were answered and addressed.  He understands that if this fails our recommendation would be knee replacement surgery.  Follow-up will otherwise be as needed.

## 2020-02-02 ENCOUNTER — Ambulatory Visit: Payer: Medicare HMO | Admitting: Orthopaedic Surgery

## 2020-02-18 ENCOUNTER — Other Ambulatory Visit: Payer: Self-pay | Admitting: Orthopaedic Surgery

## 2021-01-14 DIAGNOSIS — I5022 Chronic systolic (congestive) heart failure: Secondary | ICD-10-CM | POA: Insufficient documentation

## 2021-02-15 DIAGNOSIS — Z7901 Long term (current) use of anticoagulants: Secondary | ICD-10-CM | POA: Insufficient documentation

## 2021-11-03 ENCOUNTER — Emergency Department (HOSPITAL_COMMUNITY): Payer: Medicare HMO

## 2021-11-03 ENCOUNTER — Other Ambulatory Visit: Payer: Self-pay

## 2021-11-03 ENCOUNTER — Emergency Department (HOSPITAL_COMMUNITY)
Admission: EM | Admit: 2021-11-03 | Discharge: 2021-11-03 | Disposition: A | Discharge status: Home / Self Care | Payer: Medicare HMO | Attending: Emergency Medicine | Admitting: Emergency Medicine

## 2021-11-03 ENCOUNTER — Encounter (HOSPITAL_COMMUNITY): Payer: Self-pay | Admitting: Emergency Medicine

## 2021-11-03 DIAGNOSIS — I10 Essential (primary) hypertension: Secondary | ICD-10-CM | POA: Insufficient documentation

## 2021-11-03 DIAGNOSIS — R3121 Asymptomatic microscopic hematuria: Secondary | ICD-10-CM | POA: Diagnosis not present

## 2021-11-03 DIAGNOSIS — R531 Weakness: Secondary | ICD-10-CM | POA: Diagnosis not present

## 2021-11-03 DIAGNOSIS — Z7982 Long term (current) use of aspirin: Secondary | ICD-10-CM | POA: Diagnosis not present

## 2021-11-03 DIAGNOSIS — R112 Nausea with vomiting, unspecified: Secondary | ICD-10-CM | POA: Insufficient documentation

## 2021-11-03 DIAGNOSIS — Z96642 Presence of left artificial hip joint: Secondary | ICD-10-CM | POA: Insufficient documentation

## 2021-11-03 DIAGNOSIS — E119 Type 2 diabetes mellitus without complications: Secondary | ICD-10-CM | POA: Diagnosis not present

## 2021-11-03 DIAGNOSIS — R410 Disorientation, unspecified: Secondary | ICD-10-CM | POA: Insufficient documentation

## 2021-11-03 DIAGNOSIS — Z20822 Contact with and (suspected) exposure to covid-19: Secondary | ICD-10-CM | POA: Insufficient documentation

## 2021-11-03 DIAGNOSIS — R0602 Shortness of breath: Secondary | ICD-10-CM | POA: Diagnosis present

## 2021-11-03 DIAGNOSIS — J189 Pneumonia, unspecified organism: Secondary | ICD-10-CM

## 2021-11-03 DIAGNOSIS — J181 Lobar pneumonia, unspecified organism: Secondary | ICD-10-CM | POA: Insufficient documentation

## 2021-11-03 LAB — CBC WITH DIFFERENTIAL/PLATELET
Abs Immature Granulocytes: 0.05 10*3/uL (ref 0.00–0.07)
Basophils Absolute: 0 10*3/uL (ref 0.0–0.1)
Basophils Relative: 0 %
Eosinophils Absolute: 0 10*3/uL (ref 0.0–0.5)
Eosinophils Relative: 0 %
HCT: 37.6 % — ABNORMAL LOW (ref 39.0–52.0)
Hemoglobin: 12 g/dL — ABNORMAL LOW (ref 13.0–17.0)
Immature Granulocytes: 1 %
Lymphocytes Relative: 6 %
Lymphs Abs: 0.6 10*3/uL — ABNORMAL LOW (ref 0.7–4.0)
MCH: 30.3 pg (ref 26.0–34.0)
MCHC: 31.9 g/dL (ref 30.0–36.0)
MCV: 94.9 fL (ref 80.0–100.0)
Monocytes Absolute: 1 10*3/uL (ref 0.1–1.0)
Monocytes Relative: 9 %
Neutro Abs: 8.6 10*3/uL — ABNORMAL HIGH (ref 1.7–7.7)
Neutrophils Relative %: 84 %
Platelets: 166 10*3/uL (ref 150–400)
RBC: 3.96 MIL/uL — ABNORMAL LOW (ref 4.22–5.81)
RDW: 13.6 % (ref 11.5–15.5)
WBC: 10.2 10*3/uL (ref 4.0–10.5)
nRBC: 0 % (ref 0.0–0.2)

## 2021-11-03 LAB — COMPREHENSIVE METABOLIC PANEL
ALT: 27 U/L (ref 0–44)
AST: 23 U/L (ref 15–41)
Albumin: 4.1 g/dL (ref 3.5–5.0)
Alkaline Phosphatase: 59 U/L (ref 38–126)
Anion gap: 8 (ref 5–15)
BUN: 22 mg/dL (ref 8–23)
CO2: 24 mmol/L (ref 22–32)
Calcium: 9 mg/dL (ref 8.9–10.3)
Chloride: 104 mmol/L (ref 98–111)
Creatinine, Ser: 1.09 mg/dL (ref 0.61–1.24)
GFR, Estimated: >60 mL/min (ref >60)
Glucose, Bld: 137 mg/dL — ABNORMAL HIGH (ref 70–99)
Potassium: 3.2 mmol/L — ABNORMAL LOW (ref 3.5–5.1)
Sodium: 136 mmol/L (ref 135–145)
Total Bilirubin: 1 mg/dL (ref 0.3–1.2)
Total Protein: 7.5 g/dL (ref 6.5–8.1)

## 2021-11-03 LAB — URINALYSIS, ROUTINE W REFLEX MICROSCOPIC
Bacteria, UA: NONE SEEN
Bilirubin Urine: NEGATIVE
Glucose, UA: NEGATIVE mg/dL
Ketones, ur: NEGATIVE mg/dL
Leukocytes,Ua: NEGATIVE
Nitrite: NEGATIVE
Protein, ur: NEGATIVE mg/dL
RBC / HPF: >50 RBC/hpf — ABNORMAL HIGH (ref 0–5)
Specific Gravity, Urine: 1.02 (ref 1.005–1.030)
pH: 6 (ref 5.0–8.0)

## 2021-11-03 LAB — RESP PANEL BY RT-PCR (FLU A&B, COVID) ARPGX2
Influenza A by PCR: NEGATIVE
Influenza B by PCR: NEGATIVE
SARS Coronavirus 2 by RT PCR: NEGATIVE

## 2021-11-03 LAB — LACTIC ACID, PLASMA: Lactic Acid, Venous: 1.6 mmol/L (ref 0.5–1.9)

## 2021-11-03 LAB — PROTIME-INR
INR: 1.1 (ref 0.8–1.2)
Prothrombin Time: 13.8 seconds (ref 11.4–15.2)

## 2021-11-03 LAB — APTT: aPTT: 24 seconds (ref 24–36)

## 2021-11-03 MED ORDER — SODIUM CHLORIDE (PF) 0.9 % IJ SOLN
INTRAMUSCULAR | Status: AC
  Filled 2021-11-03: qty 50

## 2021-11-03 MED ORDER — IOHEXOL 350 MG/ML SOLN
100.0000 mL | Freq: Once | INTRAVENOUS | Status: AC | PRN
  Administered 2021-11-03: 16:00:00 100 mL via INTRAVENOUS

## 2021-11-03 MED ORDER — AMOXICILLIN 500 MG PO CAPS
1000.0000 mg | ORAL_CAPSULE | Freq: Once | ORAL | Status: AC
  Administered 2021-11-03: 16:00:00 1000 mg via ORAL
  Filled 2021-11-03: qty 2

## 2021-11-03 MED ORDER — AMOXICILLIN 500 MG PO CAPS: 1000.0000 mg | ORAL_CAPSULE | Freq: Three times a day (TID) | ORAL | 0 refills | Status: AC

## 2021-11-03 MED ORDER — DOXYCYCLINE HYCLATE 100 MG PO CAPS: 100.0000 mg | ORAL_CAPSULE | Freq: Two times a day (BID) | ORAL | 0 refills | Status: DC

## 2021-11-03 MED ORDER — DOXYCYCLINE HYCLATE 100 MG PO TABS
100.0000 mg | ORAL_TABLET | Freq: Once | ORAL | Status: AC
  Administered 2021-11-03: 16:00:00 100 mg via ORAL
  Filled 2021-11-03: qty 1

## 2021-11-03 NOTE — ED Triage Notes (Signed)
Arrives via EMS from home, complains of fever, body aches, SOB yesterday. Denies N/V/D.  Temporal temp was 104.1 w/ EMS, also reports trouble w/ urination. Alert and oriented x3, confused as to event per EMS. 92% RA, EMS placed on 3 L and O2 was 96%. EMS gave 500 of NS IV.   BP 155/95 HR 100 CBG 192 T 104.1 O2 92% RA  20 R AC

## 2021-11-03 NOTE — ED Notes (Signed)
Pt states understanding of dc instructions, importance of follow up, and prescriptions. Pt denies questions or concerns upon dc. Pt assisted out of ed via wheelchair w/ ed tech. No belongings left in room upon dc.

## 2021-11-03 NOTE — ED Notes (Signed)
Pt ambulated to the bathroom w/ pulse ox monitoring. Pt sating between 90-93% on RA w/ ambulation. Pt ambulated w/ out need for assistance.

## 2021-11-03 NOTE — ED Provider Notes (Signed)
Emergency Medicine Provider Triage Evaluation Note  Parker Daniel , a 65 y.o. male  was evaluated in triage.  Level 5 caveat: Altered mental status.  Per EMS the patient is arriving from home with complaints of fever, body aches, shortness of breath since yesterday.  Per EMS the patient was denying nausea, vomiting, diarrhea.  They had a temporal temp of 104.1.  Patient also reported difficulty urinating.  Per EMS the patient was 92% on room air, patient was placed on 3 L and was 96%.  EMS gave 500 of normal saline.  On my exam the patient is oriented to self, place but he is unsure why he is here.  He has difficulty finding words.  He denies any abdominal pain, chest pain, shortness of breath.  He does complain of pain in the right calf.  He states that his wife called EMS for him, but again cannot tell me why.  Review of Systems  Positive: See above Negative:   Physical Exam  Ht 5\' 8"  (1.727 m)    Wt 100 kg    BMI 33.52 kg/m  Gen:   Awake, no distress, oriented to self, place, time, disoriented to situation Resp:  Normal effort, decreased lung sounds on the right MSK:   Moves extremities without difficulty  Other:  Abdomen is rounded, soft, nontender.  Bowel sounds present.  S1/S2 with tachycardia. He does complain of right calf pain.  There is no obvious redness, swelling, injury present. Medical Decision Making  Medically screening exam initiated at 12:04 PM.  Appropriate orders placed.  was informed that the remainder of the evaluation will be completed by another provider, this initial triage assessment does not replace that evaluation, and the importance of remaining in the ED until their evaluation is complete.     Deborra Medina, PA-C 11/03/21 1207    1208, MD 11/04/21 [phone]

## 2021-11-03 NOTE — ED Provider Notes (Signed)
[facility]-EMERGENCY DEPT Provider Note   CSN: [id number] Arrival date & time: 11/03/21  1137     History Chief Complaint  Patient presents with   Fever   Altered Mental Status    Parker Daniel is a 65 y.o. male.  HPI     65 year old male with history of diabetes, hypertension, hyperlipidemia, kidney stones comes in with chief complaint of nausea, vomiting, weakness and confusion.  Patient was feeling well until this morning.  This morning he woke up and felt nauseated.  Patient had several episodes of emesis, and per wife remained in the bathroom for over an hour.  When she got to him, patient appeared confused and sluggish to her therefore they decided to come to the ER.  Patient is now more alert than he was at home.  She indicates that patient has had some cough and shortness of breath, but nothing too concerning.  There was no fever yesterday but today he had a fever of 104 per EMS evaluation.  No COVID-19 or flu exposures.  Patient denies any headache, neck pain, focal weakness or numbness.  He had some [place] takeout food yesterday, but the wife had the same food and is not having any GI symptoms.  No associated diarrhea.  Past Medical History:  Diagnosis Date   Arthritis    Diabetes mellitus without complication (HCC)    Gout    Hyperlipidemia    Hypertension     Patient Active Problem List   Diagnosis Date Noted   Unilateral primary osteoarthritis, right knee 08/07/2019   History of arthroplasty of left hip 06/28/2019   Status post total replacement of left hip 06/27/2019   Unilateral primary osteoarthritis, left hip 04/22/2019    Past Surgical History:  Procedure Laterality Date   APPENDECTOMY     CHOLECYSTECTOMY     KNEE SURGERY     TONSILLECTOMY     TOTAL HIP ARTHROPLASTY Left 06/27/2019   Procedure: LEFT TOTAL HIP ARTHROPLASTY ANTERIOR APPROACH;  Surgeon: Kathryne Hitch, MD;  Location: [facility];  Service: Orthopedics;   Laterality: Left;       Family History  Problem Relation Age of Onset   Cancer Father        bladder    Social History   Tobacco Use   Smoking status: Never   Smokeless tobacco: Never  Substance Use Topics   Alcohol use: Not Currently   Drug use: Never    Home Medications Prior to Admission medications   Medication Sig Start Date End Date Taking? Authorizing Provider  amoxicillin (AMOXIL) 500 MG capsule Take 2 capsules (1,000 mg total) by mouth 3 (three) times daily for 7 days. 11/03/21 11/10/21 Yes Derwood Kaplan, MD  doxycycline (VIBRAMYCIN) 100 MG capsule Take 1 capsule (100 mg total) by mouth 2 (two) times daily. 11/03/21  Yes Derwood Kaplan, MD  allopurinol (ZYLOPRIM) 300 MG tablet Take 300 mg by mouth daily.    [provider]  aspirin 81 MG chewable tablet Chew 1 tablet (81 mg total) by mouth 2 (two) times daily. 06/28/19   Kathryne Hitch, MD  diclofenac (VOLTAREN) 75 MG EC tablet TAKE 1 TABLET BY MOUTH TWICE DAILY AS NEEDED 02/18/20   Kathryne Hitch, MD  ferrous sulfate 325 (65 FE) MG tablet Take 325 mg by mouth daily with breakfast.    [provider]  gabapentin (NEURONTIN) 300 MG capsule Take 600 mg by mouth 3 (three) times daily.    [provider]  methocarbamol (ROBAXIN) 500 MG tablet Take 1 tablet (500 mg total) by mouth every 6 (six) hours as needed for muscle spasms. 07/10/19   Mcarthur Rossetti, MD  methylPREDNISolone (MEDROL) 4 MG tablet Medrol dose pack. Take as instructed 11/03/19   Mcarthur Rossetti, MD  oxyCODONE (OXYCONTIN) 20 mg 12 hr tablet Take 20 mg by mouth every 12 (twelve) hours.    [provider]  pravastatin (PRAVACHOL) 40 MG tablet Take 40 mg by mouth daily. 02/20/19   [provider]  senna (SENOKOT) 8.6 MG tablet Take 1 tablet by mouth daily.    [provider]    Allergies    Patient has no known allergies.  Review of Systems   Review of Systems   Constitutional:  Positive for activity change, chills, fatigue and fever.  Respiratory:  Positive for cough and shortness of breath.   Cardiovascular:  Negative for chest pain.  Gastrointestinal:  Positive for nausea and vomiting. Negative for diarrhea.  Neurological:  Negative for seizures, speech difficulty, numbness and headaches.  All other systems reviewed and are negative.  Physical Exam Updated Vital Signs BP 124/68    Pulse 85    Temp 99.1 F (37.3 C) (Oral)    Resp (!) 26    Ht 5\' 8"  (1.727 m)    Wt 100 kg    SpO2 94%    BMI 33.52 kg/m   Physical Exam Vitals and nursing note reviewed.  Constitutional:      Appearance: He is well-developed.  HENT:     Head: Atraumatic.  Eyes:     Extraocular Movements: Extraocular movements intact.     Pupils: Pupils are equal, round, and reactive to light.  Cardiovascular:     Rate and Rhythm: Normal rate.  Pulmonary:     Effort: Pulmonary effort is normal.     Comments: Diminished breath sounds in the right lower lung base Musculoskeletal:     Cervical back: Neck supple.  Skin:    General: Skin is warm.  Neurological:     Mental Status: He is alert and oriented to person, place, and time.     Cranial Nerves: No cranial nerve deficit.     Sensory: No sensory deficit.    ED Results / Procedures / Treatments   Labs (all labs ordered are listed, but only abnormal results are displayed) Labs Reviewed  COMPREHENSIVE METABOLIC PANEL - Abnormal; Notable for the following components:      Result Value   Potassium 3.2 (*)    Glucose, Bld 137 (*)    All other components within normal limits  CBC WITH DIFFERENTIAL/PLATELET - Abnormal; Notable for the following components:   RBC 3.96 (*)    Hemoglobin 12.0 (*)    HCT 37.6 (*)    Neutro Abs 8.6 (*)    Lymphs Abs 0.6 (*)    All other components within normal limits  URINALYSIS, ROUTINE W REFLEX MICROSCOPIC - Abnormal; Notable for the following components:   Hgb urine dipstick LARGE  (*)    RBC / HPF >50 (*)    All other components within normal limits  RESP PANEL BY RT-PCR (FLU A&B, COVID) ARPGX2  CULTURE, BLOOD (ROUTINE X 2)  CULTURE, BLOOD (ROUTINE X 2)  URINE CULTURE  LACTIC ACID, PLASMA  PROTIME-INR  APTT  LACTIC ACID, PLASMA    EKG EKG Interpretation  Date/Time:  Thursday November 03 2021 12:13:01 EST Ventricular Rate:  102 PR Interval:  151 QRS Duration: 98  QT Interval:  307 QTC Calculation: 400 R Axis:   7 Text Interpretation: Sinus tachycardia Repol abnrm suggests ischemia, anterolateral inferior and lateral TWI - new Confirmed by Varney Biles [phone]) on 11/03/2021 2:32:07 PM  Radiology CT Angio Chest PE W and/or Wo Contrast  Result Date: 11/03/2021 CLINICAL DATA:  Shortness of breath fever and body aches. EXAM: CT ANGIOGRAPHY CHEST WITH CONTRAST TECHNIQUE: Multidetector CT imaging of the chest was performed using the standard protocol during bolus administration of intravenous contrast. Multiplanar CT image reconstructions and MIPs were obtained to evaluate the vascular anatomy. CONTRAST:  164mL OMNIPAQUE IOHEXOL 350 MG/ML SOLN COMPARISON:  Chest radiograph performed earlier on the same date FINDINGS: Cardiovascular: Satisfactory opacification of the pulmonary arteries to the segmental level. No evidence of pulmonary embolism. The heart is enlarged. No pericardial effusion. Mediastinum/Nodes: No enlarged mediastinal, hilar, or axillary lymph nodes. Thyroid gland, trachea, and esophagus demonstrate no significant findings. Lungs/Pleura: There are multiple peribronchial opacities predominantly in the right lung in a tree in bud appearance consistent with bronchopneumonia. There are confluent patchy opacities in the right lower lobe. The left lung is clear. Upper Abdomen: Status post cholecystectomy. Mild pancreatic atrophy. Fatty infiltration of the liver. Musculoskeletal: Advanced multilevel DA and disc disease of the thoracic spine with prominent  osteophytes. Review of the MIP images confirms the above findings. IMPRESSION: 1. Tree-in-bud opacities in the right lung as well as confluent opacities in the right lung base consistent with pneumonia predominantly involving the right lung. 2. No evidence of pulmonary embolism. 3. Cardiomegaly. 4. Hepatic steatosis. 5. 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 Electronically Signed   By: Keane Police D.O.   On: 11/03/2021 16:07   DG Chest Port 1 View  Result Date: 11/03/2021 CLINICAL DATA:  Questionable sepsis EXAM: PORTABLE CHEST 1 VIEW COMPARISON:  04/01/2019 FINDINGS: Mild cardiomegaly. Heterogeneous airspace opacity of the right lung base. Mild, diffuse interstitial opacity. The visualized skeletal structures are unremarkable. IMPRESSION: 1. Heterogeneous airspace opacity of the right lung base, which may reflect atelectasis or consolidation. 2. Mild, diffuse interstitial opacity, which may reflect edema or infection in the setting of cardiomegaly. Electronically Signed   By: Delanna Ahmadi M.D.   On: 11/03/2021 12:30    Procedures Procedures   Medications Ordered in ED Medications  iohexol (OMNIPAQUE) 350 MG/ML injection 100 mL (100 mLs Intravenous Contrast Given 11/03/21 1544)  sodium chloride (PF) 0.9 % injection (  Given by Other 11/03/21 1600)  amoxicillin (AMOXIL) capsule 1,000 mg (1,000 mg Oral Given 11/03/21 1627)  doxycycline (VIBRA-TABS) tablet 100 mg (100 mg Oral Given 11/03/21 1627)    ED Course  I have reviewed the triage vital signs and the nursing notes.  Pertinent labs & imaging results that were available during my care of the patient were reviewed by me and considered in my medical decision making (see chart for details).    MDM Rules/Calculators/A&P                          65 year old male comes in with chief complaint of confusion, fevers, chills, generalized weakness.  He is here with his wife.  They both report that he was doing well last night,  this morning he woke up with nausea and vomiting and had generalized weakness and confusion.  Patient is AOx3 at this time.  No focal neurodeficits.   Patient reports some shortness of breath and cough, but nothing pronounced.  On exam there is some  right lower lung field reduced breath sounds.  He has history of A. fib and is on blood thinner, therefore PE suspicion is low.  In the room we noted that patient's O2 sats were in the low 90s on room air, with oxygen they will go up to 98%.   Concerns for COVID-19 or flu initially.  Basic labs ordered along with respiratory swab and they were negative for COVID, flu and the chest x-ray did not reveal any specific pathology.  Patient's records indicated history of multifocal pneumonia -therefore we proceeded with CT scan to rule out pneumonia versus PE given the low oxygen and weight history.  CT scan does show bronchopneumonia.  Patient will receive amoxicillin and doxycycline for it.  Although there were some complains about difficulty voiding, patient has been able to void in the ER without problem and had no voiding issues last night.  His urine is showing hematuria.  Wife reports that patient has had hematuria in the past because of kidney stones.  Again, no flank pain or abdominal pain with this visit.  Clinically, he is not having a stone.  I informed him to make sure that he follows up with his PCP with hematuria and make sure it clears up.  Without any headaches, meningismus, subacute or acute decompensation -suspicion for meningitis is quite low.  Strict ER return precautions have been discussed, and patient is agreeing with the plan and is comfortable with the workup done and the recommendations from the ER.      Final Clinical Impression(s) / ED Diagnoses Final diagnoses:  Community acquired pneumonia of right lower lobe of lung  Asymptomatic microscopic hematuria    Rx / DC Orders ED Discharge Orders          Ordered     amoxicillin (AMOXIL) 500 MG capsule  3 times daily        11/03/21 1622    doxycycline (VIBRAMYCIN) 100 MG capsule  2 times daily        11/03/21 1622             Varney Biles, MD 11/03/21 1651

## 2021-11-03 NOTE — Discharge Instructions (Addendum)
You are seen in the ER for nausea, vomiting, fevers.   Your work-up reveals pneumonia on the right side of the lung fields.  Start taking the antibiotics that are prescribed.   Please return to the ER if your symptoms worsen; you have increased pain, fevers, chills, inability to keep any medications down, confusion. Otherwise see the outpatient doctor as requested.

## 2021-11-04 LAB — URINE CULTURE: Culture: NO GROWTH

## 2021-11-08 LAB — CULTURE, BLOOD (ROUTINE X 2)
Culture: NO GROWTH
Culture: NO GROWTH

## 2021-12-30 ENCOUNTER — Other Ambulatory Visit: Payer: Self-pay | Admitting: Urology

## 2022-01-04 ENCOUNTER — Ambulatory Visit: Payer: Medicare HMO | Admitting: Orthopaedic Surgery

## 2022-01-04 ENCOUNTER — Other Ambulatory Visit: Payer: Self-pay

## 2022-01-04 ENCOUNTER — Ambulatory Visit (INDEPENDENT_AMBULATORY_CARE_PROVIDER_SITE_OTHER): Payer: Medicare HMO

## 2022-01-04 ENCOUNTER — Encounter: Payer: Self-pay | Admitting: Orthopaedic Surgery

## 2022-01-04 VITALS — Ht 71.0 in | Wt 269.0 lb

## 2022-01-04 DIAGNOSIS — M1711 Unilateral primary osteoarthritis, right knee: Secondary | ICD-10-CM

## 2022-01-04 DIAGNOSIS — M25512 Pain in left shoulder: Secondary | ICD-10-CM

## 2022-01-04 DIAGNOSIS — G8929 Other chronic pain: Secondary | ICD-10-CM

## 2022-01-04 MED ORDER — LIDOCAINE HCL 1 % IJ SOLN
3.0000 mL | INTRAMUSCULAR | Status: AC | PRN
  Administered 2022-01-04: 3 mL

## 2022-01-04 MED ORDER — METHYLPREDNISOLONE ACETATE 40 MG/ML IJ SUSP
40.0000 mg | INTRAMUSCULAR | Status: AC | PRN
  Administered 2022-01-04: 40 mg via INTRA_ARTICULAR

## 2022-01-04 NOTE — Progress Notes (Signed)
Office Visit Note   Patient: Parker Daniel           Date of Birth: [birth year]           MRN: [id number] Visit Date: 01/04/2022              Requested by: No referring provider defined for this encounter. PCP: System, Provider Not In   Assessment & Plan: Visit Diagnoses:  1. Unilateral primary osteoarthritis, right knee   2. Chronic left shoulder pain     Plan: I did discuss knee replacement surgery with him in detail for his right knee.  We talked about the intraoperative and postoperative course.  I showed him a knee replacement model and described the risk and benefits of the surgery.  At this point he is interested in proceeding with knee replacement surgery for the right knee given the failure of conservative treatment and debilitating pain is causing him combined with how it is detriment affecting his gait and I think this is also making his left hip and left low back worse.  I did offer him a steroid injection left shoulder subacromial outlet and he agreed to this and tolerated well.  We will work on getting surgery scheduled for a right total knee replacement.  Follow-Up Instructions: Return for 2 weeks post-op.   Orders:  Orders Placed This Encounter  Procedures   Large Joint Inj   XR Shoulder Left   XR Knee 1-2 Views Right   No orders of the defined types were placed in this encounter.     Procedures: Large Joint Inj: L subacromial bursa on 01/04/2022 2:04 PM Indications: pain and diagnostic evaluation Details: 22 G 1.5 in needle  Arthrogram: No  Medications: 3 mL lidocaine 1 %; 40 mg methylPREDNISolone acetate 40 MG/ML Outcome: tolerated well, no immediate complications Procedure, treatment alternatives, risks and benefits explained, specific risks discussed. Consent was given by the patient. Immediately prior to procedure a time out was called to verify the correct patient, procedure, equipment, support staff and site/side marked as required. Patient was prepped  and draped in the usual sterile fashion.      Clinical Data: No additional findings.   Subjective: Chief Complaint  Patient presents with   Left Shoulder - Pain   Right Knee - Pain  The patient is well-known to me.  He has well-documented severe end-stage arthritis of his right knee which is getting worse.  I last x-rayed his knee in 2020 and even then it was varus malalignment and end-stage.  I have replaced his left hip before.  He does have some left hip sciatica and left hip bursitis and this seems to be worsened due to the fact that he walks with significant limp to his gait as he is dealing with his knee malalignment of the right side.  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  He is on chronic pain medicines as well.  He is also on disability as it relates to his chronic pain.  His knee has been significantly worse to the point he does wish at this point to proceed with knee replacement surgery for the right knee.  He has had no acute change in medical status.  He is prediabetic.  His BMI is 37.52.  At this point his right knee pain is 10 out of 10 and he is failed all forms of conservative treatment including multiple steroid injections in that knee.  He has also been dealing with left  shoulder pain for about 6 months now with no injury.  Hurts with overhead activities and reaching behind him.  HPI  Review of Systems There is currently listed no headache, chest pain, shortness of breath, fever, chills, nausea, vomiting  Objective: Vital Signs: Ht 5\' 11"  (1.803 m)    Wt 269 lb (122 kg)    BMI 37.52 kg/m   Physical Exam He is alert and orient x3 and in no acute distress.  He walks with a significant limp.  There is obvious varus malalignment of his right knee when he walks.  This is definitely affecting his gait. Ortho Exam Examination the right knee shows significant varus malalignment that is not correctable.  He has significant patellofemoral crepitation and good  range of motion of the knee but painful throughout the arc of motion.  Examination of his left shoulder does show some weakness with abduction but good motion overall.  He has positive Neer and Hawkins signs and pain in that shoulder with activities. Specialty Comments:  No specialty comments available.  Imaging: XR Knee 1-2 Views Right  Result Date: 01/04/2022 2 views of the right knee show severe end-stage arthritis of the right knee.  There is complete bone-on-bone wear of the patellofemoral joint and the medial compartment the knee with varus malalignment and osteophytes in all 3 compartments.  XR Shoulder Left  Result Date: 01/04/2022 3 views of the left shoulder show well located shoulder with no complicating features.    PMFS History: Patient Active Problem List   Diagnosis Date Noted   Unilateral primary osteoarthritis, right knee 08/07/2019   History of arthroplasty of left hip 06/28/2019   Status post total replacement of left hip 06/27/2019   Unilateral primary osteoarthritis, left hip 04/22/2019   Past Medical History:  Diagnosis Date   Arthritis    Diabetes mellitus without complication ([place])    Gout    Hyperlipidemia    Hypertension     Family History  Problem Relation Age of Onset   Cancer Father        bladder    Past Surgical History:  Procedure Laterality Date   APPENDECTOMY     CHOLECYSTECTOMY     KNEE SURGERY     TONSILLECTOMY     TOTAL HIP ARTHROPLASTY Left 06/27/2019   Procedure: LEFT TOTAL HIP ARTHROPLASTY ANTERIOR APPROACH;  Surgeon: Mcarthur Rossetti, MD;  Location: [facility];  Service: Orthopedics;  Laterality: Left;   Social History   Occupational History   Not on file  Tobacco Use   Smoking status: Never   Smokeless tobacco: Never  Substance and Sexual Activity   Alcohol use: Not Currently   Drug use: Never   Sexual activity: Yes    Birth control/protection: None

## 2022-01-16 NOTE — Progress Notes (Addendum)
Anesthesia Review: ? ?PCP: [facility] 12/01/21- Dr Molli Hazard Messsier  ?Cardiologist : DR Smith Robert  ?Ezra Sites , PA- [facility] 02/15/21 in [place]  ?[facility] with DR Smith Robert- 01/25/21.  ?Cardioversion 01/25/21  ?Chest x-ray : 11/03/21-1View  ?EKG : 11/08/21  ?Echo :01/03/21  ?Monitor- 12/27/20  ?Stress test: ?Cardiac Cath :  ?Activity level: can do a flight of stairs without difficulty  ?Sleep Study/ CPAP : none  ?Fasting Blood Sugar :      / Checks Blood Sugar -- times a day:   ?Blood Thinner/ Instructions /Last Dose: ?ASA / Instructions/ Last Dose :   ?81 mg Aspirin  ?Eliquis- To stop 3 days prior procedure per pt  ?12/01/21- [id number]  ?PT states used to be considered prediabetes.  Has not been on med in a long while.  Was taken off of med per MD  ?PT lives in [place], [place] and also has a home in [place]  ?PT reports here is scheduled her for knee surgery 03/2022 with Dr Magnus Ivan.  ?No covid test- ambulatory surgery  ?

## 2022-01-16 NOTE — Progress Notes (Addendum)
DUE TO COVID-19 ONLY ONE VISITOR IS ALLOWED TO COME WITH YOU AND STAY IN THE WAITING ROOM ONLY DURING PRE OP AND PROCEDURE DAY OF SURGERY. THE 1 VISITOR  MAY VISIT WITH YOU AFTER SURGERY IN YOUR PRIVATE ROOM DURING VISITING HOURS ONLY! ? ? ?  ?            Parker Daniel ? 01/16/2022 ? ? Your procedure is scheduled on:  ?         01/24/2022.  ? Report to [facility] Main  Entrance ? ? Report to admitting at  0715 AM ?  ? ? Call this number if you have problems the morning of surgery [phone]  ? ? Remember: Do not eat food , candy gum or mints :After Midnight. You may have clear liquids from midnight until __ ?0630am  ? ? ?CLEAR LIQUID DIET ? ? ?Foods Allowed                                                                      ? ?Coffee and tea, regular and decaf        no milk, cream or creamer                       ?Plain Jell-O any favor except red or purple                                            ?Fruit ices (not with fruit pulp)     no red                                  ?Iced Popsicles no red                                      ?Carbonated beverages, regular and diet                                    ?White cranberry, white grape and apple juices  ?Sports drinks like Gatorade ?Lightly seasoned clear broth or consume(fat free) ( vegetable, chicken or beef  ?Sugar ? ? ?_____________________________________________________________________ ?  ? BRUSH YOUR TEETH MORNING OF SURGERY AND RINSE YOUR MOUTH OUT, NO CHEWING GUM CANDY OR MINTS. ?  ? ? Take these medicines the morning of surgery with A SIP OF WATER:  allopurinol, multaq, xtampza  ? ?DO NOT TAKE ANY DIABETIC MEDICATIONS DAY OF YOUR SURGERY ?                  ?            You may not have any metal on your body including hair pins and  ?            piercings  Do not wear jewelry, make-up, lotions, powders or perfumes, deodorant ?  Do not wear nail polish on your fingernails.  Do not shave  48 hours prior to surgery.  ?            Men may  shave face and neck. ? ? Do not bring valuables to the hospital. [facility] NOT ?            RESPONSIBLE   FOR VALUABLES. ? Contacts, dentures or bridgework may not be worn into surgery. ? Leave suitcase in the car. After surgery it may be brought to your room. ? ?  ? Patients discharged the day of surgery will not be allowed to drive home. IF YOU ARE HAVING SURGERY AND GOING HOME THE SAME DAY, YOU MUST HAVE AN ADULT TO DRIVE YOU HOME AND BE WITH YOU FOR 24 HOURS. YOU MAY GO HOME BY TAXI OR UBER OR ORTHERWISE, BUT AN ADULT MUST ACCOMPANY YOU HOME AND STAY WITH YOU FOR 24 HOURS. ? Name and phone number of your driver: ? Special Instructions: N/A ? ?            Please read over the following fact sheets you were given: ?_____________________________________________________________________ ? ?[facility] - Preparing for Surgery ?Before surgery, you can play an important role.  Because skin is not sterile, your skin needs to be as free of germs as possible.  You can reduce the number of germs on your skin by washing with CHG (chlorahexidine gluconate) soap before surgery.  CHG is an antiseptic cleaner which kills germs and bonds with the skin to continue killing germs even after washing. ?Please DO NOT use if you have an allergy to CHG or antibacterial soaps.  If your skin becomes reddened/irritated stop using the CHG and inform your nurse when you arrive at Short Stay. ?Do not shave (including legs and underarms) for at least 48 hours prior to the first CHG shower.  You may shave your face/neck. ?Please follow these instructions carefully: ? 1.  Shower with CHG Soap the night before surgery and the  morning of Surgery. ? 2.  If you choose to wash your hair, wash your hair first as usual with your  normal  shampoo. ? 3.  After you shampoo, rinse your hair and body thoroughly to remove the  shampoo.                           4.  Use CHG as you would any other liquid soap.  You can apply chg directly  to the skin and  wash  ?                     Gently with a scrungie or clean washcloth. ? 5.  Apply the CHG Soap to your body ONLY FROM THE NECK DOWN.   Do not use on face/ open      ?                     Wound or open sores. Avoid contact with eyes, ears mouth and genitals (private parts).  ?                     Production manager,  Genitals (private parts) with your normal soap. ?            6.  Wash thoroughly, paying special attention to the area where your surgery  will be performed. ? 7.  Thoroughly rinse your body  with warm water from the neck down. ? 8.  DO NOT shower/wash with your normal soap after using and rinsing off  the CHG Soap. ?               9.  Pat yourself dry with a clean towel. ?           10.  Wear clean pajamas. ?           11.  Place clean sheets on your bed the night of your first shower and do not  sleep with pets. ?Day of Surgery : ?Do not apply any lotions/deodorants the morning of surgery.  Please wear clean clothes to the hospital/surgery center. ? ?FAILURE TO FOLLOW THESE INSTRUCTIONS MAY RESULT IN THE CANCELLATION OF YOUR SURGERY ?PATIENT SIGNATURE_________________________________ ? ?NURSE SIGNATURE__________________________________ ? ?________________________________________________________________________  ? ?          ?

## 2022-01-18 ENCOUNTER — Encounter (HOSPITAL_COMMUNITY)
Admission: RE | Admit: 2022-01-18 | Discharge: 2022-01-18 | Disposition: A | Discharge status: Home / Self Care | Payer: Medicare HMO | Source: Ambulatory Visit | Attending: Urology | Admitting: Urology

## 2022-01-18 ENCOUNTER — Other Ambulatory Visit: Payer: Self-pay

## 2022-01-18 ENCOUNTER — Encounter (HOSPITAL_COMMUNITY): Payer: Self-pay

## 2022-01-18 VITALS — BP 148/87 | HR 72 | Temp 98.1°F | Resp 18 | Ht 70.0 in | Wt 261.1 lb

## 2022-01-18 DIAGNOSIS — Z01812 Encounter for preprocedural laboratory examination: Secondary | ICD-10-CM | POA: Diagnosis not present

## 2022-01-18 DIAGNOSIS — E119 Type 2 diabetes mellitus without complications: Secondary | ICD-10-CM | POA: Diagnosis not present

## 2022-01-18 DIAGNOSIS — Z7901 Long term (current) use of anticoagulants: Secondary | ICD-10-CM | POA: Insufficient documentation

## 2022-01-18 DIAGNOSIS — I5022 Chronic systolic (congestive) heart failure: Secondary | ICD-10-CM | POA: Insufficient documentation

## 2022-01-18 DIAGNOSIS — I110 Hypertensive heart disease with heart failure: Secondary | ICD-10-CM | POA: Insufficient documentation

## 2022-01-18 DIAGNOSIS — K219 Gastro-esophageal reflux disease without esophagitis: Secondary | ICD-10-CM | POA: Insufficient documentation

## 2022-01-18 DIAGNOSIS — N320 Bladder-neck obstruction: Secondary | ICD-10-CM | POA: Diagnosis not present

## 2022-01-18 DIAGNOSIS — Z01818 Encounter for other preprocedural examination: Secondary | ICD-10-CM

## 2022-01-18 DIAGNOSIS — N200 Calculus of kidney: Secondary | ICD-10-CM | POA: Diagnosis not present

## 2022-01-18 DIAGNOSIS — I480 Paroxysmal atrial fibrillation: Secondary | ICD-10-CM | POA: Insufficient documentation

## 2022-01-18 HISTORY — DX: Personal history of urinary calculi: Z87.442

## 2022-01-18 HISTORY — DX: Gastro-esophageal reflux disease without esophagitis: K21.9

## 2022-01-18 HISTORY — DX: Dyspnea, unspecified: R06.00

## 2022-01-18 HISTORY — DX: Pneumonia, unspecified organism: J18.9

## 2022-01-18 LAB — BASIC METABOLIC PANEL
Anion gap: 7 (ref 5–15)
BUN: 13 mg/dL (ref 8–23)
CO2: 26 mmol/L (ref 22–32)
Calcium: 9.1 mg/dL (ref 8.9–10.3)
Chloride: 105 mmol/L (ref 98–111)
Creatinine, Ser: 0.78 mg/dL (ref 0.61–1.24)
GFR, Estimated: >60 mL/min (ref >60)
Glucose, Bld: 98 mg/dL (ref 70–99)
Potassium: 3.8 mmol/L (ref 3.5–5.1)
Sodium: 138 mmol/L (ref 135–145)

## 2022-01-18 LAB — CBC
HCT: 41 % (ref 39.0–52.0)
Hemoglobin: 12.9 g/dL — ABNORMAL LOW (ref 13.0–17.0)
MCH: 29.8 pg (ref 26.0–34.0)
MCHC: 31.5 g/dL (ref 30.0–36.0)
MCV: 94.7 fL (ref 80.0–100.0)
Platelets: 222 10*3/uL (ref 150–400)
RBC: 4.33 MIL/uL (ref 4.22–5.81)
RDW: 14.4 % (ref 11.5–15.5)
WBC: 6.5 10*3/uL (ref 4.0–10.5)
nRBC: 0 % (ref 0.0–0.2)

## 2022-01-18 LAB — GLUCOSE, CAPILLARY: Glucose-Capillary: 97 mg/dL (ref 70–99)

## 2022-01-19 NOTE — Progress Notes (Signed)
Anesthesia Chart Review ? ? Case: [id number] Date/Time: 01/24/22 0915  ? Procedures:  ?    THULIUM LASER TURP (TRANSURETHRAL RESECTION OF PROSTATE) ?    CYSTOSCOPY/RETROGRADE/URETEROSCOPY/HOLMIUM LASER/STENT PLACEMENT (Right)  ? Anesthesia type: General  ? Pre-op diagnosis: BLADDER NECK CONTRACTURE, KIDNEY STONES  ? Location: [facility] / [facility]  ? Surgeons: Jerilee Field, MD  ? ?  ? ? ?DISCUSSION:65 y.o. never smoker with h/o HTN, GERD, DM II (A1C 6.3 diet controlled), paroxysmal atrial fibrillation (on Eliquis), chronic systolic heart failure, bladder neck contracture, kidney stones scheduled for above procedure 01/24/2022 with Dr. Jerilee Field.  ? ?Pt last seen by cardiology 02/15/2021. Rate controlled with Multaq, on Eliquis. HTN well controlled. CHF with no symptoms.  ? ?Pt advised to hold Eliquis three days prior to procedure.  ? ?Anticipate pt can proceed with planned procedure barring acute status change.   ?VS: BP (!) 148/87   Pulse 72   Temp 36.7 ?C (Oral)   Resp 18   Ht 5\' 10"  (1.778 m)   Wt 118.4 kg   SpO2 96%   BMI 37.47 kg/m?  ? ?PROVIDERS: ?System, Provider Not In ? ? , MD is Cardiologist  ?LABS: Labs reviewed: Acceptable for surgery. ?(all labs ordered are listed, but only abnormal results are displayed) ? ?Labs Reviewed  ?CBC - Abnormal; Notable for the following components:  ?    Result Value  ? Hemoglobin 12.9 (*)   ? All other components within normal limits  ?BASIC METABOLIC PANEL  ?GLUCOSE, CAPILLARY  ? ? ? ?IMAGES: ? ? ?EKG: ?11/08/2021 ?Rate 102 bpm  ?Sinus tachycardia  ? ?CV: ?Echo 01/03/2021 ?CONCLUSIONS:  ?1. Mildly dilated left ventricle. The left ventricular systolic function is  ?borderline. LV Ejection Fraction is approximately: 50 %.  ?2. Indeterminate diastolic dysfunction based on 01/05/2021 ASE guidelines.  ?3. There are no regional wall motion abnormalities.  ?4. Normal right ventricular size and systolic function. Normal right  ?ventricular systolic function.   ?5. Mildly dilated left atrium.  ?6. Mild mitral valve regurgitation ?Past Medical History:  ?Diagnosis Date  ? Arthritis   ? Dyspnea   ? with exertion  ? GERD (gastroesophageal reflux disease)   ? Gout   ? History of kidney stones   ? Hyperlipidemia   ? Hypertension   ? Pneumonia   ? 10/2021  ? ? ?Past Surgical History:  ?Procedure Laterality Date  ? APPENDECTOMY    ? CHOLECYSTECTOMY    ? KNEE SURGERY    ? TONSILLECTOMY    ? TOTAL HIP ARTHROPLASTY Left 06/27/2019  ? Procedure: LEFT TOTAL HIP ARTHROPLASTY ANTERIOR APPROACH;  Surgeon: 06/29/2019, MD;  Location: [facility];  Service: Orthopedics;  Laterality: Left;  ? ? ?MEDICATIONS: ? acetaminophen (TYLENOL) 500 MG tablet  ? allopurinol (ZYLOPRIM) 300 MG tablet  ? Ascorbic Acid (VITAMIN C) 1000 MG tablet  ? cholecalciferol (VITAMIN D3) 25 MCG (1000 UNIT) tablet  ? doxycycline (VIBRAMYCIN) 100 MG capsule  ? ELIQUIS 5 MG TABS tablet  ? gabapentin (NEURONTIN) 300 MG capsule  ? ibuprofen (ADVIL) 200 MG tablet  ? metoprolol succinate (TOPROL-XL) 25 MG 24 hr tablet  ? MULTAQ 400 MG tablet  ? Multiple Vitamins-Minerals (MULTIVITAMIN ADULT) CHEW  ? oxyCODONE (ROXICODONE) 15 MG immediate release tablet  ? pravastatin (PRAVACHOL) 40 MG tablet  ? senna (SENOKOT) 8.6 MG tablet  ? tamsulosin (FLOMAX) 0.4 MG CAPS capsule  ? testosterone cypionate (DEPOTESTOSTERONE CYPIONATE) 200 MG/ML injection  ? traZODone (DESYREL) 50 MG tablet  ?  vitamin B-12 (CYANOCOBALAMIN) 1000 MCG tablet  ? XTAMPZA ER 18 MG C12A  ? ?No current facility-administered medications for this encounter.  ? ? ? ?Jodell Cipro Ward, PA-C ?[facility] ?[phone] ? ? ? ? ? ?

## 2022-01-23 NOTE — H&P (Signed)
Office Visit Report     01/16/2022   --------------------------------------------------------------------------------   Parker Daniel  MRN: [id number]  DOB: [birth year], 66 year old Male  SSN:    PRIMARY CARE:    REFERRING:  Georgette Dover, MD  PROVIDER:  Festus Aloe, M.D.  TREATING:  Daine Gravel, NP  LOCATION:  [facility] [phone]     --------------------------------------------------------------------------------   CC/HPI:   F/u -   1) low T - symptoms since 2010. His symptoms have not gotten worse over the last year. He does become fatigued easily. He has gained weight. His sex drive has decreased. He does have problems with erections.   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 Low T since 2010. On injections. Aug 2019 PSA 0.4, T 708, Hct 37. He was doing injections, but stopped. Restarted Jan 2021. 200 mg IM about q 2 weeks. T was 450 at 2 weeks, so we put him at 1 ml q 3 weeks. He was due for an injection and T was 87 10/21. He is due for another injection 01/22 and t was 162. He ran out of T and hasn't restarted in several months. His Dec 2022 T was 63, PSA 0.105, Hct 43.4.   He has not been able to start testosterone. For some reason the pharmacy gave him prefilled syringes and nurses were not comfortable using these for injection.    2) ED using sildenafil 50 mg -- [facility].    3) kidney stones - CT 2014 with bilateral stones 2-4 mm -- [facility]. KUB 04/21 with a 6 mm and 9 mm right stones. Stable back to CT Mar 2020. He's had some left inguinal pain. KUB 01/22, stable with a 6 mm and 9 mm Right stones. CT done 12/22 for MH which again shows 7 mm RLP stone and a 10 mm RMP stone that looks in an infundibulum off the renal pelvis this could be causing obstruction and parenchymal atrophy of the kidney this channel drains. No significant left stones.    4) MH eval in 2009 and 2011. UA 12/22 with > 60 rbc. CT Dec 2022 benign. Cysto 02/23 benign  with [place].    5) BPH - since 2016. Retention in 2019 with constipation s/p GL PVP. Passed VT. Was on tamsulosin -- [facility]. He has more bothersome LUTS. AUASS = 28. Tams rx'd Nov 2022. Helping some. Cysto 02/23 with a [place] - scope just passed into bladder.    Today, seen for the above. He is a complex patient and has a lot going on. Seen today to do discuss his testosterone, kidney stones as well as BPH. Also cystoscopy for microscopic hematuria.   He is on Eliquis for a fib.    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   01/16/2022:-66year-old male who presents for preoperative appointment prior to undergoing vaporization of the prostate, right-sided ureteroscopy, with holmium laser lithotripsy and stent placement. He is currently on Eliquis and understands to stop this medication 3 days prior to surgery. He has been cleared by his cardiologist. He currently takes tamsulosin 0.4 mg/day. He denies any changes to his voiding habits. He denies changes to his health history, he denies chest pain. He endorses shortness of breath but this is no worse than usual and is chronic for him. He denies fevers and chills.     ALLERGIES: Contrast Dye - **suspected; pt has had contrast dye since reaction took place**    MEDICATIONS: Allopurinol 300  mg tablet  Tamsulosin Hcl 0.4 mg capsule 1 capsule PO Q HS  Eliquis  Gabapentin 300 mg capsule  Multaq  Oxycodone Hcl 15 mg tablet  Oxycodone Hcl Er 20 mg tablet,oral only,extended release 12 hr  Pravastatin Sodium 40 mg tablet 1 ml IM q3wk  Sildenafil Citrate 100 mg tablet 1 tablet PO PRN  Testosterone Cypionate 200 mg/ml vial 1 ml IM Q3WK 5 or 10 mL vial OK with needles and supplies (no prefilled syringe) thank you!     GU PSH: Cystoscopy - 12/15/2021       PSH Notes: testicle removed  kidney stones removed   NON-GU PSH: Appendectomy (laparoscopic) Cholecystectomy (laparoscopic) Hip Replacement     GU PMH: Primary hypogonadism - 01/13/2022, -  12/23/2021, - 12/15/2021, - 12/09/2021, T was sent and consider dose increase from injection every 3 weeks to injection every 2 weeks - will let him know and set up a nurse visit. , - 12/13/2020, - 11/19/2020, - 10/22/2020, - 09/28/2020, Restart T - labs sent. , - 09/08/2020, labs sent today prior to injection , - 03/10/2020, Discussed nature r/b of clomiphene and T replacement (top, po, nasal, inj, testopel) including CV risk among others. , - 2021 Bladder-neck stenosis/contracture - 12/15/2021 Microscopic hematuria - 12/15/2021, - 11/28/2021, Check CT , - 10/27/2021 Renal calculus - 12/15/2021, CT , - 10/27/2021, Stable , - 12/13/2020, discussed surv, URS and ESWL. He had Eswl before but had what sound like an obs fragment, URS and a stent. He also had PCNL "through the back"/ He will cont surveillanc e, - 03/10/2020, - 2021 Weak Urinary Stream - 12/15/2021, - 10/27/2021 BPH w/LUTS, disc nature r/b of alpha blockers and he'll start tamsulosin - 10/27/2021 BPH w/o LUTS - 2021    NON-GU PMH: Arthritis Atrial Fibrillation GERD Gout Hypercholesterolemia Hypertension    FAMILY HISTORY: Bladder Cancer - Father   SOCIAL HISTORY: Marital Status: Married Preferred Language: English; Race: White Current Smoking Status: Patient has never smoked.   Tobacco Use Assessment Completed: Used Tobacco in last 30 days? Drinks 1 caffeinated drink per day.     Notes: 1 daughter   REVIEW OF SYSTEMS:    GU Review Male:   Patient reports frequent urination, hard to postpone urination, and get up at night to urinate. Patient denies burning/ pain with urination, leakage of urine, stream starts and stops, trouble starting your stream, have to strain to urinate , erection problems, and penile pain.  Gastrointestinal (Upper):   Patient denies nausea, vomiting, and indigestion/ heartburn.  Gastrointestinal (Lower):   Patient denies diarrhea and constipation.  Constitutional:   Patient denies fatigue, weight loss, fever, and night  sweats.  Cardiovascular:   Patient denies leg swelling and chest pains.  Respiratory:   Patient reports shortness of breath. Patient denies cough.  Musculoskeletal:   Patient denies back pain and joint pain.  Neurological:   Patient denies headaches and dizziness.  Psychologic:   Patient denies depression and anxiety.   VITAL SIGNS:      01/16/2022 09:01 AM  Weight 255 lb / 115.67 kg  Height 70 in / 177.8 cm  BP 132/86 mmHg  Pulse 74 /min  Temperature 99.1 F / 37.2 C  BMI 36.6 kg/m   MULTI-SYSTEM PHYSICAL EXAMINATION:    Constitutional: Well-nourished. No physical deformities. Normally developed. Good grooming.  Neck: Neck symmetrical, not swollen. Normal tracheal position.  Respiratory: Normal breath sounds. No labored breathing, no use of accessory muscles.   Cardiovascular: Regular rate and rhythm. No  murmur, no gallop. Normal temperature, normal extremity pulses, no swelling, no varicosities.   Lymphatic: No enlargement of neck, axillae, groin.  Skin: No paleness, no jaundice, no cyanosis. No lesion, no ulcer, no rash.  Neurologic / Psychiatric: Oriented to time, oriented to place, oriented to person. No depression, no anxiety, no agitation.  Gastrointestinal: No mass, no tenderness, no rigidity, non obese abdomen.  Musculoskeletal: Normal gait and station of head and neck.     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 He understands to notify our office with any changes to his health history. He also understands to notify our office if he develops fevers, chills, chest pain or shortness of breath prior to his surgery.        Next Appointment:      Next Appointment: 01/24/2022 09:30 AM    Appointment Type: Surgery     Location: [facility] [phone]    Provider: Festus Aloe, M.D.    Reason for Visit: [facility]/OP THULIUM LASER VAP, CYTO, RT RPF, RT URS , LL, STENT THUL REP NOTIFIED      * Signed by Daine Gravel, NP on 01/16/22 at 1:08 PM (EST)*      The information contained in this medical record document is considered private and confidential patient information. This information can only be used for the medical diagnosis and/or medical services that are being provided by the patient's selected caregivers. This information can only be distributed outside of the patient's care if the patient agrees and signs waivers of authorization for this information to be sent to an outside source or route.

## 2022-01-24 ENCOUNTER — Ambulatory Visit (HOSPITAL_COMMUNITY): Payer: Medicare HMO

## 2022-01-24 ENCOUNTER — Ambulatory Visit (HOSPITAL_COMMUNITY): Payer: Medicare HMO | Admitting: Physician Assistant

## 2022-01-24 ENCOUNTER — Encounter (HOSPITAL_COMMUNITY): Payer: Self-pay | Admitting: Urology

## 2022-01-24 ENCOUNTER — Encounter (HOSPITAL_COMMUNITY)
Admission: RE | Disposition: A | Discharge status: Home / Self Care | Payer: Self-pay | Source: Ambulatory Visit | Attending: Urology

## 2022-01-24 ENCOUNTER — Ambulatory Visit (HOSPITAL_COMMUNITY)
Admission: RE | Admit: 2022-01-24 | Discharge: 2022-01-24 | Disposition: A | Discharge status: Home / Self Care | Payer: Medicare HMO | Source: Ambulatory Visit | Attending: Urology | Admitting: Urology

## 2022-01-24 ENCOUNTER — Ambulatory Visit (HOSPITAL_BASED_OUTPATIENT_CLINIC_OR_DEPARTMENT_OTHER): Payer: Medicare HMO | Admitting: Anesthesiology

## 2022-01-24 DIAGNOSIS — I10 Essential (primary) hypertension: Secondary | ICD-10-CM | POA: Insufficient documentation

## 2022-01-24 DIAGNOSIS — E669 Obesity, unspecified: Secondary | ICD-10-CM | POA: Diagnosis not present

## 2022-01-24 DIAGNOSIS — N320 Bladder-neck obstruction: Secondary | ICD-10-CM

## 2022-01-24 DIAGNOSIS — R310 Gross hematuria: Secondary | ICD-10-CM | POA: Insufficient documentation

## 2022-01-24 DIAGNOSIS — R350 Frequency of micturition: Secondary | ICD-10-CM | POA: Diagnosis not present

## 2022-01-24 DIAGNOSIS — N200 Calculus of kidney: Secondary | ICD-10-CM | POA: Diagnosis not present

## 2022-01-24 DIAGNOSIS — Z6837 Body mass index (BMI) 37.0-37.9, adult: Secondary | ICD-10-CM | POA: Insufficient documentation

## 2022-01-24 DIAGNOSIS — R3912 Poor urinary stream: Secondary | ICD-10-CM | POA: Diagnosis not present

## 2022-01-24 DIAGNOSIS — N401 Enlarged prostate with lower urinary tract symptoms: Secondary | ICD-10-CM | POA: Insufficient documentation

## 2022-01-24 DIAGNOSIS — K219 Gastro-esophageal reflux disease without esophagitis: Secondary | ICD-10-CM | POA: Insufficient documentation

## 2022-01-24 DIAGNOSIS — N138 Other obstructive and reflux uropathy: Secondary | ICD-10-CM

## 2022-01-24 SURGERY — THULIUM LASER TURP (TRANSURETHRAL RESECTION OF PROSTATE)
Anesthesia: General | Laterality: Right

## 2022-01-24 MED ORDER — LIDOCAINE 2% (20 MG/ML) 5 ML SYRINGE
INTRAMUSCULAR | Status: DC | PRN
  Administered 2022-01-24: 100 mg via INTRAVENOUS

## 2022-01-24 MED ORDER — OXYCODONE HCL 5 MG PO TABS: 5.0000 mg | ORAL_TABLET | Freq: Once | ORAL | Status: AC | PRN

## 2022-01-24 MED ORDER — NITROFURANTOIN MONOHYD MACRO 100 MG PO CAPS: 100.0000 mg | ORAL_CAPSULE | Freq: Every day | ORAL | 0 refills | Status: DC

## 2022-01-24 MED ORDER — CHLORHEXIDINE GLUCONATE 0.12 % MT SOLN
15.0000 mL | Freq: Once | OROMUCOSAL | Status: AC
  Administered 2022-01-24: 15 mL via OROMUCOSAL

## 2022-01-24 MED ORDER — FENTANYL CITRATE (PF) 100 MCG/2ML IJ SOLN
INTRAMUSCULAR | Status: DC | PRN
  Administered 2022-01-24: 25 ug via INTRAVENOUS
  Administered 2022-01-24: 50 ug via INTRAVENOUS
  Administered 2022-01-24: 25 ug via INTRAVENOUS
  Administered 2022-01-24: 50 ug via INTRAVENOUS

## 2022-01-24 MED ORDER — HYDROMORPHONE HCL 1 MG/ML IJ SOLN
0.2500 mg | INTRAMUSCULAR | Status: DC | PRN
  Administered 2022-01-24 (×2): 0.25 mg via INTRAVENOUS

## 2022-01-24 MED ORDER — ONDANSETRON HCL 4 MG/2ML IJ SOLN: 4.0000 mg | Freq: Once | INTRAMUSCULAR | Status: DC | PRN

## 2022-01-24 MED ORDER — DEXAMETHASONE SODIUM PHOSPHATE 10 MG/ML IJ SOLN
INTRAMUSCULAR | Status: AC
  Filled 2022-01-24: qty 1

## 2022-01-24 MED ORDER — EPHEDRINE 5 MG/ML INJ
INTRAVENOUS | Status: AC
  Filled 2022-01-24: qty 5

## 2022-01-24 MED ORDER — HYDROMORPHONE HCL 1 MG/ML IJ SOLN
INTRAMUSCULAR | Status: AC
  Administered 2022-01-24: 0.5 mg via INTRAVENOUS
  Filled 2022-01-24: qty 1

## 2022-01-24 MED ORDER — FENTANYL CITRATE (PF) 100 MCG/2ML IJ SOLN
INTRAMUSCULAR | Status: AC
  Filled 2022-01-24: qty 2

## 2022-01-24 MED ORDER — ONDANSETRON HCL 4 MG/2ML IJ SOLN
INTRAMUSCULAR | Status: DC | PRN
Start: 2022-01-24 — End: 2022-01-24
  Administered 2022-01-24: 4 mg via INTRAVENOUS

## 2022-01-24 MED ORDER — DEXAMETHASONE SODIUM PHOSPHATE 10 MG/ML IJ SOLN
INTRAMUSCULAR | Status: DC | PRN
Start: 2022-01-24 — End: 2022-01-24
  Administered 2022-01-24: 10 mg via INTRAVENOUS

## 2022-01-24 MED ORDER — PHENYLEPHRINE 40 MCG/ML (10ML) SYRINGE FOR IV PUSH (FOR BLOOD PRESSURE SUPPORT)
PREFILLED_SYRINGE | INTRAVENOUS | Status: AC
  Filled 2022-01-24: qty 10

## 2022-01-24 MED ORDER — PHENYLEPHRINE 40 MCG/ML (10ML) SYRINGE FOR IV PUSH (FOR BLOOD PRESSURE SUPPORT)
PREFILLED_SYRINGE | INTRAVENOUS | Status: DC | PRN
  Administered 2022-01-24: 80 ug via INTRAVENOUS

## 2022-01-24 MED ORDER — IOHEXOL 300 MG/ML  SOLN
INTRAMUSCULAR | Status: DC | PRN
Start: 2022-01-24 — End: 2022-01-24
  Administered 2022-01-24: 6 mL

## 2022-01-24 MED ORDER — ELIQUIS 5 MG PO TABS: 5.0000 mg | ORAL_TABLET | Freq: Two times a day (BID) | ORAL | Status: DC

## 2022-01-24 MED ORDER — PROPOFOL 10 MG/ML IV BOLUS
INTRAVENOUS | Status: DC | PRN
  Administered 2022-01-24: 180 mg via INTRAVENOUS
  Administered 2022-01-24: 20 mg via INTRAVENOUS

## 2022-01-24 MED ORDER — ONDANSETRON HCL 4 MG/2ML IJ SOLN
INTRAMUSCULAR | Status: AC
  Filled 2022-01-24: qty 2

## 2022-01-24 MED ORDER — LACTATED RINGERS IV SOLN: INTRAVENOUS | Status: DC

## 2022-01-24 MED ORDER — 0.9 % SODIUM CHLORIDE (POUR BTL) OPTIME
TOPICAL | Status: DC | PRN
  Administered 2022-01-24: 1000 mL

## 2022-01-24 MED ORDER — ORAL CARE MOUTH RINSE: 15.0000 mL | Freq: Once | OROMUCOSAL | Status: AC

## 2022-01-24 MED ORDER — KETOROLAC TROMETHAMINE 30 MG/ML IJ SOLN
INTRAMUSCULAR | Status: DC | PRN
  Administered 2022-01-24: 30 mg via INTRAVENOUS

## 2022-01-24 MED ORDER — SODIUM CHLORIDE 0.9 % IR SOLN
Status: DC | PRN
  Administered 2022-01-24: 1000 mL

## 2022-01-24 MED ORDER — OXYCODONE HCL 5 MG PO TABS
ORAL_TABLET | ORAL | Status: AC
  Administered 2022-01-24: 5 mg via ORAL
  Filled 2022-01-24: qty 1

## 2022-01-24 MED ORDER — PROPOFOL 500 MG/50ML IV EMUL
INTRAVENOUS | Status: AC
  Filled 2022-01-24: qty 50

## 2022-01-24 MED ORDER — CEFAZOLIN SODIUM-DEXTROSE 2-4 GM/100ML-% IV SOLN
2.0000 g | Freq: Once | INTRAVENOUS | Status: AC
  Administered 2022-01-24: 2 g via INTRAVENOUS
  Filled 2022-01-24: qty 100

## 2022-01-24 MED ORDER — EPHEDRINE SULFATE-NACL 50-0.9 MG/10ML-% IV SOSY
PREFILLED_SYRINGE | INTRAVENOUS | Status: DC | PRN
  Administered 2022-01-24: 10 mg via INTRAVENOUS
  Administered 2022-01-24: 5 mg via INTRAVENOUS
  Administered 2022-01-24: 10 mg via INTRAVENOUS

## 2022-01-24 MED ORDER — MIDAZOLAM HCL 2 MG/2ML IJ SOLN
INTRAMUSCULAR | Status: AC
  Filled 2022-01-24: qty 2

## 2022-01-24 MED ORDER — LIDOCAINE HCL (PF) 2 % IJ SOLN
INTRAMUSCULAR | Status: AC
  Filled 2022-01-24: qty 5

## 2022-01-24 MED ORDER — SODIUM CHLORIDE 0.9 % IR SOLN
Status: DC | PRN
  Administered 2022-01-24: 9000 mL

## 2022-01-24 MED ORDER — KETOROLAC TROMETHAMINE 30 MG/ML IJ SOLN
INTRAMUSCULAR | Status: AC
  Filled 2022-01-24: qty 1

## 2022-01-24 MED ORDER — MIDAZOLAM HCL 5 MG/5ML IJ SOLN
INTRAMUSCULAR | Status: DC | PRN
  Administered 2022-01-24: 2 mg via INTRAVENOUS

## 2022-01-24 MED ORDER — OXYCODONE HCL 5 MG/5ML PO SOLN: 5.0000 mg | Freq: Once | ORAL | Status: AC | PRN

## 2022-01-24 SURGICAL SUPPLY — 36 items
BAG DRN RND TRDRP ANRFLXCHMBR (UROLOGICAL SUPPLIES) ×2
BAG URINE DRAIN 2000ML AR STRL (UROLOGICAL SUPPLIES) ×3 IMPLANT
BAG URO CATCHER STRL LF (MISCELLANEOUS) ×3 IMPLANT
BASKET ZERO TIP NITINOL 2.4FR (BASKET) ×1 IMPLANT
BSKT STON RTRVL ZERO TP 2.4FR (BASKET) ×2
CATH FOLEY 3WAY 30CC 22FR (CATHETERS) IMPLANT
CATH TIEMANN FOLEY 18FR 5CC (CATHETERS) ×1 IMPLANT
CATH URET 5FR 28IN CONE TIP (BALLOONS)
CATH URET 5FR 70CM CONE TIP (BALLOONS) IMPLANT
CATH URETL OPEN END 6FR 70 (CATHETERS) ×3 IMPLANT
CLOTH BEACON ORANGE TIMEOUT ST (SAFETY) ×3 IMPLANT
EXTRACTOR STONE NITINOL NGAGE (UROLOGICAL SUPPLIES) ×1 IMPLANT
GLOVE SURG ENC MOIS LTX SZ7.5 (GLOVE) ×3 IMPLANT
GLOVE SURG ENC MOIS LTX SZ8 (GLOVE) IMPLANT
GOWN STRL REUS W/ TWL XL LVL3 (GOWN DISPOSABLE) ×2 IMPLANT
GOWN STRL REUS W/TWL XL LVL3 (GOWN DISPOSABLE) ×9
GUIDEWIRE STR DUAL SENSOR (WIRE) ×3 IMPLANT
GUIDEWIRE ZIPWRE .038 STRAIGHT (WIRE) ×1 IMPLANT
HOLDER FOLEY CATH W/STRAP (MISCELLANEOUS) ×3 IMPLANT
IV NS IRRIG 3000ML ARTHROMATIC (IV SOLUTION) ×3 IMPLANT
KIT TURNOVER KIT A (KITS) IMPLANT
LASER FIB FLEXIVA PULSE ID 365 (Laser) ×2 IMPLANT
LASER REVOLIX HI ENERGY 1000 (Laser) ×1 IMPLANT
LASER REVOLIX PROCEDURE (MISCELLANEOUS) ×3 IMPLANT
LOOP CUT BIPOLAR 24F LRG (ELECTROSURGICAL) IMPLANT
MANIFOLD NEPTUNE II (INSTRUMENTS) ×3 IMPLANT
PACK CYSTO (CUSTOM PROCEDURE TRAY) ×3 IMPLANT
SHEATH NAVIGATOR HD 11/13X28 (SHEATH) IMPLANT
SHEATH NAVIGATOR HD 11/13X36 (SHEATH) ×1 IMPLANT
SHEATH NAVIGATOR HD 12/14X46 (SHEATH) ×1 IMPLANT
STENT URET 6FRX26 CONTOUR (STENTS) ×1 IMPLANT
SYR 30ML LL (SYRINGE) IMPLANT
TRACTIP FLEXIVA PULS ID 200XHI (Laser) IMPLANT
TRACTIP FLEXIVA PULSE ID 200 (Laser)
TUBING CONNECTING 10 (TUBING) ×3 IMPLANT
TUBING UROLOGY SET (TUBING) ×3 IMPLANT

## 2022-01-24 NOTE — Interval H&P Note (Signed)
History and Physical Interval Note: ? ?01/24/2022 ?8:59 AM ? ?Parker Daniel  has presented today for surgery, with the diagnosis of BLADDER NECK CONTRACTURE, KIDNEY STONES.  The various methods of treatment have been discussed with the patient and family. After consideration of risks, benefits and other options for treatment, the patient has consented to  Procedure(s): ?THULIUM LASER TURP (TRANSURETHRAL RESECTION OF PROSTATE) (N/A) ?CYSTOSCOPY/RETROGRADE/URETEROSCOPY/HOLMIUM LASER/STENT PLACEMENT (Right) as a surgical intervention.  The patient's history has been reviewed, patient examined, no change in status, stable for surgery.  I have reviewed the patient's chart and labs.  Questions were answered to the patient's satisfaction.  He has no dysuria or fever. He's had some "red" urine on and off. He's had a foley and a stent before (removed in office) - [place] - discussed possibility post-op. Disc RGE, sx and incon again with laser prostate among other risks. He elects to proceed.  ? ? ?Jerilee Field ? ? ?

## 2022-01-24 NOTE — Discharge Instructions (Addendum)
Removal of the Foley catheter/stent-remove the Foley catheter as instructed on Friday morning, March 17.  The stent will slide out with the catheter. ? ? ? ? ?

## 2022-01-24 NOTE — Op Note (Addendum)
Preoperative diagnosis: BPH, weak stream, frequency, bladder neck contracture, gross hematuria, right renal stones ? ?Postoperative diagnosis: Same ? ?Procedure:  ?1 cystoscopy,  ?2 thulium laser vaporization of the prostate and incision of bladder neck,  ?3 right retrograde pyelogram,  ?4 right ureteroscopy, laser lithotripsy, stone basket extraction,  ?5 right ureteral stent placement ? ?Surgeon: Mena Goes ? ?Anesthesia: General ? ?Indication for procedure: [name] is a 66 year old male with significant lower urinary tract symptoms and episodes of gross hematuria.  He had prior greenlight photo vaporization of the prostate and on cystoscopy had some residual lateral lobe tissue and a moderate bladder neck contracture.  He also had several stones in the right kidney, 2 larger and one that might of been obstructing an infundibulum.  Patient also noted preoperatively is having a lot of pain with his back and his knee.  He is taking pain medication for these issues.  We did discuss again typical flow symptoms improved, sometimes the frequency and urgency can persist and be related to the bladder or neurologic causes. ? ?Findings: On exam under anesthesia the penis was circumcised without mass or lesion.  Scrotum appeared normal.  On digital rectal exam the prostate was smooth without hard area or nodule.  About 30 g. ? ?On cystoscopy the urethra was unremarkable, the prostatic urethra was obstructed by some residual lateral lobe tissue as well as a bladder neck contracture down to about 15 [place].  The bladder was a large capacity with moderate trabeculation.  There were no mucosal lesions.  No stone or foreign body in the bladder. ? ?Right retrograde pyelogram-this outlined a single ureter single collecting system unit.  There is no stricture.  There was a lower pole filling defect and the defect in the upper part of the renal pelvis consistent with the larger stones. ? ?Right ureteroscopy noted a normal ureter which  was tight proximally.  It would only accommodate the 11/13 access sheath and the single channel digital.  This limited our ability to work in the kidney and extract stone.  There were no mucosal lesions of the collecting system.  Large stone noted in the lower pole and more mobile in the renal pelvis.  Smaller stone noted in the lower calyx and in the upper pole posterior calyx.  All stones were dusted.  Apart from the small lower pole stone which was extracted.  A few other small fragments were extracted for analysis.   ? ?Description of procedure: After consent was obtained patient brought to the operating room.  After adequate anesthesia he is placed in lithotomy position and prepped and draped in the usual sterile fashion.  Timeout was performed from the patient and procedure.  Cystoscope was passed per urethra and the bladder carefully inspected with a 30 degree and 70 degree lens. ? ?The laser continuous-flow sheath was then passed in the 1000 ?m and firing laser fiber passed.  Has started with incisions at the bladder neck in line from the right ureteral orifice at about 7:00 and brought this down to the veru and made an similar incision at the left ureteral orifice down to the view at the 5 o'clock position.  This nicely opened up the bladder neck.  Then laterally from anterior to posterior proximal to distal some residual lateral lobe tissue was ablated.  This created a nice open channel.  Hemostasis was excellent at low pressure. ? ?We then turned our attention back to the cystoscope and an open-ended catheter was used to cannulate the  right ureteral orifice and retrograde injection of contrast was performed.  I then used a short access sheath to get 2 wires in place.  The dual channel digital was passed but would not pass through the proximal ureter.  Since we had the shorter medium access sheath at 36 cm 11/13 [place] I thought if we could get the longer sheath and we would get better access.  Therefore I  remove the 1113 and passed a 46 cm 12/14 but this sheath would not advance out of the distal ureter.  Therefore I swapped back to the shorter 11/13 access sheath and passed up the single channel digital which made it into the kidney without difficulty.  The largest stone was located in the upper part of the renal pelvis.  I suspect this is mobile and had been knocking around and causing some of his hematuria.  I flipped the stone up into the upper calyx.  The larger lower pole stone was grasped with a 0 tip basket and put into a midpole calyx for ease of fragmentation.  I then passed the 365 laser fiber and dusted the larger stones in the midpole and upper pole.  Small dark stone was noted in the lower pole and it was too difficult to encircle with the 0 tip so it was grasped with an engage.  This was removed intact for analysis.  Another stone was noted on fluoroscopy and this ended up being in a posterior upper calyx.  The laser fiber had been little down to the plastic.  It did not seem to be effective and it was swapped out for a new laser.  The smaller upper pole stone was dusted.  I then went back to the upper calyx fragments and treated those with the laser for several more minutes as well as the midpole fragments.on fluoroscopy there were no other obvious stones noted.  On ureteroscopy no significant fragments remained.  The access sheath was backed out on the ureteroscope the collecting system renal pelvis and ureter were inspected on the way out.  No injury was noted no significant stone fragment in the collecting system, no stone fragments in the ureter.  I then swapped out to the cystoscope and the wire was backloaded on the cystoscope.  A 6 x 26 cm stent was advanced.  The wire was removed with good coil seen in the renal pelvis and a good coil in the bladder.  Hemostasis again excellent from the laser vaporization.  I then passed an 70 [place] coud? catheter left that to gravity drainage.  The stent  string was taped to the Foley.  The patient was awakened taken to cover him in stable condition. ? ?Complications: None ? ?Blood loss: Minimal ? ?Specimens: Stone fragments to office lab ? ?Drains: 6 x 26 cm right ureteral stent and 18 French coud? catheter ? ?Disposition: Patient stable to PACU ? ?

## 2022-01-24 NOTE — Transfer of Care (Signed)
Immediate Anesthesia Transfer of Care Note ? ?Patient: WILLOW RECZEK ? ?Procedure(s) Performed: THULIUM LASER TURP (TRANSURETHRAL RESECTION OF PROSTATE) ?CYSTOSCOPY/RETROGRADE/URETEROSCOPY/HOLMIUM LASER/STENT PLACEMENT (Right) ? ?Patient Location: PACU ? ?Anesthesia Type:General ? ?Level of Consciousness: awake, drowsy and patient cooperative ? ?Airway & Oxygen Therapy: Patient Spontanous Breathing and Patient connected to face mask oxygen ? ?Post-op Assessment: Report given to RN and Post -op Vital signs reviewed and stable ? ?Post vital signs: Reviewed and stable ? ?Last Vitals:  ?Vitals Value Taken Time  ?BP 153/93 01/24/22 1155  ?Temp 37.2 ?C 01/24/22 1155  ?Pulse 87 01/24/22 1156  ?Resp 23 01/24/22 1156  ?SpO2 97 % 01/24/22 1156  ?Vitals shown include unvalidated device data. ? ?Last Pain:  ?Vitals:  ? 01/24/22 0743  ?TempSrc: Oral  ?PainSc: 0-No pain  ?   ? ?  ? ?Complications: No notable events documented. ?

## 2022-01-24 NOTE — Anesthesia Procedure Notes (Signed)
Procedure Name: LMA Insertion ?Date/Time: 01/24/2022 9:15 AM ?Performed by: Briant Sites, CRNA ?Pre-anesthesia Checklist: Patient identified, Emergency Drugs available, Suction available and Patient being monitored ?Patient Re-evaluated:Patient Re-evaluated prior to induction ?Oxygen Delivery Method: Circle system utilized ?Preoxygenation: Pre-oxygenation with 100% oxygen ?Induction Type: IV induction ?Ventilation: Mask ventilation without difficulty ?LMA: LMA inserted ?LMA Size: 5.0 ?Number of attempts: 1 ?Airway Equipment and Method: Bite block ?Placement Confirmation: positive ETCO2 ?Tube secured with: Tape ?Dental Injury: Teeth and Oropharynx as per pre-operative assessment  ? ? ? ? ?

## 2022-01-24 NOTE — Anesthesia Postprocedure Evaluation (Signed)
Anesthesia Post Note ? ?Patient: DAMILOLA LAPERRIERE ? ?Procedure(s) Performed: THULIUM LASER TURP (TRANSURETHRAL RESECTION OF PROSTATE) ?CYSTOSCOPY/RETROGRADE/URETEROSCOPY/HOLMIUM LASER/STENT PLACEMENT (Right) ? ?  ? ?Patient location during evaluation: PACU ?Anesthesia Type: General ?Level of consciousness: awake and alert and oriented ?Pain management: pain level controlled ?Vital Signs Assessment: post-procedure vital signs reviewed and stable ?Respiratory status: spontaneous breathing, nonlabored ventilation and respiratory function stable ?Cardiovascular status: blood pressure returned to baseline and stable ?Postop Assessment: no apparent nausea or vomiting ?Anesthetic complications: no ? ? ?No notable events documented. ? ?Last Vitals:  ?Vitals:  ? 01/24/22 1200 01/24/22 1215  ?BP: 130/78 (!) 157/87  ?Pulse: 87 83  ?Resp: 14 15  ?Temp:    ?SpO2: 96% 96%  ?  ?Last Pain:  ?Vitals:  ? 01/24/22 1225  ?TempSrc:   ?PainSc: 4   ? ? ?  ?  ?  ?  ?  ?  ? ?Colleena Kurtenbach A. ? ? ? ? ?

## 2022-01-24 NOTE — Interval H&P Note (Signed)
History and Physical Interval Note: ? ?01/24/2022 ?9:07 AM ? ?Parker Daniel  has presented today for surgery, with the diagnosis of BLADDER NECK CONTRACTURE, KIDNEY STONES. I also discussed he may need a pre-stent/staged procedure.  ? ? ?Jerilee Field ? ? ?

## 2022-01-24 NOTE — OR Nursing (Signed)
Stone taken by Dr. Eskridge ?

## 2022-01-24 NOTE — Anesthesia Preprocedure Evaluation (Signed)
Anesthesia Evaluation  ?Patient identified by MRN, date of birth, ID band ?Patient awake ? ? ? ?Reviewed: ?Allergy & Precautions, NPO status , Patient's Chart, lab work & pertinent test results, reviewed documented beta blocker date and time  ? ?Airway ?Mallampati: III ? ?TM Distance: >3 FB ?Neck ROM: Full ? ? ? Dental ?no notable dental hx. ?(+) Teeth Intact, Caps, Dental Advisory Given, Chipped,  ?  ?Pulmonary ?shortness of breath and with exertion, pneumonia, resolved,  ?  ?Pulmonary exam normal ?breath sounds clear to auscultation ? ? ? ? ? ? Cardiovascular ?hypertension, Pt. on medications ?Normal cardiovascular exam ?Rhythm:Regular Rate:Normal ? ? ?  ?Neuro/Psych ?negative neurological ROS ? negative psych ROS  ? GI/Hepatic ?Neg liver ROS, GERD  Medicated,  ?Endo/Other  ?Obesity ?Gout ? Renal/GU ?Right renal calculus  ? ?Bladder neck contracture ? ?  ?Musculoskeletal ? ?(+) Arthritis , Osteoarthritis,   ? Abdominal ?(+) + obese,   ?Peds ? Hematology ?negative hematology ROS ?(+)   ?Anesthesia Other Findings ? ? Reproductive/Obstetrics ? ?  ? ? ? ? ? ? ? ? ? ? ? ? ? ?  ?  ? ? ? ? ? ? ? ? ?Anesthesia Physical ?Anesthesia Plan ? ?ASA: 3 ? ?Anesthesia Plan: General  ? ?Post-op Pain Management:   ? ?Induction: Intravenous ? ?PONV Risk Score and Plan: 4 or greater and Treatment may vary due to age or medical condition, Midazolam, Ondansetron and Dexamethasone ? ?Airway Management Planned: LMA ? ?Additional Equipment: None ? ?Intra-op Plan:  ? ?Post-operative Plan: Extubation in OR ? ?Informed Consent: I have reviewed the patients History and Physical, chart, labs and discussed the procedure including the risks, benefits and alternatives for the proposed anesthesia with the patient or authorized representative who has indicated his/her understanding and acceptance.  ? ? ? ?Dental advisory given ? ?Plan Discussed with: CRNA and Anesthesiologist ? ?Anesthesia Plan Comments:    ? ? ? ? ? ? ?Anesthesia Quick Evaluation ? ?

## 2022-01-25 ENCOUNTER — Encounter (HOSPITAL_COMMUNITY): Payer: Self-pay | Admitting: Urology

## 2022-01-28 ENCOUNTER — Encounter (HOSPITAL_COMMUNITY): Payer: Self-pay | Admitting: Radiology

## 2022-01-28 ENCOUNTER — Emergency Department (HOSPITAL_COMMUNITY)
Admission: EM | Admit: 2022-01-28 | Discharge: 2022-01-28 | Disposition: A | Discharge status: Home / Self Care | Payer: Medicare HMO | Attending: Emergency Medicine | Admitting: Emergency Medicine

## 2022-01-28 ENCOUNTER — Other Ambulatory Visit: Payer: Self-pay

## 2022-01-28 ENCOUNTER — Emergency Department (HOSPITAL_COMMUNITY): Payer: Medicare HMO

## 2022-01-28 DIAGNOSIS — N132 Hydronephrosis with renal and ureteral calculous obstruction: Secondary | ICD-10-CM | POA: Insufficient documentation

## 2022-01-28 DIAGNOSIS — Z79899 Other long term (current) drug therapy: Secondary | ICD-10-CM | POA: Insufficient documentation

## 2022-01-28 DIAGNOSIS — R109 Unspecified abdominal pain: Secondary | ICD-10-CM

## 2022-01-28 DIAGNOSIS — N1330 Unspecified hydronephrosis: Secondary | ICD-10-CM

## 2022-01-28 DIAGNOSIS — Z7901 Long term (current) use of anticoagulants: Secondary | ICD-10-CM | POA: Diagnosis not present

## 2022-01-28 DIAGNOSIS — R1031 Right lower quadrant pain: Secondary | ICD-10-CM | POA: Diagnosis present

## 2022-01-28 DIAGNOSIS — N201 Calculus of ureter: Secondary | ICD-10-CM

## 2022-01-28 LAB — BASIC METABOLIC PANEL
Anion gap: 10 (ref 5–15)
BUN: 17 mg/dL (ref 8–23)
CO2: 22 mmol/L (ref 22–32)
Calcium: 8.6 mg/dL — ABNORMAL LOW (ref 8.9–10.3)
Chloride: 103 mmol/L (ref 98–111)
Creatinine, Ser: 1.23 mg/dL (ref 0.61–1.24)
GFR, Estimated: >60 mL/min (ref >60)
Glucose, Bld: 102 mg/dL — ABNORMAL HIGH (ref 70–99)
Potassium: 3.2 mmol/L — ABNORMAL LOW (ref 3.5–5.1)
Sodium: 135 mmol/L (ref 135–145)

## 2022-01-28 LAB — URINALYSIS, ROUTINE W REFLEX MICROSCOPIC
Bacteria, UA: NONE SEEN
Bilirubin Urine: NEGATIVE
Glucose, UA: NEGATIVE mg/dL
Ketones, ur: NEGATIVE mg/dL
Nitrite: NEGATIVE
Protein, ur: 100 mg/dL — AB
RBC / HPF: >50 RBC/hpf — ABNORMAL HIGH (ref 0–5)
Specific Gravity, Urine: 1.02 (ref 1.005–1.030)
WBC, UA: >50 WBC/hpf — ABNORMAL HIGH (ref 0–5)
pH: 5 (ref 5.0–8.0)

## 2022-01-28 LAB — CBC
HCT: 38.5 % — ABNORMAL LOW (ref 39.0–52.0)
Hemoglobin: 12.3 g/dL — ABNORMAL LOW (ref 13.0–17.0)
MCH: 29.6 pg (ref 26.0–34.0)
MCHC: 31.9 g/dL (ref 30.0–36.0)
MCV: 92.8 fL (ref 80.0–100.0)
Platelets: 195 10*3/uL (ref 150–400)
RBC: 4.15 MIL/uL — ABNORMAL LOW (ref 4.22–5.81)
RDW: 13.8 % (ref 11.5–15.5)
WBC: 9.5 10*3/uL (ref 4.0–10.5)
nRBC: 0 % (ref 0.0–0.2)

## 2022-01-28 MED ORDER — HYDROMORPHONE HCL 1 MG/ML IJ SOLN
1.0000 mg | Freq: Once | INTRAMUSCULAR | Status: AC
  Administered 2022-01-28: 1 mg via INTRAVENOUS
  Filled 2022-01-28: qty 1

## 2022-01-28 MED ORDER — LORAZEPAM 2 MG/ML IJ SOLN
1.0000 mg | Freq: Once | INTRAMUSCULAR | Status: AC
  Administered 2022-01-28: 1 mg via INTRAVENOUS
  Filled 2022-01-28: qty 1

## 2022-01-28 NOTE — ED Provider Notes (Signed)
?[facility]-EMERGENCY DEPT ?Provider Note ? ? ?CSN: [id number] ?Arrival date & time: 01/28/22  [id number] ? ?  ? ?History ? ?Chief Complaint  ?Patient presents with  ? Post-op Problem  ? Abdominal Pain  ? ? ?Parker MedinaDavid D Daniel is a 66 y.o. male. ? ?HPI ?Patient here for evaluation of decreased urinary output and right lower abdominal pain since removing a right ureteral stent, yesterday evening.  It had been placed, several days prior after kidney stone removal.  Also at that time he had laser vaporization of prostate and incision of bladder neck for urinary outflow problems. ?  ? ?Home Medications ?Prior to Admission medications   ?Medication Sig Start Date End Date Taking? Authorizing Provider  ?acetaminophen (TYLENOL) 500 MG tablet Take 1,000 mg by mouth every 8 (eight) hours as needed for moderate pain.    [provider]  ?allopurinol (ZYLOPRIM) 300 MG tablet Take 300 mg by mouth daily. [facility]    [facility]  ?Ascorbic Acid (VITAMIN C) 1000 MG tablet Take 1,000 mg by mouth daily.    [provider]  ?cholecalciferol (VITAMIN D3) 25 MCG (1000 UNIT) tablet Take 1,000 Units by mouth daily.    [provider]  ?ELIQUIS 5 MG TABS tablet Take 1 tablet (5 mg total) by mouth 2 (two) times daily. 01/26/22   Jerilee FieldEskridge, Matthew, MD  ?gabapentin (NEURONTIN) 300 MG capsule Take 600 mg by mouth 3 (three) times daily.    [provider]  ?ibuprofen (ADVIL) 200 MG tablet Take 400 mg by mouth every 8 (eight) hours as needed for moderate pain.    [provider]  ?metoprolol succinate (TOPROL-XL) 25 MG 24 hr tablet Take 25 mg by mouth daily. Pt takes in the pm 01/12/22   [provider]  ?MULTAQ 400 MG tablet Take 400 mg by mouth 2 (two) times daily. Pt takes at 6pm and 6 am 11/22/21   [provider]  ?Multiple Vitamins-Minerals (MULTIVITAMIN ADULT) CHEW Chew 1 each by mouth daily. Sugar Free    [provider]   ?nitrofurantoin, macrocrystal-monohydrate, (MACROBID) 100 MG capsule Take 1 capsule (100 mg total) by mouth at bedtime. 01/24/22   Jerilee FieldEskridge, Matthew, MD  ?oxyCODONE (ROXICODONE) 15 MG immediate release tablet Take 15 mg by mouth 2 (two) times daily as needed for pain. 12/02/21   [provider]  ?pravastatin (PRAVACHOL) 40 MG tablet Take 40 mg by mouth daily. Pt takes in the pm 02/20/19   [provider]  ?senna (SENOKOT) 8.6 MG tablet Take 2-3 tablets by mouth daily as needed for constipation.    [provider]  ?tamsulosin (FLOMAX) 0.4 MG CAPS capsule Take 0.4 mg by mouth daily. [facility]   [provider]  ?testosterone cypionate (DEPOTESTOSTERONE CYPIONATE) 200 MG/ML injection Inject 200 mg into the muscle every 21 ( twenty-one) days.    [provider]  ?traZODone (DESYREL) 50 MG tablet Take 50 mg by mouth at bedtime.    [provider]  ?vitamin B-12 (CYANOCOBALAMIN) 1000 MCG tablet Take 1,000 mcg by mouth daily.    [provider]  ?XTAMPZA ER 18 MG C12A Take 18 mg by mouth 2 (two) times daily. Pt takes at 6pm and 6 am 12/31/21   [provider]  ?   ? ?Allergies    ?Patient has no known allergies.   ? ?Review of Systems   ?Review of Systems ? ?Physical Exam ?  Updated Vital Signs ?BP (!) 155/92 (BP Location: Left Arm)   Pulse 65   Temp 98.2 ?F (36.8 ?C) (Oral)   Resp 18   SpO2 100%  ?Physical Exam ?Vitals and nursing note reviewed.  ?Constitutional:   ?   General: He is in acute distress (Uncomfortable).  ?   Appearance: He is well-developed. He is not ill-appearing.  ?HENT:  ?   Head: Normocephalic and atraumatic.  ?   Right Ear: External ear normal.  ?   Left Ear: External ear normal.  ?Eyes:  ?   Conjunctiva/sclera: Conjunctivae normal.  ?   Pupils: Pupils are equal, round, and reactive to light.  ?Neck:  ?   Trachea: Phonation normal.  ?Cardiovascular:  ?   Rate and Rhythm: Normal rate.  ?Pulmonary:  ?   Effort:  Pulmonary effort is normal.  ?Abdominal:  ?   General: There is no distension.  ?   Palpations: Abdomen is soft.  ?   Tenderness: There is abdominal tenderness (Right lower quadrant, mild). There is no guarding.  ?Musculoskeletal:     ?   General: Normal range of motion.  ?   Cervical back: Normal range of motion and neck supple.  ?Skin: ?   General: Skin is warm and dry.  ?Neurological:  ?   Mental Status: He is alert and oriented to person, place, and time.  ?   Cranial Nerves: No cranial nerve deficit.  ?   Sensory: No sensory deficit.  ?   Motor: No abnormal muscle tone.  ?   Coordination: Coordination normal.  ?Psychiatric:     ?   Mood and Affect: Mood normal.     ?   Behavior: Behavior normal.     ?   Thought Content: Thought content normal.     ?   Judgment: Judgment normal.  ? ? ?ED Results / Procedures / Treatments   ?Labs ?(all labs ordered are listed, but only abnormal results are displayed) ?Labs Reviewed  ?URINALYSIS, ROUTINE W REFLEX MICROSCOPIC - Abnormal; Notable for the following components:  ?    Result Value  ? APPearance HAZY (*)   ? Hgb urine dipstick LARGE (*)   ? Protein, ur 100 (*)   ? Leukocytes,Ua MODERATE (*)   ? RBC / HPF >50 (*)   ? WBC, UA >50 (*)   ? All other components within normal limits  ?BASIC METABOLIC PANEL - Abnormal; Notable for the following components:  ? Potassium 3.2 (*)   ? Glucose, Bld 102 (*)   ? Calcium 8.6 (*)   ? All other components within normal limits  ?CBC - Abnormal; Notable for the following components:  ? RBC 4.15 (*)   ? Hemoglobin 12.3 (*)   ? HCT 38.5 (*)   ? All other components within normal limits  ? ? ?EKG ?None ? ?Radiology ?No results found. ? ?Procedures ?Procedures  ? ? ?Medications Ordered in ED ?Medications  ?LORazepam (ATIVAN) injection 1 mg (1 mg Intravenous Given 01/28/22 0802)  ?HYDROmorphone (DILAUDID) injection 1 mg (1 mg Intravenous Given 01/28/22 0802)  ?HYDROmorphone (DILAUDID) injection 1 mg (1 mg Intravenous Given 01/28/22 1055)  ? ? ?ED  Course/ Medical Decision Making/ A&P ?Clinical Course as of 01/30/22 1043  ?Sat Jan 28, 2022  ?1031 Patient would like another dose of narcotic pain medicine for persistent pain, prior to going home.  After that he thinks he can manage his pain by taking his oxycodone immediate release 15 mg  every 6 hours. [EW]  ?1106 He now states that he is ready to go home. [EW]  ?  ?Clinical Course User Index ?[EW] Mancel Bale, MD  ? ?                        ?Medical Decision Making ?Presenting for right flank right lower quadrant pain, present since removing right ureteral stent yesterday with decreased ability to urinate.  No urinary bladder outlet obstruction.  CT renal ordered to evaluate for hydroureter/hydronephrosis.  Pain medicine given. ? ?Problems Addressed: ?Hydronephrosis, unspecified hydronephrosis type: acute illness or injury ?   Details: Started following recent urologic procedure to break up a renal stone ?Right flank pain: acute illness or injury ?   Details: Secondary to retained ureteric stones ? ?Amount and/or Complexity of Data Reviewed ?Independent Historian:  ?   Details: He is a cogent historian ?External Data Reviewed: radiology and notes. ?   Details: Urologic procedure, renal stone break up by laser and prostate procedure ?Labs: ordered. ?   Details: CBC, metabolic panel, urinalysis-abnormal urinalysis, nondiagnostic for UTI, potassium low, calcium low, hemoglobin low ?Radiology: ordered and independent interpretation performed. ?   Details: CT abdomen pelvis, right hydronephrosis with multiple right-sided proximal ureter stones. ?ECG/medicine tests: ordered and independent interpretation performed. ?   Details: Bladder scan-60 mL urine in the urinary bladder. ?Discussion of management or test interpretation with external provider(s): Case discussed with urologist, Dr. Annabell Howells who reviewed the CT images with me.  He states patient okay to go home if he feels up to it.  Patient instructed to return  here if he has persistent pain that cannot be managed.  Patient does not require further ED intervention or hospitalization. ? ?Risk ?Prescription drug management. ?Decision regarding hospitalization. ?Risk Details: Fl

## 2022-01-28 NOTE — ED Notes (Signed)
I provided reinforced discharge education based off of discharge instructions. Pt acknowledged and understood my education. Pt had no further questions/concerns for provider/myself.  °

## 2022-01-28 NOTE — ED Triage Notes (Addendum)
Pt had TURP and R sided cystoscopy 4x days ago w/stent placement. Pt began having severe pain after removing stent and catheter last night. Pt has not voided notably since removal. ? ? ?

## 2022-01-28 NOTE — Discharge Instructions (Signed)
Your pain is caused by combination of swelling in the kidney and stones in the ureter.  Continue taking your pain medicine and use your oxycodone, every 6 hours as needed in addition to your extended release narcotic.  Call the urologist or return here if your symptoms cannot be controlled.  Try to follow-up with your urologist on Monday or Tuesday for a checkup. ?

## 2022-02-07 ENCOUNTER — Other Ambulatory Visit: Payer: Self-pay

## 2022-03-07 NOTE — Patient Instructions (Addendum)
DUE TO COVID-19 ONLY TWO VISITORS  (aged 64 and older)  ARE ALLOWED TO COME WITH YOU AND STAY IN THE WAITING ROOM ONLY DURING PRE OP AND PROCEDURE.   ?**NO VISITORS ARE ALLOWED IN THE SHORT STAY AREA OR RECOVERY ROOM!!** ? ?IF YOU WILL BE ADMITTED INTO THE HOSPITAL YOU ARE ALLOWED ONLY FOUR SUPPORT PEOPLE DURING VISITATION HOURS ONLY (7 AM -8PM)   ?The support person(s) must pass our screening, gel in and out, and wear a mask at all times, including in the patient?s room. ?Patients must also wear a mask when staff or their support person are in the room. ?Visitors GUEST BADGE MUST BE WORN VISIBLY  ?One adult visitor may remain with you overnight and MUST be in the room by 8 P.M. ?  ? ? Your procedure is scheduled on: 03/17/22 ? ? Report to [facility] Main Entrance ? ?  Report to admitting at  8:15 AM ? ? Call this number if you have problems the morning of surgery [phone] ? ? Do not eat food :After Midnight. ? ? After Midnight you may have the following liquids until _5:00 am__ DAY OF SURGERY ? ?Water ?Black Coffee (sugar ok, NO MILK/CREAM OR CREAMERS)  ?Tea (sugar ok, NO MILK/CREAM OR CREAMERS) regular and decaf                             ?Plain Jell-O (NO RED)                                           ?Fruit ices (not with fruit pulp, NO RED)                                     ?Popsicles (NO RED)                                                                  ?Juice: apple, WHITE grape, WHITE cranberry ?Sports drinks like Gatorade (NO RED) ?Clear broth(vegetable,chicken,beef) ? ?             ? ?  ?  ?       If you have questions, please contact your surgeon?s office. ? ?  ?  ?Oral Hygiene is also important to reduce your risk of infection.                                    ?Remember - BRUSH YOUR TEETH THE MORNING OF SURGERY WITH YOUR REGULAR TOOTHPASTE ? ? ? ?Multaq, Gabapentin, Metoprolol, Allopurinol, Tamsulosin ? Take these medicines the morning of surgery with A SIP OF WATER:  ? ? ? Before  surgery.Stop taking __Eliquis_________on _5/1_________as instructed by _____________. ? ? ?Contact your Surgeon/Cardiologist for instructions on Anticoagulant Therapy prior to surgery. ?  ?                  ?           You may  not have any metal on your body including jewelry, and body piercing ? ?           Do not wear lotions, powders, perfumes/cologne, or deodorant ? ? ?            Men may shave face and neck. ? ? Do not bring valuables to the hospital. [facility] IS NOT ?            RESPONSIBLE   FOR VALUABLES. ? ? Contacts, dentures or bridgework may not be worn into surgery. ? ? Bring small overnight bag day of surgery. ?  ? ? Special Instructions: Bring a copy of your healthcare power of attorney and living will documents the day of surgery if you haven't scanned them before. ? ?            Please read over the following fact sheets you were given: IF YOU HAVE QUESTIONS ABOUT YOUR PRE-OP INSTRUCTIONS PLEASE CALL [phone] ? ?   [facility] - Preparing for Surgery ?Before surgery, you can play an important role.  Because skin is not sterile, your skin needs to be as free of germs as possible.  You can reduce the number of germs on your skin by washing with CHG (chlorahexidine gluconate) soap before surgery.  CHG is an antiseptic cleaner which kills germs and bonds with the skin to continue killing germs even after washing. ?Please DO NOT use if you have an allergy to CHG or antibacterial soaps.  If your skin becomes reddened/irritated stop using the CHG and inform your nurse when you arrive at Short Stay. You may shave your face/neck. ?Please follow these instructions carefully: ? 1.  Shower with CHG Soap the night before surgery and the  morning of Surgery. ? 2.  If you choose to wash your hair, wash your hair first as usual with your  normal  shampoo. ? 3.  After you shampoo, rinse your hair and body thoroughly to remove the  shampoo.                  ?          4.  Use CHG as you would any other liquid  soap.  You can apply chg directly  to the skin and wash  ?                     Gently with a scrungie or clean washcloth. ? 5.  Apply the CHG Soap to your body ONLY FROM THE NECK DOWN.   Do not use on face/ open      ?                     Wound or open sores. Avoid contact with eyes, ears mouth and genitals (private parts).  ?                     Engineering geologist,  Genitals (private parts) with your normal soap. ?            6.  Wash thoroughly, paying special attention to the area where your surgery  will be performed. ? 7.  Thoroughly rinse your body with warm water from the neck down. ? 8.  DO NOT shower/wash with your normal soap after using and rinsing off  the CHG Soap. ?               9.  Pat yourself dry with  a clean towel. ?           10.  Wear clean pajamas. ?           11.  Place clean sheets on your bed the night of your first shower and do not  sleep with pets. ?Day of Surgery : ?Do not apply any lotions/deodorants the morning of surgery.  Please wear clean clothes to the hospital/surgery center. ? ?FAILURE TO FOLLOW THESE INSTRUCTIONS MAY RESULT IN THE CANCELLATION OF YOUR SURGERY ? ? ?________________________________________________________________________  ? ?Incentive Spirometer ? ?An incentive spirometer is a tool that can help keep your lungs clear and active. This tool measures how well you are filling your lungs with each breath. Taking long deep breaths may help reverse or decrease the chance of developing breathing (pulmonary) problems (especially infection) following: ?A long period of time when you are unable to move or be active. ?BEFORE THE PROCEDURE  ?If the spirometer includes an indicator to show your best effort, your nurse or respiratory therapist will set it to a desired goal. ?If possible, sit up straight or lean slightly forward. Try not to slouch. ?Hold the incentive spirometer in an upright position. ?INSTRUCTIONS FOR USE  ?Sit on the edge of your bed if possible, or sit up as far as  you can in bed or on a chair. ?Hold the incentive spirometer in an upright position. ?Breathe out normally. ?Place the mouthpiece in your mouth and seal your lips tightly around it. ?Breathe in slowly and as deeply as possible, raising the piston or the ball toward the top of the column. ?Hold your breath for 3-5 seconds or for as long as possible. Allow the piston or ball to fall to the bottom of the column. ?Remove the mouthpiece from your mouth and breathe out normally. ?Rest for a few seconds and repeat Steps 1 through 7 at least 10 times every 1-2 hours when you are awake. Take your time and take a few normal breaths between deep breaths. ?The spirometer may include an indicator to show your best effort. Use the indicator as a goal to work toward during each repetition. ?After each set of 10 deep breaths, practice coughing to be sure your lungs are clear. If you have an incision (the cut made at the time of surgery), support your incision when coughing by placing a pillow or rolled up towels firmly against it. ?Once you are able to get out of bed, walk around indoors and cough well. You may stop using the incentive spirometer when instructed by your caregiver.  ?RISKS AND COMPLICATIONS ?Take your time so you do not get dizzy or light-headed. ?If you are in pain, you may need to take or ask for pain medication before doing incentive spirometry. It is harder to take a deep breath if you are having pain. ?AFTER USE ?Rest and breathe slowly and easily. ?It can be helpful to keep track of a log of your progress. Your caregiver can provide you with a simple table to help with this. ?If you are using the spirometer at home, follow these instructions: ?SEEK MEDICAL CARE IF:  ?You are having difficultly using the spirometer. ?You have trouble using the spirometer as often as instructed. ?Your pain medication is not giving enough relief while using the spirometer. ?You develop fever of 100.5? F (38.1? C) or higher. ?SEEK  IMMEDIATE MEDICAL CARE IF:  ?You cough up bloody sputum that had not been present before. ?You develop fever of 102? F (  38.9? C) or greater. ?You develop worsening pain at or near the incision site. ?MAKE

## 2022-03-08 ENCOUNTER — Other Ambulatory Visit: Payer: Self-pay

## 2022-03-08 ENCOUNTER — Encounter (HOSPITAL_COMMUNITY): Payer: Self-pay

## 2022-03-08 ENCOUNTER — Encounter (HOSPITAL_COMMUNITY)
Admission: RE | Admit: 2022-03-08 | Discharge: 2022-03-08 | Disposition: A | Discharge status: Home / Self Care | Payer: Medicare HMO | Source: Ambulatory Visit | Attending: Orthopaedic Surgery | Admitting: Orthopaedic Surgery

## 2022-03-08 VITALS — BP 142/82 | HR 58 | Temp 98.3°F | Resp 18 | Ht 71.0 in | Wt 260.0 lb

## 2022-03-08 DIAGNOSIS — Z01812 Encounter for preprocedural laboratory examination: Secondary | ICD-10-CM | POA: Diagnosis present

## 2022-03-08 DIAGNOSIS — Z01818 Encounter for other preprocedural examination: Secondary | ICD-10-CM

## 2022-03-08 DIAGNOSIS — E119 Type 2 diabetes mellitus without complications: Secondary | ICD-10-CM | POA: Diagnosis not present

## 2022-03-08 LAB — BASIC METABOLIC PANEL
Anion gap: 7 (ref 5–15)
BUN: 14 mg/dL (ref 8–23)
CO2: 24 mmol/L (ref 22–32)
Calcium: 9.4 mg/dL (ref 8.9–10.3)
Chloride: 107 mmol/L (ref 98–111)
Creatinine, Ser: 1.02 mg/dL (ref 0.61–1.24)
GFR, Estimated: >60 mL/min (ref >60)
Glucose, Bld: 115 mg/dL — ABNORMAL HIGH (ref 70–99)
Potassium: 4.8 mmol/L (ref 3.5–5.1)
Sodium: 138 mmol/L (ref 135–145)

## 2022-03-08 LAB — CBC
HCT: 43.4 % (ref 39.0–52.0)
Hemoglobin: 13.7 g/dL (ref 13.0–17.0)
MCH: 28.5 pg (ref 26.0–34.0)
MCHC: 31.6 g/dL (ref 30.0–36.0)
MCV: 90.4 fL (ref 80.0–100.0)
Platelets: 269 10*3/uL (ref 150–400)
RBC: 4.8 MIL/uL (ref 4.22–5.81)
RDW: 13.9 % (ref 11.5–15.5)
WBC: 7.7 10*3/uL (ref 4.0–10.5)
nRBC: 0 % (ref 0.0–0.2)

## 2022-03-08 LAB — SURGICAL PCR SCREEN
MRSA, PCR: NEGATIVE
Staphylococcus aureus: POSITIVE — AB

## 2022-03-08 LAB — HEMOGLOBIN A1C
Hgb A1c MFr Bld: 6.2 % — ABNORMAL HIGH (ref 4.8–5.6)
Mean Plasma Glucose: 131.24 mg/dL

## 2022-03-08 LAB — GLUCOSE, CAPILLARY: Glucose-Capillary: 119 mg/dL — ABNORMAL HIGH (ref 70–99)

## 2022-03-08 NOTE — Progress Notes (Signed)
Anesthesia note: ? ?Bowel prep reminder:na ? ?PCP - Dr. Lillie Fragmin ?Cardiologist -Tawni Levy PA ?Other-  ? ?Chest x-ray - 2022-epic ?EKG - 11/08/22-epic ?Stress Test -no  ?ECHO - no ?Cardiac Cath - no ? ?Pacemaker/ICD device last checked:NA ? ?Sleep Study - no ?CPAP -  ? 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 ?Blood Thinner:Eliquis for A-Fib/ Parker Daniel ?Blood Thinner Instructions:hold 3 days/ [name] ?Aspirin Instructions: ?Last Dose:03/13/22 ? ?Anesthesia review: yes ? ?Patient denies shortness of breath, fever, cough and chest pain at PAT appointment ?Pt takes Xtamza for chronic back and hip pain. He reports some SOB with exertion but none with ADLs ? ?Patient verbalized understanding of instructions that were given to them at the PAT appointment. Patient was also instructed that they will need to review over the PAT instructions again at home before surgery. yes ?

## 2022-03-09 ENCOUNTER — Other Ambulatory Visit: Payer: Self-pay | Admitting: Physician Assistant

## 2022-03-09 DIAGNOSIS — M1711 Unilateral primary osteoarthritis, right knee: Secondary | ICD-10-CM

## 2022-03-13 ENCOUNTER — Encounter (HOSPITAL_COMMUNITY): Payer: Self-pay

## 2022-03-16 NOTE — H&P (Signed)
TOTAL KNEE ADMISSION H&P ? ?Patient is being admitted for right total knee arthroplasty. ? ?Subjective: ? ?Chief Complaint:right knee pain. ? ?HPI: ADRIEL KAUN, 66 y.o. male, has a history of pain and functional disability in the right knee due to arthritis and has failed non-surgical conservative treatments for greater than 12 weeks to includeNSAID's and/or analgesics, corticosteriod injections, viscosupplementation injections, flexibility and strengthening excercises, use of assistive devices, weight reduction as appropriate, and activity modification.  Onset of symptoms was gradual, starting 5 years ago with gradually worsening course since that time. The patient noted prior procedures on the knee to include  arthroscopy on the right knee(s).  Patient currently rates pain in the right knee(s) at 10 out of 10 with activity. Patient has night pain, worsening of pain with activity and weight bearing, pain that interferes with activities of daily living, pain with passive range of motion, crepitus, and joint swelling.  Patient has evidence of subchondral sclerosis, periarticular osteophytes, and joint space narrowing by imaging studies. There is no active infection. ? ?Patient Active Problem List  ? Diagnosis Date Noted  ? Unilateral primary osteoarthritis, right knee 08/07/2019  ? History of arthroplasty of left hip 06/28/2019  ? 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 Unilateral primary osteoarthritis, left hip 04/22/2019  ? ?Past Medical History:  ?Diagnosis Date  ? Arthritis   ? Dyspnea   ? with exertion  ? GERD (gastroesophageal reflux disease)   ? Gout   ? History of kidney stones   ? Hyperlipidemia   ? Hypertension   ? Pneumonia   ? 10/2021  ?  ?Past Surgical History:  ?Procedure Laterality Date  ? APPENDECTOMY    ? age 41  ? CHOLECYSTECTOMY    ? age 64  ? CYSTOSCOPY/URETEROSCOPY/HOLMIUM LASER/STENT PLACEMENT Right 01/24/2022  ? Procedure: [id number] LASER/STENT  PLACEMENT;  Surgeon: Festus Aloe, MD;  Location: [facility];  Service: Urology;  Laterality: Right;  ? KNEE SURGERY    ? SURGERY SCROTAL / TESTICULAR    ? age 65  ? THULIUM LASER TURP (TRANSURETHRAL RESECTION OF PROSTATE) N/A 01/24/2022  ? Procedure: THULIUM LASER TURP (TRANSURETHRAL RESECTION OF PROSTATE);  Surgeon: Festus Aloe, MD;  Location: [facility];  Service: Urology;  Laterality: N/A;  ? TONSILLECTOMY    ? TOTAL HIP ARTHROPLASTY Left 06/27/2019  ? Procedure: LEFT TOTAL HIP ARTHROPLASTY ANTERIOR APPROACH;  Surgeon: Mcarthur Rossetti, MD;  Location: [facility];  Service: Orthopedics;  Laterality: Left;  ?  ?No current facility-administered medications for this encounter.  ? ?Current Outpatient Medications  ?Medication Sig Dispense Refill Last Dose  ? acetaminophen (TYLENOL) 500 MG tablet Take 1,000 mg by mouth every 8 (eight) hours as needed for moderate pain.     ? allopurinol (ZYLOPRIM) 300 MG tablet Take 300 mg by mouth daily at 6 PM. Pt takes at 6 pm at nite     ? Calcium Carb-Cholecalciferol (CALCIUM+D3) 500-10 MG-MCG TABS Take 1 tablet by mouth daily.     ? Cholecalciferol (VITAMIN D) 50 MCG (2000 UT) CAPS Take 4,000 Units by mouth daily.     ? ELDERBERRY PO Take 2 tablets by mouth daily. Vitamin C & Zinc     ? ELIQUIS 5 MG TABS tablet Take 1 tablet (5 mg total) by mouth 2 (two) times daily. 60 tablet    ? gabapentin (NEURONTIN) 300 MG capsule Take 600 mg by mouth 3 (three) times daily.     ? ibuprofen (ADVIL) 200 MG tablet Take 400  mg by mouth every 8 (eight) hours as needed for moderate pain.     ? metoprolol succinate (TOPROL-XL) 25 MG 24 hr tablet Take 25 mg by mouth in the morning.     ? MULTAQ 400 MG tablet Take 400 mg by mouth 2 (two) times daily. Pt takes at 6pm and 6 am     ? Multiple Vitamins-Minerals (MULTIVITAMIN ADULT) CHEW Chew 1 each by mouth daily. Sugar Free     ? Omega-3 Fatty Acids (OMEGA-3 PLUS PO) Take 2 tablets by mouth daily. Max     ? oxyCODONE (ROXICODONE) 15 MG immediate  release tablet Take 15 mg by mouth 2 (two) times daily as needed for pain.     ? pravastatin (PRAVACHOL) 40 MG tablet Take 40 mg by mouth at bedtime.     ? senna (SENOKOT) 8.6 MG tablet Take 2-3 tablets by mouth daily as needed for constipation.     ? tamsulosin (FLOMAX) 0.4 MG CAPS capsule Take 0.4 mg by mouth at bedtime. 9 pm     ? testosterone cypionate (DEPOTESTOSTERONE CYPIONATE) 200 MG/ML injection Inject 200 mg into the muscle every 21 ( twenty-one) days.     ? traZODone (DESYREL) 50 MG tablet Take 50 mg by mouth at bedtime.     ? vitamin C (ASCORBIC ACID) 250 MG tablet Take 500 mg by mouth daily.     ? XTAMPZA ER 18 MG C12A Take 18 mg by mouth every 12 (twelve) hours. Pt takes at 6pm and 6 am     ? ?Allergies  ?Allergen Reactions  ? Atorvastatin Other (See Comments)  ?  Other reaction(s): Myalgia ?Takes pravastatin at home  ? Rosuvastatin Other (See Comments)  ?  Other reaction(s): Myalgia ?Takes pravastatin at home  ?  ?Social History  ? ?Tobacco Use  ? Smoking status: Never  ? Smokeless tobacco: Never  ?Substance Use Topics  ? Alcohol use: Yes  ?  Comment: rare  ?  ?Family History  ?Problem Relation Age of Onset  ? Cancer Father   ?     bladder  ?  ? ?Review of Systems  ?Musculoskeletal:  Positive for gait problem and joint swelling.  ?All other systems reviewed and are negative. ? ?Objective: ? ?Physical Exam ?Vitals reviewed.  ?Constitutional:   ?   Appearance: Normal appearance.  ?HENT:  ?   Head: Normocephalic and atraumatic.  ?Eyes:  ?   Extraocular Movements: Extraocular movements intact.  ?   Pupils: Pupils are equal, round, and reactive to light.  ?Cardiovascular:  ?   Rate and Rhythm: Normal rate.  ?   Pulses: Normal pulses.  ?Pulmonary:  ?   Effort: Pulmonary effort is normal.  ?   Breath sounds: Normal breath sounds.  ?Abdominal:  ?   Palpations: Abdomen is soft.  ?Musculoskeletal:  ?   Cervical back: Normal range of motion and neck supple.  ?   Right knee: Effusion, bony tenderness and crepitus  present. Decreased range of motion. Tenderness present over the medial joint line, lateral joint line and patellar tendon. Abnormal alignment.  ?Neurological:  ?   Mental Status: He is alert and oriented to person, place, and time.  ?Psychiatric:     ?   Behavior: Behavior normal.  ? ? ?Vital signs in last 24 hours: ?  ? ?Labs: ? ? ?Estimated body mass index is 36.26 kg/m? as calculated from the following: ?  Height as of 03/08/22: 5\' 11"  (1.803 m). ?  Weight as  of 03/08/22: 117.9 kg. ? ? ?Imaging Review ?Plain radiographs demonstrate severe degenerative joint disease of the right knee(s). The overall alignment issignificant varus. The bone quality appears to be good for age and reported activity level. ? ? ? ? ? ?Assessment/Plan: ? ?End stage arthritis, right knee  ? ?The patient history, physical examination, clinical judgment of the provider and imaging studies are consistent with end stage degenerative joint disease of the right knee(s) and total knee arthroplasty is deemed medically necessary. The treatment options including medical management, injection therapy arthroscopy and arthroplasty were discussed at length. The risks and benefits of total knee arthroplasty were presented and reviewed. The risks due to aseptic loosening, infection, stiffness, patella tracking problems, thromboembolic complications and other imponderables were discussed. The patient acknowledged the explanation, agreed to proceed with the plan and consent was signed. Patient is being admitted for inpatient treatment for surgery, pain control, PT, OT, prophylactic antibiotics, VTE prophylaxis, progressive ambulation and ADL's and discharge planning. The patient is planning to be discharged home with home health services ? ? ? ? ?

## 2022-03-17 ENCOUNTER — Observation Stay (HOSPITAL_COMMUNITY)
Admission: RE | Admit: 2022-03-17 | Discharge: 2022-03-20 | Disposition: A | Discharge status: Home Health | Payer: Medicare HMO | Attending: Orthopaedic Surgery | Admitting: Orthopaedic Surgery

## 2022-03-17 ENCOUNTER — Observation Stay (HOSPITAL_COMMUNITY): Payer: Medicare HMO

## 2022-03-17 ENCOUNTER — Other Ambulatory Visit: Payer: Self-pay

## 2022-03-17 ENCOUNTER — Encounter (HOSPITAL_COMMUNITY): Payer: Self-pay | Admitting: Orthopaedic Surgery

## 2022-03-17 ENCOUNTER — Encounter (HOSPITAL_COMMUNITY)
Admission: RE | Disposition: A | Discharge status: Home Health | Payer: Self-pay | Source: Home / Self Care | Attending: Orthopaedic Surgery

## 2022-03-17 ENCOUNTER — Ambulatory Visit (HOSPITAL_BASED_OUTPATIENT_CLINIC_OR_DEPARTMENT_OTHER): Payer: Medicare HMO | Admitting: Anesthesiology

## 2022-03-17 ENCOUNTER — Ambulatory Visit (HOSPITAL_COMMUNITY): Payer: Medicare HMO | Admitting: Anesthesiology

## 2022-03-17 DIAGNOSIS — Z7901 Long term (current) use of anticoagulants: Secondary | ICD-10-CM | POA: Diagnosis not present

## 2022-03-17 DIAGNOSIS — M1711 Unilateral primary osteoarthritis, right knee: Secondary | ICD-10-CM

## 2022-03-17 DIAGNOSIS — I4891 Unspecified atrial fibrillation: Secondary | ICD-10-CM

## 2022-03-17 DIAGNOSIS — E119 Type 2 diabetes mellitus without complications: Secondary | ICD-10-CM

## 2022-03-17 DIAGNOSIS — Z96651 Presence of right artificial knee joint: Secondary | ICD-10-CM

## 2022-03-17 DIAGNOSIS — I10 Essential (primary) hypertension: Secondary | ICD-10-CM | POA: Diagnosis not present

## 2022-03-17 DIAGNOSIS — Z01818 Encounter for other preprocedural examination: Secondary | ICD-10-CM

## 2022-03-17 DIAGNOSIS — Z79899 Other long term (current) drug therapy: Secondary | ICD-10-CM | POA: Insufficient documentation

## 2022-03-17 DIAGNOSIS — Z96642 Presence of left artificial hip joint: Secondary | ICD-10-CM | POA: Insufficient documentation

## 2022-03-17 HISTORY — PX: TOTAL KNEE ARTHROPLASTY: SHX125

## 2022-03-17 LAB — TYPE AND SCREEN
ABO/RH(D): O NEG
Antibody Screen: NEGATIVE

## 2022-03-17 LAB — ABO/RH: ABO/RH(D): O NEG

## 2022-03-17 SURGERY — ARTHROPLASTY, KNEE, TOTAL
Anesthesia: Spinal | Site: Knee | Laterality: Right

## 2022-03-17 MED ORDER — OYSTER SHELL CALCIUM/D3 500-5 MG-MCG PO TABS
1.0000 | ORAL_TABLET | Freq: Every day | ORAL | Status: DC
  Administered 2022-03-17 – 2022-03-20 (×4): 1 via ORAL
  Filled 2022-03-17 (×4): qty 1

## 2022-03-17 MED ORDER — HYDROMORPHONE HCL 2 MG PO TABS
2.0000 mg | ORAL_TABLET | ORAL | Status: DC | PRN
  Administered 2022-03-17 – 2022-03-18 (×5): 2 mg via ORAL
  Administered 2022-03-19 (×2): 3 mg via ORAL
  Administered 2022-03-19: 2 mg via ORAL
  Administered 2022-03-20 (×2): 3 mg via ORAL
  Filled 2022-03-17 (×2): qty 1
  Filled 2022-03-17: qty 2
  Filled 2022-03-17: qty 1
  Filled 2022-03-17 (×2): qty 2
  Filled 2022-03-17 (×2): qty 1
  Filled 2022-03-17: qty 2

## 2022-03-17 MED ORDER — ACETAMINOPHEN 500 MG PO TABS
1000.0000 mg | ORAL_TABLET | Freq: Once | ORAL | Status: AC
  Administered 2022-03-17: 1000 mg via ORAL
  Filled 2022-03-17: qty 2

## 2022-03-17 MED ORDER — ONDANSETRON HCL 4 MG/2ML IJ SOLN: 4.0000 mg | Freq: Four times a day (QID) | INTRAMUSCULAR | Status: DC | PRN

## 2022-03-17 MED ORDER — METHOCARBAMOL 500 MG PO TABS: 500.0000 mg | ORAL_TABLET | Freq: Four times a day (QID) | ORAL | Status: DC | PRN

## 2022-03-17 MED ORDER — METHOCARBAMOL 500 MG IVPB - SIMPLE MED
500.0000 mg | Freq: Four times a day (QID) | INTRAVENOUS | Status: DC | PRN
  Administered 2022-03-17: 500 mg via INTRAVENOUS
  Filled 2022-03-17: qty 50

## 2022-03-17 MED ORDER — SODIUM CHLORIDE 0.9 % IV SOLN: INTRAVENOUS | Status: DC

## 2022-03-17 MED ORDER — PROPOFOL 10 MG/ML IV BOLUS
INTRAVENOUS | Status: DC | PRN
  Administered 2022-03-17 (×3): 20 mg via INTRAVENOUS

## 2022-03-17 MED ORDER — DIAZEPAM 5 MG PO TABS
5.0000 mg | ORAL_TABLET | Freq: Once | ORAL | Status: AC
  Administered 2022-03-17: 5 mg via ORAL
  Filled 2022-03-17: qty 1

## 2022-03-17 MED ORDER — OXYCODONE HCL 5 MG/5ML PO SOLN: 5.0000 mg | Freq: Once | ORAL | Status: DC | PRN

## 2022-03-17 MED ORDER — HYDROMORPHONE HCL 1 MG/ML IJ SOLN
0.5000 mg | INTRAMUSCULAR | Status: DC | PRN
  Administered 2022-03-17 – 2022-03-18 (×2): 1 mg via INTRAVENOUS
  Filled 2022-03-17 (×2): qty 1

## 2022-03-17 MED ORDER — OXYCODONE HCL ER 20 MG PO T12A
20.0000 mg | EXTENDED_RELEASE_TABLET | Freq: Two times a day (BID) | ORAL | Status: DC
  Administered 2022-03-17 – 2022-03-20 (×6): 20 mg via ORAL
  Filled 2022-03-17 (×6): qty 1

## 2022-03-17 MED ORDER — ORAL CARE MOUTH RINSE: 15.0000 mL | Freq: Once | OROMUCOSAL | Status: AC

## 2022-03-17 MED ORDER — LACTATED RINGERS IV SOLN: INTRAVENOUS | Status: DC

## 2022-03-17 MED ORDER — GABAPENTIN 300 MG PO CAPS
600.0000 mg | ORAL_CAPSULE | Freq: Three times a day (TID) | ORAL | Status: DC
Start: 2022-03-17 — End: 2022-03-20
  Administered 2022-03-17 – 2022-03-20 (×8): 600 mg via ORAL
  Filled 2022-03-17 (×8): qty 2

## 2022-03-17 MED ORDER — HYDROMORPHONE HCL 1 MG/ML IJ SOLN
INTRAMUSCULAR | Status: AC
  Filled 2022-03-17: qty 1

## 2022-03-17 MED ORDER — VITAMIN D 25 MCG (1000 UNIT) PO TABS
4000.0000 [IU] | ORAL_TABLET | Freq: Every day | ORAL | Status: DC
  Administered 2022-03-18 – 2022-03-20 (×3): 4000 [IU] via ORAL
  Filled 2022-03-17 (×3): qty 4

## 2022-03-17 MED ORDER — ONDANSETRON HCL 4 MG/2ML IJ SOLN
INTRAMUSCULAR | Status: AC
  Filled 2022-03-17: qty 2

## 2022-03-17 MED ORDER — CEFAZOLIN SODIUM-DEXTROSE 1-4 GM/50ML-% IV SOLN
1.0000 g | Freq: Four times a day (QID) | INTRAVENOUS | Status: AC
  Administered 2022-03-17 – 2022-03-18 (×2): 1 g via INTRAVENOUS
  Filled 2022-03-17 (×2): qty 50

## 2022-03-17 MED ORDER — ALLOPURINOL 300 MG PO TABS
300.0000 mg | ORAL_TABLET | Freq: Every day | ORAL | Status: DC
  Administered 2022-03-17 – 2022-03-19 (×3): 300 mg via ORAL
  Filled 2022-03-17 (×3): qty 1

## 2022-03-17 MED ORDER — FENTANYL CITRATE PF 50 MCG/ML IJ SOSY
50.0000 ug | PREFILLED_SYRINGE | INTRAMUSCULAR | Status: DC
  Administered 2022-03-17: 100 ug via INTRAVENOUS
  Filled 2022-03-17: qty 2

## 2022-03-17 MED ORDER — DRONEDARONE HCL 400 MG PO TABS
400.0000 mg | ORAL_TABLET | Freq: Two times a day (BID) | ORAL | Status: DC
  Administered 2022-03-17 – 2022-03-20 (×6): 400 mg via ORAL
  Filled 2022-03-17 (×6): qty 1

## 2022-03-17 MED ORDER — METOCLOPRAMIDE HCL 5 MG PO TABS: 5.0000 mg | ORAL_TABLET | Freq: Three times a day (TID) | ORAL | Status: DC | PRN

## 2022-03-17 MED ORDER — ROPIVACAINE HCL 7.5 MG/ML IJ SOLN
INTRAMUSCULAR | Status: DC | PRN
  Administered 2022-03-17: 20 mL via PERINEURAL

## 2022-03-17 MED ORDER — MIDAZOLAM HCL 2 MG/2ML IJ SOLN: 0.5000 mg | Freq: Once | INTRAMUSCULAR | Status: DC | PRN

## 2022-03-17 MED ORDER — TAMSULOSIN HCL 0.4 MG PO CAPS
0.4000 mg | ORAL_CAPSULE | Freq: Every day | ORAL | Status: DC
  Administered 2022-03-17 – 2022-03-19 (×3): 0.4 mg via ORAL
  Filled 2022-03-17 (×3): qty 1

## 2022-03-17 MED ORDER — DIPHENHYDRAMINE HCL 12.5 MG/5ML PO ELIX: 12.5000 mg | ORAL_SOLUTION | ORAL | Status: DC | PRN

## 2022-03-17 MED ORDER — ASCORBIC ACID 500 MG PO TABS
500.0000 mg | ORAL_TABLET | Freq: Every day | ORAL | Status: DC
  Administered 2022-03-18 – 2022-03-20 (×3): 500 mg via ORAL
  Filled 2022-03-17 (×3): qty 1

## 2022-03-17 MED ORDER — HYDROMORPHONE HCL 2 MG PO TABS
1.0000 mg | ORAL_TABLET | ORAL | Status: DC | PRN
  Administered 2022-03-20: 2 mg via ORAL
  Filled 2022-03-17: qty 1

## 2022-03-17 MED ORDER — 0.9 % SODIUM CHLORIDE (POUR BTL) OPTIME
TOPICAL | Status: DC | PRN
  Administered 2022-03-17: 1000 mL

## 2022-03-17 MED ORDER — PHENOL 1.4 % MT LIQD: 1.0000 | OROMUCOSAL | Status: DC | PRN

## 2022-03-17 MED ORDER — KETOROLAC TROMETHAMINE 30 MG/ML IJ SOLN
30.0000 mg | Freq: Once | INTRAMUSCULAR | Status: AC
Start: 2022-03-17 — End: 2022-03-17

## 2022-03-17 MED ORDER — PHENYLEPHRINE HCL-NACL 20-0.9 MG/250ML-% IV SOLN
INTRAVENOUS | Status: DC | PRN
Start: 2022-03-17 — End: 2022-03-17
  Administered 2022-03-17: 35 ug/min via INTRAVENOUS

## 2022-03-17 MED ORDER — APIXABAN 5 MG PO TABS
5.0000 mg | ORAL_TABLET | Freq: Two times a day (BID) | ORAL | Status: DC
  Administered 2022-03-18 – 2022-03-20 (×5): 5 mg via ORAL
  Filled 2022-03-17 (×5): qty 1

## 2022-03-17 MED ORDER — ALUM & MAG HYDROXIDE-SIMETH 200-200-20 MG/5ML PO SUSP: 30.0000 mL | ORAL | Status: DC | PRN

## 2022-03-17 MED ORDER — HYDROMORPHONE HCL 1 MG/ML IJ SOLN
1.0000 mg | INTRAMUSCULAR | Status: DC | PRN
  Administered 2022-03-17 (×3): 2 mg via INTRAVENOUS
  Administered 2022-03-18: 1 mg via INTRAVENOUS
  Administered 2022-03-18 (×3): 2 mg via INTRAVENOUS
  Administered 2022-03-19: 1 mg via INTRAVENOUS
  Administered 2022-03-19 (×4): 2 mg via INTRAVENOUS
  Filled 2022-03-17 (×8): qty 2
  Filled 2022-03-17: qty 1
  Filled 2022-03-17 (×2): qty 2

## 2022-03-17 MED ORDER — PRAVASTATIN SODIUM 40 MG PO TABS
40.0000 mg | ORAL_TABLET | Freq: Every day | ORAL | Status: DC
  Administered 2022-03-17 – 2022-03-19 (×3): 40 mg via ORAL
  Filled 2022-03-17 (×3): qty 1

## 2022-03-17 MED ORDER — HYDROMORPHONE HCL 1 MG/ML IJ SOLN: 0.2500 mg | INTRAMUSCULAR | Status: DC | PRN

## 2022-03-17 MED ORDER — HYDROMORPHONE HCL 2 MG PO TABS
ORAL_TABLET | ORAL | Status: AC
  Filled 2022-03-17: qty 1

## 2022-03-17 MED ORDER — TIZANIDINE HCL 4 MG PO TABS
4.0000 mg | ORAL_TABLET | Freq: Four times a day (QID) | ORAL | Status: DC | PRN
  Administered 2022-03-17 – 2022-03-20 (×6): 4 mg via ORAL
  Filled 2022-03-17 (×6): qty 1

## 2022-03-17 MED ORDER — PROPOFOL 500 MG/50ML IV EMUL
INTRAVENOUS | Status: DC | PRN
  Administered 2022-03-17: 125 ug/kg/min via INTRAVENOUS

## 2022-03-17 MED ORDER — MIDAZOLAM HCL 2 MG/2ML IJ SOLN
1.0000 mg | INTRAMUSCULAR | Status: DC
  Administered 2022-03-17: 2 mg via INTRAVENOUS
  Filled 2022-03-17: qty 2

## 2022-03-17 MED ORDER — ONDANSETRON HCL 4 MG/2ML IJ SOLN
INTRAMUSCULAR | Status: DC | PRN
  Administered 2022-03-17: 4 mg via INTRAVENOUS

## 2022-03-17 MED ORDER — ADULT MULTIVITAMIN W/MINERALS CH
1.0000 | ORAL_TABLET | Freq: Every day | ORAL | Status: DC
  Administered 2022-03-18 – 2022-03-20 (×3): 1 via ORAL
  Filled 2022-03-17 (×3): qty 1

## 2022-03-17 MED ORDER — BUPIVACAINE-EPINEPHRINE 0.25% -1:200000 IJ SOLN
INTRAMUSCULAR | Status: DC | PRN
  Administered 2022-03-17: 30 mL

## 2022-03-17 MED ORDER — ZOLPIDEM TARTRATE 5 MG PO TABS
5.0000 mg | ORAL_TABLET | Freq: Every evening | ORAL | Status: DC | PRN
  Administered 2022-03-17 – 2022-03-19 (×3): 5 mg via ORAL
  Filled 2022-03-17 (×3): qty 1

## 2022-03-17 MED ORDER — MENTHOL 3 MG MT LOZG: 1.0000 | LOZENGE | OROMUCOSAL | Status: DC | PRN

## 2022-03-17 MED ORDER — SODIUM CHLORIDE 0.9 % IR SOLN
Status: DC | PRN
  Administered 2022-03-17: 1000 mL

## 2022-03-17 MED ORDER — METHOCARBAMOL 500 MG IVPB - SIMPLE MED
INTRAVENOUS | Status: AC
  Filled 2022-03-17: qty 50

## 2022-03-17 MED ORDER — CEFAZOLIN SODIUM-DEXTROSE 2-4 GM/100ML-% IV SOLN
2.0000 g | INTRAVENOUS | Status: AC
  Administered 2022-03-17: 2 g via INTRAVENOUS
  Filled 2022-03-17: qty 100

## 2022-03-17 MED ORDER — METOCLOPRAMIDE HCL 5 MG/ML IJ SOLN: 5.0000 mg | Freq: Three times a day (TID) | INTRAMUSCULAR | Status: DC | PRN

## 2022-03-17 MED ORDER — KETOROLAC TROMETHAMINE 15 MG/ML IJ SOLN
15.0000 mg | Freq: Once | INTRAMUSCULAR | Status: AC
Start: 2022-03-17 — End: 2022-03-17
  Administered 2022-03-17: 15 mg via INTRAVENOUS
  Filled 2022-03-17: qty 1

## 2022-03-17 MED ORDER — PROPOFOL 1000 MG/100ML IV EMUL
INTRAVENOUS | Status: AC
  Filled 2022-03-17: qty 100

## 2022-03-17 MED ORDER — OXYCODONE HCL 5 MG PO TABS: 5.0000 mg | ORAL_TABLET | Freq: Once | ORAL | Status: DC | PRN

## 2022-03-17 MED ORDER — CHLORHEXIDINE GLUCONATE 0.12 % MT SOLN
15.0000 mL | Freq: Once | OROMUCOSAL | Status: AC
  Administered 2022-03-17: 15 mL via OROMUCOSAL

## 2022-03-17 MED ORDER — ONDANSETRON HCL 4 MG PO TABS: 4.0000 mg | ORAL_TABLET | Freq: Four times a day (QID) | ORAL | Status: DC | PRN

## 2022-03-17 MED ORDER — MEPERIDINE HCL 50 MG/ML IJ SOLN: 6.2500 mg | INTRAMUSCULAR | Status: DC | PRN

## 2022-03-17 MED ORDER — PANTOPRAZOLE SODIUM 40 MG PO TBEC
40.0000 mg | DELAYED_RELEASE_TABLET | Freq: Every day | ORAL | Status: DC
  Administered 2022-03-17 – 2022-03-20 (×4): 40 mg via ORAL
  Filled 2022-03-17 (×4): qty 1

## 2022-03-17 MED ORDER — STERILE WATER FOR IRRIGATION IR SOLN
Status: DC | PRN
  Administered 2022-03-17: 2000 mL

## 2022-03-17 MED ORDER — KETOROLAC TROMETHAMINE 30 MG/ML IJ SOLN
INTRAMUSCULAR | Status: AC
  Administered 2022-03-17: 30 mg via INTRAVENOUS
  Filled 2022-03-17: qty 1

## 2022-03-17 MED ORDER — DOCUSATE SODIUM 100 MG PO CAPS
100.0000 mg | ORAL_CAPSULE | Freq: Two times a day (BID) | ORAL | Status: DC
  Administered 2022-03-17 – 2022-03-20 (×6): 100 mg via ORAL
  Filled 2022-03-17 (×6): qty 1

## 2022-03-17 MED ORDER — TRANEXAMIC ACID-NACL 1000-0.7 MG/100ML-% IV SOLN
1000.0000 mg | INTRAVENOUS | Status: AC
  Administered 2022-03-17: 1000 mg via INTRAVENOUS
  Filled 2022-03-17: qty 100

## 2022-03-17 MED ORDER — ACETAMINOPHEN 325 MG PO TABS
325.0000 mg | ORAL_TABLET | Freq: Four times a day (QID) | ORAL | Status: DC | PRN
  Administered 2022-03-19 – 2022-03-20 (×3): 650 mg via ORAL
  Filled 2022-03-17 (×3): qty 2

## 2022-03-17 MED ORDER — BUPIVACAINE IN DEXTROSE 0.75-8.25 % IT SOLN
INTRATHECAL | Status: DC | PRN
  Administered 2022-03-17: 15 mg via INTRATHECAL

## 2022-03-17 MED ORDER — METOPROLOL SUCCINATE ER 25 MG PO TB24
25.0000 mg | ORAL_TABLET | Freq: Every day | ORAL | Status: DC
  Administered 2022-03-18 – 2022-03-20 (×3): 25 mg via ORAL
  Filled 2022-03-17 (×3): qty 1

## 2022-03-17 MED ORDER — POVIDONE-IODINE 10 % EX SWAB
2.0000 "application " | Freq: Once | CUTANEOUS | Status: AC
  Administered 2022-03-17: 2 via TOPICAL

## 2022-03-17 SURGICAL SUPPLY — 66 items
APL SKNCLS STERI-STRIP NONHPOA (GAUZE/BANDAGES/DRESSINGS) ×1
BAG COUNTER SPONGE SURGICOUNT (BAG) ×1 IMPLANT
BAG SPEC THK2 15X12 ZIP CLS (MISCELLANEOUS) ×1
BAG SPNG CNTER NS LX DISP (BAG) ×1
BAG ZIPLOCK 12X15 (MISCELLANEOUS) ×2 IMPLANT
BENZOIN TINCTURE PRP APPL 2/3 (GAUZE/BANDAGES/DRESSINGS) ×1 IMPLANT
BLADE SAG 18X100X1.27 (BLADE) ×2 IMPLANT
BLADE SURG SZ10 CARB STEEL (BLADE) ×4 IMPLANT
BNDG ELASTIC 6X5.8 VLCR STR LF (GAUZE/BANDAGES/DRESSINGS) ×3 IMPLANT
BOWL SMART MIX CTS (DISPOSABLE) ×1 IMPLANT
BSPLAT TIB 5D F CMNT STM RT (Knees) ×1 IMPLANT
CEMENT BONE R 1X40 (Cement) ×2 IMPLANT
COMP PATELLAR 35 STD 9 THK (Orthopedic Implant) IMPLANT
COOLER ICEMAN CLASSIC (MISCELLANEOUS) ×2 IMPLANT
COVER SURGICAL LIGHT HANDLE (MISCELLANEOUS) ×2 IMPLANT
CUFF TOURN SGL QUICK 34 (TOURNIQUET CUFF) ×2
CUFF TRNQT CYL 34X4.125X (TOURNIQUET CUFF) ×1 IMPLANT
DRAPE INCISE IOBAN 66X45 STRL (DRAPES) ×2 IMPLANT
DRAPE U-SHAPE 47X51 STRL (DRAPES) ×2 IMPLANT
DRSG PAD ABDOMINAL 8X10 ST (GAUZE/BANDAGES/DRESSINGS) ×4 IMPLANT
DURAPREP 26ML APPLICATOR (WOUND CARE) ×2 IMPLANT
ELECT BLADE TIP CTD 4 INCH (ELECTRODE) ×2 IMPLANT
ELECT REM PT RETURN 15FT ADLT (MISCELLANEOUS) ×2 IMPLANT
FEMUR CMT CR STD SZ 8 RT KNEE (Joint) ×2 IMPLANT
FEMUR CMTD CR STD SZ 8 RT KNEE (Joint) IMPLANT
GAUZE SPONGE 4X4 12PLY STRL (GAUZE/BANDAGES/DRESSINGS) ×2 IMPLANT
GAUZE XEROFORM 1X8 LF (GAUZE/BANDAGES/DRESSINGS) ×1 IMPLANT
GLOVE BIO SURGEON STRL SZ7.5 (GLOVE) ×3 IMPLANT
GLOVE BIOGEL PI IND STRL 8 (GLOVE) ×2 IMPLANT
GLOVE BIOGEL PI INDICATOR 8 (GLOVE) ×3
GLOVE ECLIPSE 8.0 STRL XLNG CF (GLOVE) ×3 IMPLANT
GOWN STRL REUS W/ TWL XL LVL3 (GOWN DISPOSABLE) ×2 IMPLANT
GOWN STRL REUS W/TWL XL LVL3 (GOWN DISPOSABLE) ×6
HANDPIECE INTERPULSE COAX TIP (DISPOSABLE) ×2
HDLS TROCR DRIL PIN KNEE 75 (PIN) ×2
HOLDER FOLEY CATH W/STRAP (MISCELLANEOUS) ×1 IMPLANT
IMMOBILIZER KNEE 20 (SOFTGOODS) ×2
IMMOBILIZER KNEE 20 THIGH 36 (SOFTGOODS) ×1 IMPLANT
INSERT TIB ARTISURF SZ8-11X12 (Insert) ×1 IMPLANT
KIT TURNOVER KIT A (KITS) ×1 IMPLANT
NS IRRIG 1000ML POUR BTL (IV SOLUTION) ×2 IMPLANT
PACK TOTAL KNEE CUSTOM (KITS) ×2 IMPLANT
PAD COLD SHLDR WRAP-ON (PAD) ×2 IMPLANT
PADDING CAST COTTON 6X4 STRL (CAST SUPPLIES) ×4 IMPLANT
PADDING CAST SYN 6 (CAST SUPPLIES) ×1
PADDING CAST SYNTHETIC 6X4 NS (CAST SUPPLIES) IMPLANT
PATELLA ZIMMER 35MM (Orthopedic Implant) ×2 IMPLANT
PIN DRILL HDLS TROCAR 75 4PK (PIN) IMPLANT
PROTECTOR NERVE ULNAR (MISCELLANEOUS) ×2 IMPLANT
SCREW FEMALE HEX FIX 25X2.5 (ORTHOPEDIC DISPOSABLE SUPPLIES) ×4 IMPLANT
SET HNDPC FAN SPRY TIP SCT (DISPOSABLE) ×1 IMPLANT
SET PAD KNEE POSITIONER (MISCELLANEOUS) ×2 IMPLANT
SPIKE FLUID TRANSFER (MISCELLANEOUS) ×1 IMPLANT
STAPLER VISISTAT 35W (STAPLE) ×1 IMPLANT
STEM TIBIA 5 DEG SZ F R KNEE (Knees) IMPLANT
STRIP CLOSURE SKIN 1/2X4 (GAUZE/BANDAGES/DRESSINGS) IMPLANT
SUT MNCRL AB 4-0 PS2 18 (SUTURE) IMPLANT
SUT VIC AB 0 CT1 27 (SUTURE) ×2
SUT VIC AB 0 CT1 27XBRD ANTBC (SUTURE) ×1 IMPLANT
SUT VIC AB 1 CT1 36 (SUTURE) ×4 IMPLANT
SUT VIC AB 2-0 CT1 27 (SUTURE) ×4
SUT VIC AB 2-0 CT1 TAPERPNT 27 (SUTURE) ×2 IMPLANT
TIBIA STEM 5 DEG SZ F R KNEE (Knees) ×2 IMPLANT
TRAY FOLEY MTR SLVR 16FR STAT (SET/KITS/TRAYS/PACK) ×1 IMPLANT
WATER STERILE IRR 1000ML POUR (IV SOLUTION) ×4 IMPLANT
WRAP KNEE MAXI GEL POST OP (GAUZE/BANDAGES/DRESSINGS) ×1 IMPLANT

## 2022-03-17 NOTE — Brief Op Note (Signed)
03/17/2022 ? ?1:38 PM ? ?PATIENT:  Parker Daniel  66 y.o. male ? ?PRE-OPERATIVE DIAGNOSIS:  OSTEOARTHRITIS / DEGENERATIVE JOINT DISEASE RIGHT KNEE ? ?POST-OPERATIVE DIAGNOSIS:  OSTEOARTHRITIS / DEGENERATIVE JOINT DISEASE RIGHT KNEE ? ?PROCEDURE:  Procedure(s): ?RIGHT TOTAL KNEE ARTHROPLASTY (Right) ? ?SURGEON:  Surgeon(s) and Role: ?   Mcarthur Rossetti, MD - Primary ? ?PHYSICIAN ASSISTANT:  Benita Stabile, PA-C ? ?ANESTHESIA:   local, regional, and spinal ? ?COUNTS:  YES ? ?TOURNIQUET:   ?Total Tourniquet Time Documented: ?Thigh (Right) - 78 minutes ?Total: Thigh (Right) - 78 minutes ? ? ?DICTATION: .Other Dictation: Dictation Number [id number] ? ?PLAN OF CARE: Admit for overnight observation ? ?PATIENT DISPOSITION:  PACU - hemodynamically stable. ?  ?Delay start of Pharmacological VTE agent (>24hrs) due to surgical blood loss or risk of bleeding: no ? ?

## 2022-03-17 NOTE — Interval H&P Note (Signed)
History and Physical Interval Note: The patient understands that he is here today for a right total knee replacement to treat his right knee osteoarthritis.  There has been no acute or interval change in his medical status.  See H&P.  The risks and benefits of surgery have been explained in detail and informed consent is obtained. ? ?03/17/2022 ?10:22 AM ? ?Parker Daniel  has presented today for surgery, with the diagnosis of OSTEOARTHRITIS / DEGENERATIVE JOINT DISEASE RIGHT KNEE.  The various methods of treatment have been discussed with the patient and family. After consideration of risks, benefits and other options for treatment, the patient has consented to  Procedure(s): ?RIGHT TOTAL KNEE ARTHROPLASTY (Right) as a surgical intervention.  The patient's history has been reviewed, patient examined, no change in status, stable for surgery.  I have reviewed the patient's chart and labs.  Questions were answered to the patient's satisfaction.   ? ? ?Kathryne Hitch ? ? ?

## 2022-03-17 NOTE — Anesthesia Postprocedure Evaluation (Signed)
Anesthesia Post Note ? ?Patient: Parker Daniel ? ?Procedure(s) Performed: RIGHT TOTAL KNEE ARTHROPLASTY (Right: Knee) ? ?  ? ?Patient location during evaluation: PACU ?Anesthesia Type: Spinal ?Level of consciousness: awake and alert, patient cooperative and oriented ?Pain management: pain level controlled ?Vital Signs Assessment: post-procedure vital signs reviewed and stable ?Respiratory status: spontaneous breathing, nonlabored ventilation and respiratory function stable ?Cardiovascular status: blood pressure returned to baseline and stable ?Postop Assessment: no apparent nausea or vomiting, patient able to bend at knees, spinal receding and adequate PO intake ?Anesthetic complications: no ? ? ?No notable events documented. ? ?Last Vitals:  ?Vitals:  ? 03/17/22 1415 03/17/22 1430  ?BP: 101/60 (!) 102/57  ?Pulse: 63 (!) 58  ?Resp: 18 15  ?Temp:  36.7 ?C  ?SpO2: 97% 97%  ?  ?Last Pain:  ?Vitals:  ? 03/17/22 1430  ?TempSrc:   ?PainSc: 0-No pain  ? ? ?  ?  ?  ?  ?  ?  ? ?[name],E. Delight Bickle ? ? ? ? ?

## 2022-03-17 NOTE — Progress Notes (Signed)
PT Cancellation Note ? ?Patient Details ?Name: Parker Daniel ?MRN: [id number] ?DOB: 1956-04-30 ? ? ?Cancelled Treatment:    Reason Eval/Treat Not Completed: Pain limiting ability to participate, pt reporting 8/10 pain, lying supine in bed grimacing and groaning, requesting hold PT. Will check back as schedule and pt status allows, likely on another day.  ? ?Coolidge Breeze, PT, DPT ?[facility] ?Office: [phone] ?Pager: [phone] ? ?Coolidge Breeze ?03/17/2022, 6:20 PM ?

## 2022-03-17 NOTE — Anesthesia Procedure Notes (Signed)
Anesthesia Regional Block: Adductor canal block  ? ?Pre-Anesthetic Checklist: , timeout performed,  Correct Patient, Correct Site, Correct Laterality,  Correct Procedure, Correct Position, site marked,  Risks and benefits discussed,  Surgical consent,  Pre-op evaluation,  At surgeon's request and post-op pain management ? ?Laterality: Right and Lower ? ?Prep: chloraprep     ?  ?Needles:  ?Injection technique: Single-shot ? ?Needle Type: Echogenic Needle   ? ? ?Needle Length: 9cm  ?Needle Gauge: 21  ? ? ? ?Additional Needles: ? ? ?Procedures:,,,, ultrasound used (permanent image in chart),,    ?Narrative:  ?Start time: 03/17/2022 11:01 AM ?End time: 03/17/2022 11:07 AM ?Injection made incrementally with aspirations every 5 mL. ? ?Performed by: Personally  ?Anesthesiologist: Jairo Ben, MD ? ?Additional Notes: ?Pt identified in Holding room.  Monitors applied. Working IV access confirmed. Sterile prep R thigh.  #21ga ECHOgenic Arrow block needle into adductor canal with US guidance.  20cc 0.75% Ropivacaine injected incrementally after negative test dose.  Patient asymptomatic, VSS, no heme aspirated, tolerated well.   Sandford Craze, MD ? ? ? ? ?

## 2022-03-17 NOTE — Anesthesia Procedure Notes (Signed)
Spinal ? ?Patient location during procedure: OR ?End time: 03/17/2022 11:39 AM ?Reason for block: surgical anesthesia ?Staffing ?Performed: anesthesiologist  ?Anesthesiologist: Jairo Ben, MD ?Resident/CRNA: Orest Dikes, CRNA ?Preanesthetic Checklist ?Completed: patient identified, IV checked, site marked, risks and benefits discussed, surgical consent, monitors and equipment checked, pre-op evaluation and timeout performed ?Spinal Block ?Patient position: sitting ?Prep: DuraPrep and site prepped and draped ?Patient monitoring: blood pressure, continuous pulse ox, cardiac monitor and heart rate ?Approach: midline ?Location: L3-4 ?Injection technique: single-shot ?Needle ?Needle type: Sprotte and Introducer  ?Needle gauge: 24 G ?Needle length: 15 cm ?Assessment ?Events: CSF return and second provider ?Additional Notes ?Pt identified in Operating room.  Monitors applied. Working IV access confirmed. Sterile prep, drape lumbar spine.  CRNA 1% lido local L 3,4.  #24ga Pencan unsuccessful, then repeat local and #24ga long Sprotte into clear CSF L 3,4.  15mg  0.75% Bupivacaine with dextrose injected with asp CSF beginning and end of injection.  Patient asymptomatic, VSS, no heme aspirated, tolerated well.  , MD ?  ? ? ? ?

## 2022-03-17 NOTE — Plan of Care (Signed)
Problem: Education: ?Goal: Knowledge of the prescribed therapeutic regimen will improve ?Outcome: Progressing ?  ?Problem: Clinical Measurements: ?Goal: Postoperative complications will be avoided or minimized ?Outcome: Progressing ?  ?Problem: Pain Management: ?Goal: Pain level will decrease with appropriate interventions ?Outcome: Progressing ?  ?Haydee Salter, RN ?03/17/22 ?8:37 PM ? ?

## 2022-03-17 NOTE — Anesthesia Procedure Notes (Signed)
Procedure Name: Hubbard ?Date/Time: 03/17/2022 11:24 AM ?Performed by: Maxwell Caul, CRNA ?Pre-anesthesia Checklist: Patient identified, Emergency Drugs available, Suction available and Patient being monitored ?Oxygen Delivery Method: Simple face mask ? ? ? ? ?

## 2022-03-17 NOTE — Transfer of Care (Signed)
Immediate Anesthesia Transfer of Care Note ? ?Patient: Parker Daniel ? ?Procedure(s) Performed: RIGHT TOTAL KNEE ARTHROPLASTY (Right: Knee) ? ?Patient Location: PACU ? ?Anesthesia Type:Spinal ? ?Level of Consciousness: awake, alert  and oriented ? ?Airway & Oxygen Therapy: Patient Spontanous Breathing and Patient connected to face mask oxygen ? ?Post-op Assessment: Report given to RN and Post -op Vital signs reviewed and stable ? ?Post vital signs: Reviewed and stable ? ?Last Vitals:  ?Vitals Value Taken Time  ?BP 103/50 03/17/22 1400  ?Temp    ?Pulse 61 03/17/22 1401  ?Resp 15 03/17/22 1401  ?SpO2 94 % 03/17/22 1401  ?Vitals shown include unvalidated device data. ? ?Last Pain:  ?Vitals:  ? 03/17/22 0903  ?TempSrc:   ?PainSc: 8   ?   ? ?Patients Stated Pain Goal: 5 (03/17/22 2440) ? ?Complications: No notable events documented. ?

## 2022-03-17 NOTE — Progress Notes (Signed)
AssistedDr. Carswell Jackson with right, adductor canal block. Side rails up, monitors on throughout procedure. See vital signs in flow sheet. Tolerated Procedure well. ? ?

## 2022-03-17 NOTE — Anesthesia Preprocedure Evaluation (Addendum)
Anesthesia Evaluation  ?Patient identified by MRN, date of birth, ID band ?Patient awake ? ? ? ?Reviewed: ?Allergy & Precautions, NPO status , Patient's Chart, lab work & pertinent test results, reviewed documented beta blocker date and time  ? ?History of Anesthesia Complications ?Negative for: history of anesthetic complications ? ?Airway ?Mallampati: III ? ?TM Distance: >3 FB ?Neck ROM: Full ? ? ? Dental ? ?(+) Dental Advisory Given ?  ?Pulmonary ?shortness of breath,  ?  ?breath sounds clear to auscultation ? ? ? ? ? ? Cardiovascular ?hypertension, Pt. on medications and Pt. on home beta blockers ?+ dysrhythmias Atrial Fibrillation  ?Rhythm:Irregular Rate:Normal ? ? ?  ?Neuro/Psych ?negative neurological ROS ?   ? GI/Hepatic ?Neg liver ROS, GERD  Controlled,  ?Endo/Other  ?Morbid obesity ? Renal/GU ?negative Renal ROS  ? ?  ?Musculoskeletal ? ?(+) Arthritis , Osteoarthritis,   ? Abdominal ?(+) + obese,   ?Peds ? Hematology ?plt 269k ?Eliquis, last dose Monday am   ?Anesthesia Other Findings ? ? Reproductive/Obstetrics ? ?  ? ? ? ? ? ? ? ? ? ? ? ? ? ?  ?  ? ? ? ? ? ? ? ?Anesthesia Physical ?Anesthesia Plan ? ?ASA: 3 ? ?Anesthesia Plan: Spinal  ? ?Post-op Pain Management: Tylenol PO (pre-op)* and Regional block*  ? ?Induction:  ? ?PONV Risk Score and Plan: 1 and Ondansetron and Treatment may vary due to age or medical condition ? ?Airway Management Planned: Natural Airway and Simple Face Mask ? ?Additional Equipment: None ? ?Intra-op Plan:  ? ?Post-operative Plan:  ? ?Informed Consent: I have reviewed the patients History and Physical, chart, labs and discussed the procedure including the risks, benefits and alternatives for the proposed anesthesia with the patient or authorized representative who has indicated his/her understanding and acceptance.  ? ? ? ?Dental advisory given ? ?Plan Discussed with: CRNA and Surgeon ? ?Anesthesia Plan Comments: (Plan routine monitors, SAB with  adductor canal block for post op analgesia)  ? ? ? ? ? ?Anesthesia Quick Evaluation ? ?

## 2022-03-18 DIAGNOSIS — M1711 Unilateral primary osteoarthritis, right knee: Secondary | ICD-10-CM | POA: Diagnosis not present

## 2022-03-18 LAB — CBC
HCT: 34.7 % — ABNORMAL LOW (ref 39.0–52.0)
Hemoglobin: 10.4 g/dL — ABNORMAL LOW (ref 13.0–17.0)
MCH: 28.1 pg (ref 26.0–34.0)
MCHC: 30 g/dL (ref 30.0–36.0)
MCV: 93.8 fL (ref 80.0–100.0)
Platelets: 200 10*3/uL (ref 150–400)
RBC: 3.7 MIL/uL — ABNORMAL LOW (ref 4.22–5.81)
RDW: 13.9 % (ref 11.5–15.5)
WBC: 11.7 10*3/uL — ABNORMAL HIGH (ref 4.0–10.5)
nRBC: 0 % (ref 0.0–0.2)

## 2022-03-18 LAB — BASIC METABOLIC PANEL
Anion gap: 9 (ref 5–15)
BUN: 26 mg/dL — ABNORMAL HIGH (ref 8–23)
CO2: 23 mmol/L (ref 22–32)
Calcium: 8.1 mg/dL — ABNORMAL LOW (ref 8.9–10.3)
Chloride: 105 mmol/L (ref 98–111)
Creatinine, Ser: 1.9 mg/dL — ABNORMAL HIGH (ref 0.61–1.24)
GFR, Estimated: 39 mL/min — ABNORMAL LOW (ref >60)
Glucose, Bld: 128 mg/dL — ABNORMAL HIGH (ref 70–99)
Potassium: 5.3 mmol/L — ABNORMAL HIGH (ref 3.5–5.1)
Sodium: 137 mmol/L (ref 135–145)

## 2022-03-18 MED ORDER — HYDROMORPHONE HCL 4 MG PO TABS: 4.0000 mg | ORAL_TABLET | ORAL | 0 refills | Status: DC | PRN

## 2022-03-18 MED ORDER — TIZANIDINE HCL 4 MG PO TABS
4.0000 mg | ORAL_TABLET | Freq: Four times a day (QID) | ORAL | 0 refills | Status: DC | PRN
Start: 2022-03-18 — End: 2022-03-30

## 2022-03-18 NOTE — Discharge Instructions (Addendum)

## 2022-03-18 NOTE — Evaluation (Signed)
Physical Therapy Evaluation ?Patient Details ?Name: Parker Daniel ?MRN: [id number] ?DOB: 1956/10/16 ?Today's Date: 03/18/2022 ? ?History of Present Illness ? Pt is a 34male s/p R-TKA on 03/17/22. PMH: GERD, gout, HLD, HTN, L-THA 2020.  ?Clinical Impression ? On eval, pt required Min A for mobility. He walked ~25 feet with a RW. Pain rated 9/10 during session. Will plan to follow and progress activity as tolerated.    ?   ? ?Recommendations for follow up therapy are one component of a multi-disciplinary discharge planning process, led by the attending physician.  Recommendations may be updated based on patient status, additional functional criteria and insurance authorization. ? ?Follow Up Recommendations Follow physician's recommendations for discharge plan and follow up therapies ? ?  ?Assistance Recommended at Discharge Frequent or constant Supervision/Assistance  ?Patient can return home with the following ? A little help with walking and/or transfers;A little help with bathing/dressing/bathroom;Assistance with cooking/housework;Help with stairs or ramp for entrance;Assist for transportation ? ?  ?Equipment Recommendations Rolling walker (2 wheels)  ?Recommendations for Other Services ?    ?  ?Functional Status Assessment Patient has had a recent decline in their functional status and demonstrates the ability to make significant improvements in function in a reasonable and predictable amount of time.  ? ?  ?Precautions / Restrictions Precautions ?Precautions: Fall;Knee ?Restrictions ?Weight Bearing Restrictions: No ?Other Position/Activity Restrictions: WBAT  ? ?  ? ?Mobility ? Bed Mobility ?  ?  ?  ?  ?  ?  ?  ?General bed mobility comments: sittingh EOB upon therapist arrival ?  ? ?Transfers ?Overall transfer level: Needs assistance ?Equipment used: Rolling walker (2 wheels) ?Transfers: Sit to/from Stand ?Sit to Stand: Min assist, From elevated surface ?  ?  ?  ?  ?  ?General transfer comment: Cues for safety,  technique, hand/LE placement. Increased time. ?  ? ?Ambulation/Gait ?Ambulation/Gait assistance: Min assist ?Gait Distance (Feet): 25 Feet ?Assistive device: Rolling walker (2 wheels) ?Gait Pattern/deviations: Step-to pattern, Antalgic, Decreased weight shift to right, Decreased stance time - right ?  ?  ?  ?General Gait Details: Cues for safety, technique, sequence, posture, RW proximity. Pt tends to avoid putting weight through R LE. Several brief rest breaks (about every 5 feet). Pt denied dizziness. Distance limited by pain, fatigue. ? ?Stairs ?  ?  ?  ?  ?  ? ?Wheelchair Mobility ?  ? ?Modified Rankin (Stroke Patients Only) ?  ? ?  ? ?Balance Overall balance assessment: Needs assistance ?  ?  ?  ?  ?Standing balance support: Bilateral upper extremity supported, During functional activity, Reliant on assistive device for balance ?Standing balance-Leahy Scale: Poor ?  ?  ?  ?  ?  ?  ?  ?  ?  ?  ?  ?  ?   ? ? ? ?Pertinent Vitals/Pain Pain Assessment ?Pain Assessment: 0-10 ?Pain Score: 9  ?Pain Location: R knee ?Pain Descriptors / Indicators: Discomfort, Sore, Aching, Operative site guarding ?Pain Intervention(s): Limited activity within patient's tolerance, Monitored during session, Repositioned  ? ? ?Home Living Family/patient expects to be discharged to:: Private residence ?Living Arrangements: Spouse/significant other ?Available Help at Discharge: Family ?Type of Home: House ?Home Access: Stairs to enter ?Entrance Stairs-Rails: Left ?Entrance Stairs-Number of Steps: 2 ?  ?Home Layout: One level ?Home Equipment: None ?   ?  ?Prior Function Prior Level of Function : Independent/Modified Independent ?  ?  ?  ?  ?  ?  ?  ?  ?  ? ? ?  Hand Dominance  ?   ? ?  ?Extremity/Trunk Assessment  ? Upper Extremity Assessment ?Upper Extremity Assessment: Overall WFL for tasks assessed ?  ? ?Lower Extremity Assessment ?Lower Extremity Assessment: Generalized weakness ?  ? ?Cervical / Trunk Assessment ?Cervical / Trunk  Assessment: Normal  ?Communication  ? Communication: HOH  ?Cognition Arousal/Alertness: Awake/alert ?Behavior During Therapy: [facility] for tasks assessed/performed ?Overall Cognitive Status: Within Functional Limits for tasks assessed ?  ?  ?  ?  ?  ?  ?  ?  ?  ?  ?  ?  ?  ?  ?  ?  ?  ?  ?  ? ?  ?General Comments   ? ?  ?Exercises    ? ?Assessment/Plan  ?  ?PT Assessment Patient needs continued PT services  ?PT Problem List Decreased strength;Decreased mobility;Decreased range of motion;Decreased activity tolerance;Decreased balance;Decreased knowledge of use of DME;Pain ? ?   ?  ?PT Treatment Interventions DME instruction;Gait training;Therapeutic activities;Therapeutic exercise;Patient/family education;Stair training;Balance training;Functional mobility training   ? ?PT Goals (Current goals can be found in the Care Plan section)  ?Acute Rehab PT Goals ?Patient Stated Goal: less pain ?PT Goal Formulation: With patient/family ?Time For Goal Achievement: 04/01/22 ?Potential to Achieve Goals: Good ? ?  ?Frequency 7X/week ?  ? ? ?Co-evaluation   ?  ?  ?  ?  ? ? ?  ?AM-PAC PT "6 Clicks" Mobility  ?Outcome Measure Help needed turning from your back to your side while in a flat bed without using bedrails?: A Little ?Help needed moving from lying on your back to sitting on the side of a flat bed without using bedrails?: A Little ?Help needed moving to and from a bed to a chair (including a wheelchair)?: A Little ?Help needed standing up from a chair using your arms (e.g., wheelchair or bedside chair)?: A Little ?Help needed to walk in hospital room?: A Little ?Help needed climbing 3-5 steps with a railing? : A Little ?6 Click Score: 18 ? ?  ?End of Session Equipment Utilized During Treatment: Gait belt ?Activity Tolerance: Patient limited by fatigue;Patient limited by pain ?Patient left: in bed;with call bell/phone within reach;with family/visitor present (sitting EOB at pt's request) ?  ?PT Visit Diagnosis: Pain;Other  abnormalities of gait and mobility (R26.89) ?Pain - Right/Left: Right ?Pain - part of body: Knee ?  ? ?Time: 1025-1036 ?PT Time Calculation (min) (ACUTE ONLY): 11 min ? ? ?Charges:   PT Evaluation ?$PT Eval Low Complexity: 1 Low ?  ?  ?   ? ? ? ? ? ?Dionta Larke P, PT ?Acute Rehabilitation  ?Office: [phone] ?Pager: [phone] ? ? ? ?

## 2022-03-18 NOTE — Op Note (Signed)
NAME: Parker Daniel, Parker Daniel. ?MEDICAL RECORD NO: [id number] ?ACCOUNT NO: [id number] ?DATE OF BIRTH: Oct 04, 1956 ?FACILITY: [facility] ?LOCATION: [facility] ?PHYSICIAN: Lind Guest. Ninfa Linden, MD ? ?Operative Report  ? ?DATE OF PROCEDURE: 03/17/2022 ? ?PREOPERATIVE DIAGNOSES:  Primary osteoarthritis and degenerative joint disease, right knee. ? ?POSTOPERATIVE DIAGNOSES:  Primary osteoarthritis and degenerative joint disease, right knee. ? ?PROCEDURE:  Right total knee arthroplasty. ? ?IMPLANTS:  Biomet/Zimmer cemented Persona knee system with size 8 right standard CR femur, size F right tibial tray, right 12 mm thickness medial congruent fixed bearing polyethylene insert, 35 mm patellar button. ? ?SURGEON:  Lind Guest. Ninfa Linden, MD ? ?ASSISTANT:  Benita Stabile, PA-C ? ?ANESTHESIA:  ?1.  Right lower extremity adductor canal block. ?2.  Spinal. ?3.  Local with 0.25% Marcaine with epinephrine around the arthrotomy. ? ?TOURNIQUET TIME:  Just over 1 hour. ? ?BLOOD LOSS:  Less than 100 mL. ? ?ANTIBIOTICS:  2 g IV Ancef. ? ?COMPLICATIONS:  None. ? ?INDICATIONS:  The patient is a 66 year old gentleman I have known for several years now.  He has well-documented debilitating arthritis involving his right knee with severe varus malalignment.  His x-rays showed bone-on-bone wear of all three  ?compartments.  At this point, it has gotten to where his knee pain is detrimentally affecting his mobility, his quality of life and his activities of daily living on a daily basis and is very tender to him.  He has tried and failed all forms of  ?conservative treatment.  At this point, he does wish to proceed with total knee arthroplasty.  We talked in length and detail about how this will be a tougher surgery given the severity of his deformity.  We talked about the risk of acute blood loss  ?anemia, nerve or vessel injury, fracture, infection, DVT, implant failure and skin and soft tissue issues.  We talked about our goals being decrease pain, improve mobility  and overall improve quality of life. ? ?DESCRIPTION OF PROCEDURE:  After informed consent was obtained, appropriate right knee was marked.  Anesthesia obtained an adductor canal block in the holding room to the right lower extremity.  He was then brought to the operating room and sat up on the ? operating table where spinal anesthesia was obtained.  He was laid in the supine position on the operating table.  Foley catheter was placed and a nonsterile tourniquet was placed on his upper right thigh.  He had a significant flexion contracture and  ?varus malalignment of that knee.  His right thigh, knee, leg, ankle and foot were prepped and draped with DuraPrep and sterile drapes including a sterile stockinette.  A timeout was called and he was identified correct patient, correct right knee.  I  ?then made an incision over the patella and carried this proximally and distally.  I dissected down the knee joint, carried out a medial parapatellar arthrotomy, finding a very large joint effusion.  There was significant wear of cartilage in all three  ?compartments and large osteophytes in all three compartments that we had to remove.  We removed remnants of ACL, PCL, medial and lateral meniscus.  Using extramedullary cutting guide, we set our cutting guide for taking 10 mm off the high side.  We  ?actually backed this down 2 more millimeters.  We did a 3-degree slope.  We then did our distal femoral cutting guide based off the epicondylar axis and Whiteside's line.  This was with an intramedullary alignment guide, setting this for a right knee at  ?  5 degrees externally rotated.  We made this cut for a 10 mm cut and then backed it down 2 more millimeters.  With a 10 mm extension block, we finally achieved full extension.  We then went back to the femur and put our femoral sizing guide based off  ?Whiteside's line and epicondylar axis.  Based off of this, we chose a size 8 femur.  We put a 4-in-1 cutting block for a size 8  femur, made our anterior and posterior cuts, followed by our chamfer cuts.  For the tibia, we chose a size F tibial tray  ?setting the rotation off the tibial tubercle and the femur.  We made our keel punch off of this and our drill hole.  With a size right F trial tibia, we trialed a size 8 right CR femur.  We went with a 10 and then 11 mm medial congruent fixed bearing  ?polyethylene insert trial and we were pleased with range of motion and stability with that trial.  We then made our patellar cut and drilled three holes for size 29 patellar button.  With all trial instrumentation in the knee, we put him through several  ?cycles of motion.  We were pleased with range of motion and stability.  We then removed all trial instrumentation from the knee and irrigated the knee with normal saline solution.  We placed our Marcaine with epinephrine around the arthrotomy.  We then  ?mixed our cement with the knee in a flexed position, cemented our size right F tibia tray followed by our standard right CR size 8 standard femur.  We removed excess cement debris from the knee and placed our medial congruent right 12 mm thickness  ?polyethylene insert, which was fixed bearing.  We then cemented our size 35 patellar button.  We then held the knee completely compressed and extended to allow the cement to harden.  Once it hardened, we were pleased with range of motion and stability.   ?We then let the tourniquet down.  Hemostasis was obtained with electrocautery.  We then closed the arthrotomy with interrupted #1 Vicryl suture followed by 0 Vicryl to close the deep tissue and 2-0 Vicryl to close the subcutaneous tissue.  The skin was  ?closed with staples.  A well-padded sterile dressing was applied.  He was taken to recovery room in stable condition with all final counts being correct.  No complications noted.  Of note, Benita Stabile, PA-C did assist during the entire case from beginning  ?to end.  His assistance was crucial for  management of soft tissues, retracting soft tissues as well as helping guide implant placement.  He was involved with the layered closure of the wound.  His involvement in the case was medically necessary. ? ? ?SHW ?Daniel: 03/17/2022 1:37:37 pm T: 03/18/2022 1:34:00 am  ?JOB: [id number]  ?

## 2022-03-18 NOTE — Plan of Care (Signed)
  Problem: Education: Goal: Knowledge of the prescribed therapeutic regimen will improve Outcome: Progressing   Problem: Activity: Goal: Ability to avoid complications of mobility impairment will improve Outcome: Progressing   Problem: Pain Management: Goal: Pain level will decrease with appropriate interventions Outcome: Progressing   

## 2022-03-18 NOTE — Progress Notes (Signed)
Physical Therapy Treatment ?Patient Details ?Name: Parker Daniel ?MRN: [id number] ?DOB: 1956/08/20 ?Today's Date: 03/18/2022 ? ? ?History of Present Illness Pt is a s/p R-TKA on 03/17/22. PMH: GERD, gout, HLD, HTN, L-THA 2020. ? ?  ?PT Comments  ? ? Progressing slowly. Mobility is limited by pain. Cues for pt to try to increase WBing through R LE. Pt rated pain 9/10 during session.    ?Recommendations for follow up therapy are one component of a multi-disciplinary discharge planning process, led by the attending physician.  Recommendations may be updated based on patient status, additional functional criteria and insurance authorization. ? ?Follow Up Recommendations ? Follow physician's recommendations for discharge plan and follow up therapies ?  ?  ?Assistance Recommended at Discharge Frequent or constant Supervision/Assistance  ?Patient can return home with the following A little help with walking and/or transfers;A little help with bathing/dressing/bathroom;Assistance with cooking/housework;Help with stairs or ramp for entrance;Assist for transportation ?  ?Equipment Recommendations ? Rolling walker (2 wheels)  ?  ?Recommendations for Other Services   ? ? ?  ?Precautions / Restrictions Precautions ?Precautions: Fall;Knee ?Restrictions ?Weight Bearing Restrictions: No ?Other Position/Activity Restrictions: WBAT  ?  ? ?Mobility ? Bed Mobility ?  ?  ?  ?  ?  ?  ?  ?General bed mobility comments: sittingh EOB upon therapist arrival ?  ? ?Transfers ?Overall transfer level: Needs assistance ?Equipment used: Rolling walker (2 wheels) ?Transfers: Sit to/from Stand ?Sit to Stand: Min assist, From elevated surface ?  ?  ?  ?  ?  ?General transfer comment: Cues for safety, technique, hand/LE placement. Increased time. ?  ? ?Ambulation/Gait ?Ambulation/Gait assistance: Min assist ?Gait Distance (Feet): 35 Feet ?Assistive device: Rolling walker (2 wheels) ?Gait Pattern/deviations: Step-to pattern, Antalgic, Decreased weight  shift to right, Decreased stance time - right ?  ?  ?  ?General Gait Details: Cues for safety, technique, sequence, posture, RW proximity. Pt tends to avoid putting weight through R LE.  Distance limited by pain. ? ? ?Stairs ?  ?  ?  ?  ?  ? ? ?Wheelchair Mobility ?  ? ?Modified Rankin (Stroke Patients Only) ?  ? ? ?  ?Balance Overall balance assessment: Needs assistance ?  ?  ?  ?  ?Standing balance support: Bilateral upper extremity supported, During functional activity, Reliant on assistive device for balance ?Standing balance-Leahy Scale: Poor ?  ?  ?  ?  ?  ?  ?  ?  ?  ?  ?  ?  ?  ? ?  ?Cognition Arousal/Alertness: Awake/alert ?Behavior During Therapy: [facility] for tasks assessed/performed ?Overall Cognitive Status: Within Functional Limits for tasks assessed ?  ?  ?  ?  ?  ?  ?  ?  ?  ?  ?  ?  ?  ?  ?  ?  ?  ?  ?  ? ?  ?Exercises Total Joint Exercises ?Ankle Circles/Pumps: AROM, Both, 10 reps, Seated ?Knee Flexion: AAROM, Right, 10 reps, Seated ?Goniometric ROM: ~10-60 degrees ? ?  ?General Comments   ?  ?  ? ?Pertinent Vitals/Pain Pain Assessment ?Pain Assessment: 0-10 ?Pain Score: 9  ?Pain Location: R knee ?Pain Descriptors / Indicators: Discomfort, Sore, Aching, Operative site guarding ?Pain Intervention(s): Limited activity within patient's tolerance, Monitored during session, Repositioned  ? ? ?Home Living Family/patient expects to be discharged to:: Private residence ?Living Arrangements: Spouse/significant other ?Available Help at Discharge: Family ?Type of Home: House ?Home Access: Stairs to enter ?Entrance  Stairs-Rails: Left ?Entrance Stairs-Number of Steps: 2 ?  ?Home Layout: One level ?Home Equipment: None ?   ?  ?Prior Function    ?  ?  ?   ? ?PT Goals (current goals can now be found in the care plan section) Acute Rehab PT Goals ?Patient Stated Goal: less pain ?PT Goal Formulation: With patient/family ?Time For Goal Achievement: 04/01/22 ?Potential to Achieve Goals: Good ?Progress towards PT goals:  Progressing toward goals ? ?  ?Frequency ? ? ? 7X/week ? ? ? ?  ?PT Plan Current plan remains appropriate  ? ? ?Co-evaluation   ?  ?  ?  ?  ? ?  ?AM-PAC PT "6 Clicks" Mobility   ?Outcome Measure ? Help needed turning from your back to your side while in a flat bed without using bedrails?: A Little ?Help needed moving from lying on your back to sitting on the side of a flat bed without using bedrails?: A Little ?Help needed moving to and from a bed to a chair (including a wheelchair)?: A Little ?Help needed standing up from a chair using your arms (e.g., wheelchair or bedside chair)?: A Little ?Help needed to walk in hospital room?: A Little ?Help needed climbing 3-5 steps with a railing? : A Lot ?6 Click Score: 17 ? ?  ?End of Session Equipment Utilized During Treatment: Gait belt ?Activity Tolerance: Patient limited by pain ?Patient left: in bed;with call bell/phone within reach;with family/visitor present (sitting EOB-pt request) ?  ?PT Visit Diagnosis: Pain;Other abnormalities of gait and mobility (R26.89) ?Pain - Right/Left: Right ?Pain - part of body: Knee ?  ? ? ?Time: 1450-1501 ?PT Time Calculation (min) (ACUTE ONLY): 11 min ? ?Charges:  $Gait Training: 8-22 mins          ?          ? ? ? ? ? ?Faye Ramsay, PT ?Acute Rehabilitation  ?Office: [phone] ?Pager: [phone] ? ?  ? ?

## 2022-03-18 NOTE — Progress Notes (Signed)
Subjective: ?1 Day Post-Op Procedure(s) (LRB): ?RIGHT TOTAL KNEE ARTHROPLASTY (Right) ?Patient reports pain as severe (is on chronic pain meds pre-op). ? ?Objective: ?Vital signs in last 24 hours: ?Temp:  [98.1 ?F (36.7 ?C)-98.6 ?F (37 ?C)] 98.3 ?F (36.8 ?C) (05/06 3532) ?Pulse Rate:  [52-84] 83 (05/06 0916) ?Resp:  [15-20] 20 (05/06 0916) ?BP: (97-152)/(50-84) 107/71 (05/06 0916) ?SpO2:  [93 %-100 %] 94 % (05/06 0916) ? ?Intake/Output from previous day: ?05/05 0701 - 05/06 0700 ?In: 2060 [P.O.:360; I.V.:1500; IV Piggyback:200] ?Out: 875 [Urine:825; Blood:50] ?Intake/Output this shift: ?Total I/O ?In: 120 [P.O.:120] ?Out: 25 [Urine:25] ? ?Recent Labs  ?  03/18/22 ?0333  ?HGB 10.4*  ? ?Recent Labs  ?  03/18/22 ?0333  ?WBC 11.7*  ?RBC 3.70*  ?HCT 34.7*  ?PLT 200  ? ?Recent Labs  ?  03/18/22 ?0333  ?NA 137  ?K 5.3*  ?CL 105  ?CO2 23  ?BUN 26*  ?CREATININE 1.90*  ?GLUCOSE 128*  ?CALCIUM 8.1*  ? ?No results for input(s): LABPT, INR in the last 72 hours. ? ?Sensation intact distally ?Intact pulses distally ?Dorsiflexion/Plantar flexion intact ?Incision: dressing C/D/I ? ? ?Assessment/Plan: ?1 Day Post-Op Procedure(s) (LRB): ?RIGHT TOTAL KNEE ARTHROPLASTY (Right) ?Up with therapy ?Plan for discharge tomorrow ?Discharge home with home health ? ? ? ? ? ?Kathryne Hitch ?03/18/2022, 10:26 AM ? ?

## 2022-03-19 ENCOUNTER — Observation Stay (HOSPITAL_BASED_OUTPATIENT_CLINIC_OR_DEPARTMENT_OTHER): Payer: Medicare HMO

## 2022-03-19 DIAGNOSIS — M7989 Other specified soft tissue disorders: Secondary | ICD-10-CM | POA: Diagnosis not present

## 2022-03-19 DIAGNOSIS — M1711 Unilateral primary osteoarthritis, right knee: Secondary | ICD-10-CM | POA: Diagnosis not present

## 2022-03-19 MED ORDER — CHLORHEXIDINE GLUCONATE CLOTH 2 % EX PADS
6.0000 | MEDICATED_PAD | Freq: Every day | CUTANEOUS | Status: DC
  Administered 2022-03-19: 6 via TOPICAL

## 2022-03-19 NOTE — TOC Transition Note (Deleted)
Transition of Care (TOC) - CM/SW Discharge Note ? ? ?Patient Details  ?Name: Parker Daniel ?MRN: [id number] ?Date of Birth: 16-Aug-1956 ? ?Transition of Care (TOC) CM/SW Contact:  ?Darleene Cleaver, LCSW ?Phone Number: ?03/19/2022, 3:06 PM ? ? ?Clinical Narrative:    ? ?Patient has home health prearranged with [facility].  CSW signing off, patient plans to discharge back home with home health PT.   ? ? ?  ?  ? ? ?Patient Goals and CMS Choice ?  ?  ?  ? ?Discharge Placement ?  ?           ?  ?  ?  ?  ? ?Discharge Plan and Services ?  ?  ?           ?  ?  ?  ?  ?  ?  ?  ?  ?  ?  ? ?Social Determinants of Health (SDOH) Interventions ?  ? ? ?Readmission Risk Interventions ?   ? View : No data to display.  ?  ?  ?  ? ? ? ? ? ?

## 2022-03-19 NOTE — TOC Transition Note (Addendum)
Transition of Care (TOC) - CM/SW Discharge Note ? ? ?Patient Details  ?Name: NATAN HARTOG ?MRN: [id number] ?Date of Birth: 1956/07/27 ? ?Transition of Care (TOC) CM/SW Contact:  ?Karl Erway, LCSW ?Phone Number: ?03/20/2022, 9:15 AM ? ? ?Clinical Narrative:    ? ?Patient has home health prearranged with [facility].  CSW signing off, patient plans to discharge back home with home health PT.  RW needed/ no ageny pref/ order place with [employer] for delivery to room today. ? ?Final next level of care: Home w Home Health Services ?Barriers to Discharge: Continued Medical Work up ? ? ?Patient Goals and CMS Choice ?Patient states their goals for this hospitalization and ongoing recovery are:: To return back home with home health. ?  ?  ? ?Discharge Placement ? Home with home health. ?           ?  ?  ?  ?  ? ?Discharge Plan and Services ?  ?  ?           ?DME Arranged: Walker rolling ?DME Agency: [facility] ?Date DME Agency Contacted: 03/20/22 ?Time DME Agency Contacted: 0915 ?Representative spoke with at DME Agency: Duwayne Heck ?  ?HH Agency: [facility] ?Date HH Agency Contacted: 03/13/22 ?Time HH Agency Contacted: [phone] ?Representative spoke with at [facility] Agency: Monia Pouch ? ?Social Determinants of Health (SDOH) Interventions ?  ? ? ?Readmission Risk Interventions ?   ? View : No data to display.  ?  ?  ?  ? ? ? ? ? ?

## 2022-03-19 NOTE — Consult Note (Signed)
I have been asked to see the patient by Dr. Vira BrownsJames Nitka, for evaluation and management of urinary retention. ? ?History of present illness: ?66 year old male with a history of BPH and voiding issues including urinary retention in the past presents the hospital Friday morning for a right total knee replacement.  His catheter was removed moved yesterday morning and since that time the patient has had difficulty with urination.  He has required clean intermittent catheterization twice.  The second time that he was cath he had some blood clots per report.  He has been voiding very frequent small amounts his most recent PVR was greater than 300. ? ?The patient underwent a greenlight laser surgery in 2016 for BPH.  Prior to that he was in urinary retention.  He subsequently developed a bladder neck contracture and in March underwent a bladder neck incision with Dr. Mena GoesEskridge.  Leading up to the procedure on Friday he was voiding reasonably well.  He does take tamsulosin baseline.  The patient also has a history of nephrolithiasis and was treated for that in February. ? ?Review of systems: A 12 point comprehensive review of systems was obtained and is negative unless otherwise stated in the history of present illness. ? ?Patient Active Problem List  ? Diagnosis Date Noted  ? Status post total right knee replacement 03/17/2022  ? Unilateral primary osteoarthritis, right knee 08/07/2019  ? History of arthroplasty of left hip 06/28/2019  ? 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 Unilateral primary osteoarthritis, left hip 04/22/2019  ? ? ?No current facility-administered medications on file prior to encounter.  ? ?Current Outpatient Medications on File Prior to Encounter  ?Medication Sig Dispense Refill  ? acetaminophen (TYLENOL) 500 MG tablet Take 1,000 mg by mouth every 8 (eight) hours as needed for moderate pain.    ? allopurinol (ZYLOPRIM) 300 MG tablet Take 300 mg by mouth daily at 6 PM. Pt takes at 6 pm  at nite    ? Calcium Carb-Cholecalciferol 500-10 MG-MCG TABS Take 1 tablet by mouth daily.    ? Cholecalciferol (VITAMIN D) 50 MCG (2000 UT) CAPS Take 4,000 Units by mouth daily.    ? ELDERBERRY PO Take 2 tablets by mouth daily. Vitamin C & Zinc    ? ELIQUIS 5 MG TABS tablet Take 1 tablet (5 mg total) by mouth 2 (two) times daily. 60 tablet   ? gabapentin (NEURONTIN) 300 MG capsule Take 600 mg by mouth 3 (three) times daily.    ? ibuprofen (ADVIL) 200 MG tablet Take 400 mg by mouth every 8 (eight) hours as needed for moderate pain.    ? metoprolol succinate (TOPROL-XL) 25 MG 24 hr tablet Take 25 mg by mouth in the morning.    ? MULTAQ 400 MG tablet Take 400 mg by mouth 2 (two) times daily. Pt takes at 6pm and 6 am    ? Multiple Vitamins-Minerals (MULTIVITAMIN ADULT) CHEW Chew 1 each by mouth daily. Sugar Free    ? Omega-3 Fatty Acids (OMEGA-3 PLUS PO) Take 2 tablets by mouth daily. Max    ? oxyCODONE (ROXICODONE) 15 MG immediate release tablet Take 15 mg by mouth 2 (two) times daily as needed for pain.    ? pravastatin (PRAVACHOL) 40 MG tablet Take 40 mg by mouth at bedtime.    ? senna (SENOKOT) 8.6 MG tablet Take 2-3 tablets by mouth daily as needed for constipation.    ? tamsulosin (FLOMAX) 0.4 MG CAPS capsule Take 0.4 mg  by mouth at bedtime. 9 pm    ? testosterone cypionate (DEPOTESTOSTERONE CYPIONATE) 200 MG/ML injection Inject 200 mg into the muscle every 21 ( twenty-one) days.    ? traZODone (DESYREL) 50 MG tablet Take 50 mg by mouth at bedtime.    ? vitamin C (ASCORBIC ACID) 250 MG tablet Take 500 mg by mouth daily.    ? XTAMPZA ER 18 MG C12A Take 18 mg by mouth every 12 (twelve) hours. Pt takes at 6pm and 6 am    ? ? ?Past Medical History:  ?Diagnosis Date  ? Arthritis   ? Dyspnea   ? with exertion  ? GERD (gastroesophageal reflux disease)   ? Gout   ? History of kidney stones   ? Hyperlipidemia   ? Hypertension   ? Pneumonia   ? 10/2021  ? ? ?Past Surgical History:  ?Procedure Laterality Date  ?  APPENDECTOMY    ? age 89  ? CHOLECYSTECTOMY    ? age 44  ? CYSTOSCOPY/URETEROSCOPY/HOLMIUM LASER/STENT PLACEMENT Right 01/24/2022  ? Procedure: CYSTOSCOPY/RETROGRADE/URETEROSCOPY/HOLMIUM LASER/STENT PLACEMENT;  Surgeon: Jerilee Field, MD;  Location: [facility];  Service: Urology;  Laterality: Right;  ? KNEE SURGERY    ? SURGERY SCROTAL / TESTICULAR    ? age 15  ? THULIUM LASER TURP (TRANSURETHRAL RESECTION OF PROSTATE) N/A 01/24/2022  ? Procedure: THULIUM LASER TURP (TRANSURETHRAL RESECTION OF PROSTATE);  Surgeon: Jerilee Field, MD;  Location: [facility];  Service: Urology;  Laterality: N/A;  ? TONSILLECTOMY    ? TOTAL HIP ARTHROPLASTY Left 06/27/2019  ? Procedure: LEFT TOTAL HIP ARTHROPLASTY ANTERIOR APPROACH;  Surgeon: Kathryne Hitch, MD;  Location: [facility];  Service: Orthopedics;  Laterality: Left;  ? ? ?Social History  ? ?Tobacco Use  ? Smoking status: Never  ? Smokeless tobacco: Never  ?Vaping Use  ? Vaping Use: Never used  ?Substance Use Topics  ? Alcohol use: Yes  ?  Comment: rare  ? Drug use: Never  ? ? ?Family History  ?Problem Relation Age of Onset  ? Cancer Father   ?     bladder  ? ? ?PE: ?Vitals:  ? 03/18/22 0916 03/18/22 1900 03/18/22 2043 03/19/22 0540  ?BP: 107/71 (!) 155/75 (!) 159/74 (!) 145/74  ?Pulse: 83 84 93 80  ?Resp: 20 17 18 18   ?Temp: 98.3 ?F (36.8 ?C) 98.2 ?F (36.8 ?C) 99.2 ?F (37.3 ?C) 98.6 ?F (37 ?C)  ?TempSrc: Oral Oral Oral Oral  ?SpO2: 94% 94% 94% 97%  ?Weight:      ?Height:      ? ?Patient appears to be in no acute distress  ?patient is alert and oriented x3 ?Atraumatic normocephalic head ?No cervical or supraclavicular lymphadenopathy appreciated ?No increased work of breathing, no audible wheezes/rhonchi ?Regular sinus rhythm/rate ?Abdomen is soft, nontender, nondistended, no CVA or suprapubic tenderness ?Right lower extremity with some edema and ecchymosis secondary to his recent surgery ?Grossly neurologically intact ?No identifiable skin lesions ? ?Recent Labs  ?   03/18/22 ?0333  ?WBC 11.7*  ?HGB 10.4*  ?HCT 34.7*  ? ?Recent Labs  ?  03/18/22 ?0333  ?NA 137  ?K 5.3*  ?CL 105  ?CO2 23  ?GLUCOSE 128*  ?BUN 26*  ?CREATININE 1.90*  ?CALCIUM 8.1*  ? ?No results for input(s): LABPT, INR in the last 72 hours. ?No results for input(s): LABURIN in the last 72 hours. ?Results for orders placed or performed during the hospital encounter of 03/08/22  ?Surgical pcr screen     Status:  Abnormal  ? Collection Time: 03/08/22  1:30 PM  ? Specimen: Nasal Mucosa; Nasal Swab  ?Result Value Ref Range Status  ? MRSA, PCR NEGATIVE NEGATIVE Final  ? Staphylococcus aureus POSITIVE (A) NEGATIVE Final  ?  Comment: (NOTE) ?The Xpert SA Assay (FDA approved for NASAL specimens in patients 68 ?years of age and older), is one component of a comprehensive ?surveillance program. It is not intended to diagnose infection nor to ?guide or monitor treatment. ?Performed at [facility], [address] ?[place], [place] [address] ?  ? ? ?Imaging: ?none ? ?Imp/ Recommendations: 66 year old male status post right knee replacement with incomplete bladder emptying in the acute postop period.  This is likely multifactorial including some bladder dysfunction, narcotics, constipation, and immobility.  Given that there was some difficulty cathing including hematuria, I recommended that a Foley catheter be placed in the short term so the patient may be discharged home.  We discussed aggressive bowel regimen as well as continue with the tamsulosin.  We will schedule him for a voiding trial in the mid to late part of next week.  I have written an order for the Foley catheter be placed by the nursing staff and for him to be discharged with it.  We have opted not to place him on antibiotics given that there does not appear to be any infection related to his urinary retention. ? ? ? ?Crist Fat ?  ?

## 2022-03-19 NOTE — Progress Notes (Signed)
Pt's IV site leaking when pushing pt's IV pain med and IV d/c'ed. Explained to pt that w no IV access that he would need to be back on po pain meds (bc his d/c is expected tomorrow). Pt in agreement w this. ?

## 2022-03-19 NOTE — Progress Notes (Addendum)
Physical Therapy Treatment ?Patient Details ?Name: Parker Daniel ?MRN: [id number] ?DOB: 02/15/1956 ?Today's Date: 03/19/2022 ? ? ?History of Present Illness Pt is a 78male s/p R-TKA on 03/17/22. PMH: GERD, gout, HLD, HTN, L-THA 2020. ? ?  ?PT Comments  ? ? POD # 2 pm session ?Assisted with amb in hallway an increased distance.  General Gait Details: 25% VC's on proper walker to self distance and upright posture.  tolerated an increased distance.  Recliner following for safety.  Then returned to room to perform some TE's following HEP handout.  Instructed on proper tech, freq as well as use of ICE.   ?Pt staying one more night for pain control plus pt not voiding so now has a cath and Urology Consult.   Will practice stairs in morning.    ?Recommendations for follow up therapy are one component of a multi-disciplinary discharge planning process, led by the attending physician.  Recommendations may be updated based on patient status, additional functional criteria and insurance authorization. ? ?Follow Up Recommendations ? Follow physician's recommendations for discharge plan and follow up therapies ?  ?  ?Assistance Recommended at Discharge Frequent or constant Supervision/Assistance  ?Patient can return home with the following A little help with walking and/or transfers;A little help with bathing/dressing/bathroom;Assistance with cooking/housework;Help with stairs or ramp for entrance;Assist for transportation ?  ?Equipment Recommendations ? Rolling walker (2 wheels)  ?  ?Recommendations for Other Services   ? ? ?  ?Precautions / Restrictions Precautions ?Precautions: Fall;Knee ?Precaution Comments: instructed no pillow under knee ?Restrictions ?Weight Bearing Restrictions: No ?RLE Weight Bearing: Weight bearing as tolerated  ?  ? ?Mobility ? Bed Mobility ?Overal bed mobility: Needs Assistance ?Bed Mobility: Supine to Sit ?  ?  ?Supine to sit: Min guard ?  ?  ?General bed mobility comments: pt OOB in recliner ?   ? ?Transfers ?Overall transfer level: Needs assistance ?Equipment used: Rolling walker (2 wheels) ?Transfers: Sit to/from Stand ?Sit to Stand: Min assist, From elevated surface, Min guard ?  ?  ?  ?  ?  ?General transfer comment: 25% VC's on proper hand placement and safety with turns.  Also requires increased time. ?  ? ?Ambulation/Gait ?Ambulation/Gait assistance: Min guard, Supervision ?Gait Distance (Feet): 58 Feet ?Assistive device: Rolling walker (2 wheels) ?Gait Pattern/deviations: Step-to pattern, Antalgic, Decreased weight shift to right, Decreased stance time - right ?Gait velocity: decreased ?  ?  ?General Gait Details: 25% VC's on proper walker to self distance and upright posture.  tolerated an increased distance.  Recliner following for safety. ? ? ?Stairs ?  ?  ?  ?  ?  ? ? ?Wheelchair Mobility ?  ? ?Modified Rankin (Stroke Patients Only) ?  ? ? ?  ?Balance   ?  ?  ?  ?  ?  ?  ?  ?  ?  ?  ?  ?  ?  ?  ?  ?  ?  ?  ?  ? ?  ?Cognition Arousal/Alertness: Awake/alert ?Behavior During Therapy: [facility] for tasks assessed/performed ?Overall Cognitive Status: Within Functional Limits for tasks assessed ?  ?  ?  ?  ?  ?  ?  ?  ?  ?  ?  ?  ?  ?  ?  ?  ?General Comments: AxO x 3 motivated ?  ?  ? ?  ?Exercises  Total Knee Replacement TE's following HEP handout ?10 reps B LE ankle pumps ?05 reps towel squeezes ?05 reps knee  presses ?05 reps heel slides  ?05 reps SAQ's ?05 reps SLR's ?05 reps ABD ?Educated on use of gait belt to assist with TE's ?Followed by ICE ? ? ?  ?General Comments   ?  ?  ? ?Pertinent Vitals/Pain Pain Assessment ?Pain Assessment: 0-10 ?Pain Location: R knee ?Pain Descriptors / Indicators: Discomfort, Sore, Aching, Operative site guarding ?Pain Intervention(s): Limited activity within patient's tolerance, Monitored during session, Repositioned, Ice applied  ? ? ?Home Living   ?  ?  ?  ?  ?  ?  ?  ?  ?  ?   ?  ?Prior Function    ?  ?  ?   ? ?PT Goals (current goals can now be found in the care plan  section) Progress towards PT goals: Progressing toward goals ? ?  ?Frequency ? ? ? 7X/week ? ? ? ?  ?PT Plan Current plan remains appropriate  ? ? ?Co-evaluation   ?  ?  ?  ?  ? ?  ?AM-PAC PT "6 Clicks" Mobility   ?Outcome Measure ? Help needed turning from your back to your side while in a flat bed without using bedrails?: A Little ?Help needed moving from lying on your back to sitting on the side of a flat bed without using bedrails?: A Little ?Help needed moving to and from a bed to a chair (including a wheelchair)?: A Little ?Help needed standing up from a chair using your arms (e.g., wheelchair or bedside chair)?: A Little ?Help needed to walk in hospital room?: A Little ?Help needed climbing 3-5 steps with a railing? : A Lot ?6 Click Score: 17 ? ?  ?End of Session Equipment Utilized During Treatment: Gait belt ?Activity Tolerance: Patient limited by pain ?Patient left: in chair;with call bell/phone within reach;with chair alarm set ?Nurse Communication: Mobility status ?PT Visit Diagnosis: Pain;Other abnormalities of gait and mobility (R26.89) ?Pain - Right/Left: Right ?Pain - part of body: Knee ?  ? ? ?Time: [id number] ?PT Time Calculation (min) (ACUTE ONLY): 20 min ? ?Charges:  $Gait Training: 8-22 mins ?$Therapeutic Exercise: 8-22 mins          ?          ? ?Rica Koyanagi  PTA ?Acute  Rehabilitation Services ?Pager      [phone] ?Office      [phone] ? ? ?

## 2022-03-19 NOTE — Progress Notes (Signed)
Lower extremity venous RT study completed.   Please see CV Proc for preliminary results.   Brodi Nery, RDMS, RVT  

## 2022-03-19 NOTE — Progress Notes (Signed)
Physical Therapy Treatment ?Patient Details ?Name: Parker Daniel ?MRN: [id number] ?DOB: [birth year] ?Today's Date: 03/19/2022 ? ? ?History of Present Illness Pt is a 17male s/p R-TKA on 03/17/22. PMH: GERD, gout, HLD, HTN, L-THA 2020. ? ?  ?PT Comments  ? ? POD # 2 am session ?AxO x 3 having issues with pain control.  Assisted OOB required increased time and effort.  General bed mobility comments: demonstrated and instructed how to use belt to assist LE off bed. General transfer comment: 25% VC's on proper hand placement and safety with turns.General Gait Details: 25% VC's on proper walker to self distance and upright posture.  tolerated an increased distance.  Recliner following for safety. Returned to room in recliner.  Performed a few TE's followed by ICE.   ?Pt hopes to D/C to home tomorrow.  Will see again this afternoon.    ?Recommendations for follow up therapy are one component of a multi-disciplinary discharge planning process, led by the attending physician.  Recommendations may be updated based on patient status, additional functional criteria and insurance authorization. ? ?Follow Up Recommendations ? Follow physician's recommendations for discharge plan and follow up therapies ?  ?  ?Assistance Recommended at Discharge Frequent or constant Supervision/Assistance  ?Patient can return home with the following A little help with walking and/or transfers;A little help with bathing/dressing/bathroom;Assistance with cooking/housework;Help with stairs or ramp for entrance;Assist for transportation ?  ?Equipment Recommendations ? Rolling walker (2 wheels)  ?  ?Recommendations for Other Services   ? ? ?  ?Precautions / Restrictions Precautions ?Precautions: Fall;Knee ?Precaution Comments: instructed no pillow under knee ?Restrictions ?Weight Bearing Restrictions: No ?RLE Weight Bearing: Weight bearing as tolerated  ?  ? ?Mobility ? Bed Mobility ?Overal bed mobility: Needs Assistance ?Bed Mobility: Supine to Sit ?  ?   ?Supine to sit: Min guard ?  ?  ?General bed mobility comments: demonstrated and instructed how to use belt to assist LE off bed. ?  ? ?Transfers ?Overall transfer level: Needs assistance ?Equipment used: Rolling walker (2 wheels) ?Transfers: Sit to/from Stand ?Sit to Stand: Min assist, From elevated surface, Min guard ?  ?  ?  ?  ?  ?General transfer comment: 255 VC's on proper hand placement and safety with turns. ?  ? ?Ambulation/Gait ?Ambulation/Gait assistance: Min guard, Supervision ?Gait Distance (Feet): 48 Feet ?Assistive device: Rolling walker (2 wheels) ?Gait Pattern/deviations: Step-to pattern, Antalgic, Decreased weight shift to right, Decreased stance time - right ?Gait velocity: decreased ?  ?  ?General Gait Details: 25% VC's on proper walker to self distance and upright posture.  tolerated an increased distance.  Recliner following for safety. ? ? ?Stairs ?  ?  ?  ?  ?  ? ? ?Wheelchair Mobility ?  ? ?Modified Rankin (Stroke Patients Only) ?  ? ? ?  ?Balance   ?  ?  ?  ?  ?  ?  ?  ?  ?  ?  ?  ?  ?  ?  ?  ?  ?  ?  ?  ? ?  ?Cognition Arousal/Alertness: Awake/alert ?Behavior During Therapy: [facility] for tasks assessed/performed ?Overall Cognitive Status: Within Functional Limits for tasks assessed ?  ?  ?  ?  ?  ?  ?  ?  ?  ?  ?  ?  ?  ?  ?  ?  ?General Comments: AxO x 3 motivated ?  ?  ? ?  ?Exercises  Total Knee Replacement TE's following HEP  handout ?10 reps B LE ankle pumps ?05 reps towel squeezes ?05 reps knee presses ?05 reps heel slides  ?05 reps SAQ's ?05 reps SLR's ?05 reps ABD ?Educated on use of gait belt to assist with TE's ?Followed by ICE ? ? ?  ?General Comments   ?  ?  ? ?Pertinent Vitals/Pain Pain Assessment ?Pain Assessment: 0-10 ?Pain Location: R knee ?Pain Descriptors / Indicators: Discomfort, Sore, Aching, Operative site guarding ?Pain Intervention(s): Limited activity within patient's tolerance, Monitored during session, Repositioned, Ice applied  ? ? ?Home Living   ?  ?  ?  ?  ?  ?  ?   ?  ?  ?   ?  ?Prior Function    ?  ?  ?   ? ?PT Goals (current goals can now be found in the care plan section) Progress towards PT goals: Progressing toward goals ? ?  ?Frequency ? ? ? 7X/week ? ? ? ?  ?PT Plan Current plan remains appropriate  ? ? ?Co-evaluation   ?  ?  ?  ?  ? ?  ?AM-PAC PT "6 Clicks" Mobility   ?Outcome Measure ? Help needed turning from your back to your side while in a flat bed without using bedrails?: A Little ?Help needed moving from lying on your back to sitting on the side of a flat bed without using bedrails?: A Little ?Help needed moving to and from a bed to a chair (including a wheelchair)?: A Little ?Help needed standing up from a chair using your arms (e.g., wheelchair or bedside chair)?: A Little ?Help needed to walk in hospital room?: A Little ?Help needed climbing 3-5 steps with a railing? : A Lot ?6 Click Score: 17 ? ?  ?End of Session Equipment Utilized During Treatment: Gait belt ?Activity Tolerance: Patient limited by pain ?Patient left: in chair;with call bell/phone within reach;with chair alarm set ?Nurse Communication: Mobility status ?PT Visit Diagnosis: Pain;Other abnormalities of gait and mobility (R26.89) ?Pain - Right/Left: Right ?Pain - part of body: Knee ?  ? ? ?Time: [id number] ?PT Time Calculation (min) (ACUTE ONLY): 26 min ? ?Charges:  $Gait Training: 8-22 mins ?$Therapeutic Exercise: 8-22 mins          ?          ?Rica Koyanagi  PTA ?Acute  Rehabilitation Services ?Pager      [phone] ?Office      [phone] ? ?

## 2022-03-19 NOTE — Plan of Care (Signed)
  Problem: Education: Goal: Knowledge of General Education information will improve Description: Including pain rating scale, medication(s)/side effects and non-pharmacologic comfort measures Outcome: Progressing   Problem: Activity: Goal: Risk for activity intolerance will decrease Outcome: Progressing   Problem: Pain Managment: Goal: General experience of comfort will improve Outcome: Progressing   

## 2022-03-19 NOTE — Progress Notes (Addendum)
? ? ? ?  Subjective: ?2 Days Post-Op Procedure(s) (LRB): ?RIGHT TOTAL KNEE ARTHROPLASTY (Right) ?POD#2 Foley removed yesterday and difficulty with voiding since then, only able to void in small amounts 50cc or so. History of TURP and nephrolithiasis, Dr. Patsy [place] Urologist, has had a stent in the past. Started in and out caths yesterday and this is now starting to result in  ?Blood in urine and some clots. He request a urology consultation. ? ?Patient reports pain as moderate.   ? ?Objective:  ? ?VITALS:  Temp:  [98.2 ?F (36.8 ?C)-99.2 ?F (37.3 ?C)] 98.6 ?F (37 ?C) (05/07 0540) ?Pulse Rate:  [80-93] 80 (05/07 0540) ?Resp:  [17-18] 18 (05/07 0540) ?BP: (145-159)/(74-75) 145/74 (05/07 0540) ?SpO2:  [94 %-97 %] 97 % (05/07 0540) ? ?Neurologically intact ?ABD soft ?Neurovascular intact ?Sensation intact distally ?Intact pulses distally ?Dorsiflexion/Plantar flexion intact ?Incision: moderate drainage and Has swelling right leg at the knee and right  calf ?Bladder scan are in the 300 range post void. ?IVF at 75 cc/hr, discontinued this AM ? ? ?LABS ?Recent Labs  ?  03/18/22 ?0333  ?HGB 10.4*  ?WBC 11.7*  ?PLT 200  ? ?Recent Labs  ?  03/18/22 ?0333  ?NA 137  ?K 5.3*  ?CL 105  ?CO2 23  ?BUN 26*  ?CREATININE 1.90*  ?GLUCOSE 128*  ? ?No results for input(s): LABPT, INR in the last 72 hours. ? ? ?Assessment/Plan: ?2 Days Post-Op Procedure(s) (LRB): ?RIGHT TOTAL KNEE ARTHROPLASTY (Right) ?Urinary retention probably related to some mild obstruction and affect of the narcotics and muscle relaxers and anesthesia. Intermittant cath is starting to irritate urethra and bladder. ? ?Advance diet ?Up with therapy ?D/C IV fluids ?Consult called to Urology and Joellen Jersey will see today and discuss with patient. ?In spite of Total Joint protocols may need an indwelling foley cath. ?Swelling right leg, needs to elevate and use Ice even though he is 24 hours post op.  ?On eloquis but with the LE edema with orange peel affect into the right calf  recommend doppler ultrasound be done tomorrow prior to discharge.  ? ?Basil Dess ?03/19/2022, 9:38 AM Patient ID: Evalyn Casco, male   DOB: 07-Apr-1956, 66 y.o.   MRN: [id number] ? ?

## 2022-03-19 NOTE — Progress Notes (Signed)
Patient sleeping in bed, appears comfortable, no acute signs of distress, will continue to monitor for pain relief needs and voiding difficulties. ?

## 2022-03-20 ENCOUNTER — Encounter (HOSPITAL_COMMUNITY): Payer: Self-pay | Admitting: Orthopaedic Surgery

## 2022-03-20 DIAGNOSIS — M1711 Unilateral primary osteoarthritis, right knee: Secondary | ICD-10-CM | POA: Diagnosis not present

## 2022-03-20 MED ORDER — HYDROMORPHONE HCL 4 MG PO TABS: 4.0000 mg | ORAL_TABLET | ORAL | 0 refills | Status: DC | PRN

## 2022-03-20 MED ORDER — HYDROMORPHONE HCL 2 MG PO TABS
2.0000 mg | ORAL_TABLET | Freq: Once | ORAL | Status: AC
  Administered 2022-03-20: 2 mg via ORAL
  Filled 2022-03-20: qty 1

## 2022-03-20 NOTE — Progress Notes (Signed)
Patient ID: Parker Daniel, male   DOB: 1956/02/09, 66 y.o.   MRN: [id number] ?The patient is awake and alert this morning.  His vital signs are stable.  I appreciate urology consult and he understands the Foley will stay in until midweek until he follows up with urology as an outpatient.  The plan is to discharge him to home today.  Of note the ultrasound was negative for DVT of the right lower extremity.  I stressed the importance of elevation and get his knee straight and bending as well. ?

## 2022-03-20 NOTE — Discharge Summary (Signed)
?Patient ID: ?Parker Daniel ?MRN: [id number] ?DOB/AGE: 1956-07-20 66 y.o. ? ?Admit date: 03/17/2022 ?Discharge date: 03/20/2022 ? ?Admission Diagnoses:  ?Principal Problem: ?  Unilateral primary osteoarthritis, right knee ?Active Problems: ?  Status post total right knee replacement ? ? ?Discharge Diagnoses:  ?Same ? ?Past Medical History:  ?Diagnosis Date  ? Arthritis   ? Dyspnea   ? with exertion  ? GERD (gastroesophageal reflux disease)   ? Gout   ? History of kidney stones   ? Hyperlipidemia   ? Hypertension   ? Pneumonia   ? 10/2021  ? ? ?Surgeries: Procedure(s): ?RIGHT TOTAL KNEE ARTHROPLASTY on 03/17/2022 ?  ?Consultants:  ? ?Discharged Condition: Improved ? 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 Patient has severe unremitting pain that affects sleep, daily activities, and work/hobbies. After pre-op clearance the patient was taken to the operating room on 03/17/2022 and underwent  Procedure(s): ?RIGHT TOTAL KNEE ARTHROPLASTY.   ? ?Patient was given perioperative antibiotics:  ?Anti-infectives (From admission, onward)  ? ? Start     Dose/Rate Route Frequency Ordered Stop  ? 03/17/22 1800  ceFAZolin (ANCEF) IVPB 1 g/50 mL premix       ? 1 g ?100 mL/hr over 30 Minutes Intravenous Every 6 hours 03/17/22 1526 03/18/22 0122  ? 03/17/22 0830  ceFAZolin (ANCEF) IVPB 2g/100 mL premix       ? 2 g ?200 mL/hr over 30 Minutes Intravenous On call to O.R. 03/17/22 0819 03/17/22 1140  ? ?  ?  ? ?Patient was given sequential compression devices, early ambulation, and chemoprophylaxis to prevent DVT. ? ?Patient benefited maximally from hospital stay and there were no complications.  Due to leg swelling, a Doppler ultrasound was obtained of the right lower extremity which was negative for DVT.  Unfortunately due to urinary retention urology consult was obtained and a Foley catheter was placed which will stay in upon discharge  with close follow-up with urology. ? ?Recent vital signs: Patient Vitals for the past 24 hrs: ? BP Temp Temp src Pulse Resp SpO2  ?03/20/22 0445 92/73 97.8 ?F (36.6 ?C) -- (!) 58 18 95 %  ?03/19/22 2025 (!) 102/58 -- -- 65 -- --  ?03/19/22 2023 (!) 98/58 98.6 ?F (37 ?C) Oral 65 18 94 %  ?03/19/22 1258 130/67 98.1 ?F (36.7 ?C) Oral 77 15 100 %  ?  ? ?Recent laboratory studies:  ?Recent Labs  ?  03/18/22 ?0333  ?WBC 11.7*  ?HGB 10.4*  ?HCT 34.7*  ?PLT 200  ?NA 137  ?K 5.3*  ?CL 105  ?CO2 23  ?BUN 26*  ?CREATININE 1.90*  ?GLUCOSE 128*  ?CALCIUM 8.1*  ? ? ? ?Discharge Medications:   ?Allergies as of 03/20/2022   ? ?   Reactions  ? Atorvastatin Other (See Comments)  ? Other reaction(s): Myalgia ?Takes pravastatin at home  ? Rosuvastatin Other (See Comments)  ? Other reaction(s): Myalgia ?Takes pravastatin at home  ? ?  ? ?  ?Medication List  ?  ? ?TAKE these medications   ? ?acetaminophen 500 MG tablet ?Commonly known as: TYLENOL ?Take 1,000 mg by mouth every 8 (eight) hours as needed for moderate pain. ?  ?allopurinol 300 MG tablet ?Commonly known as: ZYLOPRIM ?Take 300 mg by mouth daily at 6 PM. Pt takes at 6 pm at nite ?  ?Calcium Carb-Cholecalciferol 500-10 MG-MCG Tabs ?Take 1 tablet by mouth daily. ?  ?  ELDERBERRY PO ?Take 2 tablets by mouth daily. Vitamin C & Zinc ?  ?Eliquis 5 MG Tabs tablet ?Generic drug: apixaban ?Take 1 tablet (5 mg total) by mouth 2 (two) times daily. ?  ?gabapentin 300 MG capsule ?Commonly known as: NEURONTIN ?Take 600 mg by mouth 3 (three) times daily. ?  ?HYDROmorphone 4 MG tablet ?Commonly known as: Dilaudid ?Take 1 tablet (4 mg total) by mouth every 4 (four) hours as needed for severe pain. ?  ?ibuprofen 200 MG tablet ?Commonly known as: ADVIL ?Take 400 mg by mouth every 8 (eight) hours as needed for moderate pain. ?  ?metoprolol succinate 25 MG 24 hr tablet ?Commonly known as: TOPROL-XL ?Take 25 mg by mouth in the morning. ?  ?Multaq 400 MG tablet ?Generic drug: dronedarone ?Take 400 mg by  mouth 2 (two) times daily. Pt takes at 6pm and 6 am ?  ?Multivitamin Adult Chew ?Chew 1 each by mouth daily. Sugar Free ?  ?OMEGA-3 PLUS PO ?Take 2 tablets by mouth daily. Max ?  ?oxyCODONE 15 MG immediate release tablet ?Commonly known as: ROXICODONE ?Take 15 mg by mouth 2 (two) times daily as needed for pain. ?  ?pravastatin 40 MG tablet ?Commonly known as: PRAVACHOL ?Take 40 mg by mouth at bedtime. ?  ?senna 8.6 MG tablet ?Commonly known as: SENOKOT ?Take 2-3 tablets by mouth daily as needed for constipation. ?  ?tamsulosin 0.4 MG Caps capsule ?Commonly known as: FLOMAX ?Take 0.4 mg by mouth at bedtime. 9 pm ?  ?testosterone cypionate 200 MG/ML injection ?Commonly known as: DEPOTESTOSTERONE CYPIONATE ?Inject 200 mg into the muscle every 21 ( twenty-one) days. ?  ?tiZANidine 4 MG tablet ?Commonly known as: ZANAFLEX ?Take 1 tablet (4 mg total) by mouth every 6 (six) hours as needed for muscle spasms. ?  ?traZODone 50 MG tablet ?Commonly known as: DESYREL ?Take 50 mg by mouth at bedtime. ?  ?vitamin C 250 MG tablet ?Commonly known as: ASCORBIC ACID ?Take 500 mg by mouth daily. ?  ?Vitamin D 50 MCG (2000 UT) Caps ?Take 4,000 Units by mouth daily. ?  ?Xtampza ER 18 MG C12a ?Generic drug: oxyCODONE ER ?Take 18 mg by mouth every 12 (twelve) hours. Pt takes at 6pm and 6 am ?  ? ?  ? ?  ?  ? ? ?  ?Durable Medical Equipment  ?(From admission, onward)  ?  ? ? ?  ? ?  Start     Ordered  ? 03/17/22 1655  DME 3 n 1  Once       ? 03/17/22 1654  ? 03/17/22 1655  DME Walker rolling  Once       ?Question Answer Comment  ?Walker: With 5 Inch Wheels   ?Patient needs a walker to treat with the following condition Status post total right knee replacement   ?  ? 03/17/22 1654  ? ?  ?  ? ?  ? ? ?Diagnostic Studies: DG Knee Right Port ? ?Result Date: 03/17/2022 ?CLINICAL DATA:  Right knee surgery EXAM: PORTABLE RIGHT KNEE - 1-2 VIEW COMPARISON:  01/04/2022 FINDINGS: Interval postsurgical changes from right total knee arthroplasty.  Arthroplasty components are in their expected alignment. No periprosthetic fracture or evidence of other complication. Expected postoperative changes within the overlying soft tissues. IMPRESSION: Interval postsurgical changes from right total knee arthroplasty. Electronically Signed   By: Davina Poke D.O.   On: 03/17/2022 17:23  ? ?VAS [place] LOWER EXTREMITY VENOUS (DVT) ? ?Result Date: 03/19/2022 ? Lower Venous  DVT Study Patient Name:  KRISTJAN SAYED  Date of Exam:   03/19/2022 Medical Rec #: [id number]     Accession #:    [id number] Date of Birth: 1956/04/09     Patient Gender: M Patient Age:   59 years Exam Location:  [facility] Procedure:      VAS [place] LOWER EXTREMITY VENOUS (DVT) Referring Phys: Jeneen Rinks NITKA --------------------------------------------------------------------------------  Indications: Swelling s/p total RT knee arthroplasty.  Limitations: Poor ultrasound/tissue interface. Comparison Study: No prior studies. Performing Technologist: Darlin Coco RDMS, RVT  Examination Guidelines: A complete evaluation includes B-mode imaging, spectral Doppler, color Doppler, and power Doppler as needed of all accessible portions of each vessel. Bilateral testing is considered an integral part of a complete examination. Limited examinations for reoccurring indications may be performed as noted. The reflux portion of the exam is performed with the patient in reverse Trendelenburg.  +---------+---------------+---------+-----------+----------+--------------+ RIGHT    CompressibilityPhasicitySpontaneityPropertiesThrombus Aging +---------+---------------+---------+-----------+----------+--------------+ CFV      Full           Yes      Yes                                 +---------+---------------+---------+-----------+----------+--------------+ SFJ      Full                                                        +---------+---------------+---------+-----------+----------+--------------+ FV Prox   Full                                                        +---------+---------------+---------+-----------+----------+--------------+ FV Mid   Full                                                        +---------+---------------+

## 2022-03-20 NOTE — Progress Notes (Signed)
Physical Therapy Treatment ?Patient Details ?Name: Parker Daniel ?MRN: [id number] ?DOB: [birth year] ?Today's Date: 03/20/2022 ? ? ?History of Present Illness Pt is a 25male s/p R-TKA on 03/17/22. PMH: GERD, gout, HLD, HTN, L-THA 2020. ? ?  ?PT Comments  ? ? POD # 3 am session ?Pt dressed and eager to D/C to home.  Spouse present during session.  Assisted with amb in hallway ans well as practice stairs.  General transfer comment: 25% VC's on proper hand placement and safety with turns.  Also requires increased time.General Gait Details: 25% VC's on proper walker to self distance and upright posture.  tolerated an increased distance. amb to and from stairs. General Gait Details: 25% VC's on proper walker to self distance and upright posture.  tolerated an increased distance. amb to and from stairs. General stair comments: Educated Spouse on proper application of KI to be wore for increased support esp while navigating stairs.  pt has 2 separate one steps to get into house.  With Spouse "hands on" assisted up one step.  Attempted but pt was unable to step up forward.  Instructed on proper tech to step up backward. Addressed all mobility questions, discussed appropriate activity, educated on use of ICE.  Pt ready for D/C to home. ?  ?Recommendations for follow up therapy are one component of a multi-disciplinary discharge planning process, led by the attending physician.  Recommendations may be updated based on patient status, additional functional criteria and insurance authorization. ? ?Follow Up Recommendations ? Follow physician's recommendations for discharge plan and follow up therapies ?  ?  ?Assistance Recommended at Discharge Frequent or constant Supervision/Assistance  ?Patient can return home with the following A little help with walking and/or transfers;A little help with bathing/dressing/bathroom;Assistance with cooking/housework;Help with stairs or ramp for entrance;Assist for transportation ?  ?Equipment  Recommendations ? Rolling walker (2 wheels)  ?  ?Recommendations for Other Services   ? ? ?  ?Precautions / Restrictions Precautions ?Precautions: Fall;Knee ?Precaution Comments: instructed no pillow under knee and advised to wear KI for stairs ?Restrictions ?Weight Bearing Restrictions: No ?RLE Weight Bearing: Weight bearing as tolerated  ?  ? ?Mobility ? Bed Mobility ?Overal bed mobility: Needs Assistance ?Bed Mobility: Supine to Sit ?  ?  ?Supine to sit: Supervision ?  ?  ?General bed mobility comments: self able wih increased time ?  ? ?Transfers ?Overall transfer level: Needs assistance ?Equipment used: Rolling walker (2 wheels) ?Transfers: Sit to/from Stand ?Sit to Stand: Min assist, From elevated surface, Min guard ?  ?  ?  ?  ?  ?General transfer comment: 25% VC's on proper hand placement and safety with turns.  Also requires increased time. ?  ? ?Ambulation/Gait ?Ambulation/Gait assistance: Min guard, Supervision ?Gait Distance (Feet): 25 Feet ?Assistive device: Rolling walker (2 wheels) ?Gait Pattern/deviations: Step-to pattern, Antalgic, Decreased weight shift to right, Decreased stance time - right ?Gait velocity: decreased ?  ?  ?General Gait Details: 25% VC's on proper walker to self distance and upright posture.  tolerated an increased distance. amb to and from stairs. ? ? ?Stairs ?Stairs: Yes ?Stairs assistance: Supervision, Min guard ?Stair Management: Step to pattern, With walker, Backwards ?Number of Stairs: 1 ?General stair comments: Educated Spouse on proper application of KI to be wore for increased support esp while navigating stairs.  pt has 2 separate one steps to get into house.  With Spouse "hands on" assisted up one step.  Attempted but pt was unable to step up forward.  Instructed on proper tech to step up backward. ? ? ?Wheelchair Mobility ?  ? ?Modified Rankin (Stroke Patients Only) ?  ? ? ?  ?Balance   ?  ?  ?  ?  ?  ?  ?  ?  ?  ?  ?  ?  ?  ?  ?  ?  ?  ?  ?  ? ?  ?Cognition  Arousal/Alertness: Awake/alert ?Behavior During Therapy: [facility] for tasks assessed/performed ?Overall Cognitive Status: Within Functional Limits for tasks assessed ?  ?  ?  ?  ?  ?  ?  ?  ?  ?  ?  ?  ?  ?  ?  ?  ?General Comments: AxO x 3 motivated, eager to go home ?  ?  ? ?  ?Exercises   ? ?  ?General Comments   ?  ?  ? ?Pertinent Vitals/Pain Pain Assessment ?Pain Assessment: 0-10 ?Pain Score: 9  ?Pain Location: R knee ?Pain Descriptors / Indicators: Discomfort, Sore, Operative site guarding, Tightness ?Pain Intervention(s): Monitored during session, Premedicated before session, Repositioned  ? ? ?Home Living   ?  ?  ?  ?  ?  ?  ?  ?  ?  ?   ?  ?Prior Function    ?  ?  ?   ? ?PT Goals (current goals can now be found in the care plan section) Progress towards PT goals: Progressing toward goals ? ?  ?Frequency ? ? ? 7X/week ? ? ? ?  ?PT Plan Current plan remains appropriate  ? ? ?Co-evaluation   ?  ?  ?  ?  ? ?  ?AM-PAC PT "6 Clicks" Mobility   ?Outcome Measure ? Help needed turning from your back to your side while in a flat bed without using bedrails?: None ?Help needed moving from lying on your back to sitting on the side of a flat bed without using bedrails?: None ?Help needed moving to and from a bed to a chair (including a wheelchair)?: None ?Help needed standing up from a chair using your arms (e.g., wheelchair or bedside chair)?: None ?Help needed to walk in hospital room?: A Little ?Help needed climbing 3-5 steps with a railing? : A Little ?6 Click Score: 22 ? ?  ?End of Session Equipment Utilized During Treatment: Gait belt ?Activity Tolerance: Patient limited by pain ?Patient left: in chair;with call bell/phone within reach;with chair alarm set ?Nurse Communication: Mobility status (pt ready for D/C to home.) ?PT Visit Diagnosis: Pain;Other abnormalities of gait and mobility (R26.89) ?Pain - Right/Left: Right ?Pain - part of body: Knee ?  ? ? ?Time: 1115-1140 ?PT Time Calculation (min) (ACUTE ONLY): 25  min ? ?Charges:  $Gait Training: 8-22 mins ?$Therapeutic Activity: 8-22 mins          ?          ? ?{Vaudie Engebretsen  PTA ?Acute  Rehabilitation Services ?Pager      [phone] ?Office      [phone] ? ? ?

## 2022-03-20 NOTE — Plan of Care (Signed)
DC order received, instructions given and all questions answered. Pt transported to front exit via wheel chair.  ? ?Problem: Education: ?Goal: Knowledge of General Education information will improve ?Description: Including pain rating scale, medication(s)/side effects and non-pharmacologic comfort measures ?Outcome: Adequate for Discharge ?  ?Problem: Health Behavior/Discharge Planning: ?Goal: Ability to manage health-related needs will improve ?Outcome: Adequate for Discharge ?  ?Problem: Clinical Measurements: ?Goal: Ability to maintain clinical measurements within normal limits will improve ?Outcome: Adequate for Discharge ?Goal: Will remain free from infection ?Outcome: Adequate for Discharge ?Goal: Diagnostic test results will improve ?Outcome: Adequate for Discharge ?Goal: Respiratory complications will improve ?Outcome: Adequate for Discharge ?Goal: Cardiovascular complication will be avoided ?Outcome: Adequate for Discharge ?  ?Problem: Activity: ?Goal: Risk for activity intolerance will decrease ?Outcome: Adequate for Discharge ?  ?Problem: Nutrition: ?Goal: Adequate nutrition will be maintained ?Outcome: Adequate for Discharge ?  ?Problem: Coping: ?Goal: Level of anxiety will decrease ?Outcome: Adequate for Discharge ?  ?Problem: Elimination: ?Goal: Will not experience complications related to bowel motility ?Outcome: Adequate for Discharge ?Goal: Will not experience complications related to urinary retention ?Outcome: Adequate for Discharge ?  ?Problem: Pain Managment: ?Goal: General experience of comfort will improve ?Outcome: Adequate for Discharge ?  ?Problem: Safety: ?Goal: Ability to remain free from injury will improve ?Outcome: Adequate for Discharge ?  ?Problem: Skin Integrity: ?Goal: Risk for impaired skin integrity will decrease ?Outcome: Adequate for Discharge ?  ?

## 2022-03-23 ENCOUNTER — Ambulatory Visit (INDEPENDENT_AMBULATORY_CARE_PROVIDER_SITE_OTHER): Payer: Medicare HMO | Admitting: Orthopaedic Surgery

## 2022-03-23 ENCOUNTER — Encounter: Payer: Self-pay | Admitting: Orthopaedic Surgery

## 2022-03-23 DIAGNOSIS — Z96651 Presence of right artificial knee joint: Secondary | ICD-10-CM

## 2022-03-23 MED ORDER — HYDROMORPHONE HCL 4 MG PO TABS: 4.0000 mg | ORAL_TABLET | ORAL | 0 refills | Status: DC | PRN

## 2022-03-23 NOTE — Progress Notes (Signed)
The patient is only 6 days status post a right total knee arthroplasty.  I believe his postoperative appointment was made too early.  He did have a postoperative complication though of urinary retention.  He did see urology earlier this week after going home on Monday with a Foley catheter.  He is doing much better overall and the Foley catheter has been removed.  He does report significant swelling with his right leg.  We did obtain an ultrasound was in the hospital to rule out DVT.  He is on chronic Eliquis.  That does create some more swelling in general with the knee itself. ? ?His extension is almost full and he is already flexing to 90 degrees.  The incision looks good but is too early to remove the staples. ? ?He can get his knee wet in the shower and work on aggressive motion of the right knee.  I will refill his Dilaudid.  Of note he is on chronic narcotics and we will help with his pain management for the first few weeks. ? ?We will see him back next week for staple removal but no x-rays are needed. ?

## 2022-03-30 ENCOUNTER — Encounter: Payer: Self-pay | Admitting: Orthopaedic Surgery

## 2022-03-30 ENCOUNTER — Ambulatory Visit (INDEPENDENT_AMBULATORY_CARE_PROVIDER_SITE_OTHER): Payer: Medicare HMO | Admitting: Orthopaedic Surgery

## 2022-03-30 DIAGNOSIS — Z96651 Presence of right artificial knee joint: Secondary | ICD-10-CM

## 2022-03-30 MED ORDER — TIZANIDINE HCL 4 MG PO TABS: 4.0000 mg | ORAL_TABLET | Freq: Four times a day (QID) | ORAL | 0 refills | Status: DC | PRN

## 2022-03-30 MED ORDER — HYDROMORPHONE HCL 4 MG PO TABS: 4.0000 mg | ORAL_TABLET | ORAL | 0 refills | Status: DC | PRN

## 2022-03-30 NOTE — Progress Notes (Signed)
The patient is 2 weeks status post a right total knee arthroplasty.  He has significant deformity of his right knee at the time of surgery.  He is on chronic pain medication and does need a refill of his Dilaudid and Zanaflex which I agree with.  He has been participating in home health therapy and they are pushing along.  He has been ambulating with a cane.  He is on chronic blood thinning medication already with Eliquis.  His staples are removed and Steri-Strips applied to his right knee.  He lacks full extension by about 3 to 5 degrees and I can flex him to about 85 degrees.  He understands the importance of transitioning to outpatient physical therapy so I did give him a prescription for outpatient physical therapy with [facility].  I did refill his medications.  I like to see him back in 4 weeks for repeat exam but no x-rays are needed.

## 2022-04-05 ENCOUNTER — Other Ambulatory Visit: Payer: Self-pay | Admitting: Orthopaedic Surgery

## 2022-04-05 ENCOUNTER — Telehealth: Payer: Self-pay | Admitting: Surgery

## 2022-04-05 MED ORDER — HYDROMORPHONE HCL 4 MG PO TABS: 4.0000 mg | ORAL_TABLET | ORAL | 0 refills | Status: DC | PRN

## 2022-04-05 NOTE — Telephone Encounter (Signed)
Patient is requesting refill on pain medication.

## 2022-04-11 ENCOUNTER — Telehealth: Payer: Self-pay

## 2022-04-11 ENCOUNTER — Other Ambulatory Visit: Payer: Self-pay | Admitting: Orthopaedic Surgery

## 2022-04-11 MED ORDER — TIZANIDINE HCL 4 MG PO TABS: 4.0000 mg | ORAL_TABLET | Freq: Four times a day (QID) | ORAL | 0 refills | Status: DC | PRN

## 2022-04-11 MED ORDER — HYDROMORPHONE HCL 4 MG PO TABS: 4.0000 mg | ORAL_TABLET | ORAL | 0 refills | Status: DC | PRN

## 2022-04-11 NOTE — Telephone Encounter (Signed)
Refill request from [facility] for Hydromorphone and Zanaflex

## 2022-04-27 ENCOUNTER — Ambulatory Visit (INDEPENDENT_AMBULATORY_CARE_PROVIDER_SITE_OTHER): Payer: Medicare HMO | Admitting: Orthopaedic Surgery

## 2022-04-27 ENCOUNTER — Encounter: Payer: Self-pay | Admitting: Orthopaedic Surgery

## 2022-04-27 DIAGNOSIS — Z96651 Presence of right artificial knee joint: Secondary | ICD-10-CM

## 2022-04-27 NOTE — Progress Notes (Signed)
The patient comes today 6 weeks status post a right total knee arthroplasty.  He reports that he is doing well and is increasing his range of motion and strength.  He he is sore.  He is on chronic pain management but we have taken over his pain management needs short-term.  He sees pain management again I believe next month.  He says right now he is doing well and does not need a refill just yet.  He is walking without assistive ice.  On exam he still lacks full extension of about 5 degrees but his flexion is excellent and getting to be almost full.  His swelling is to be expected that he has postoperative being that he is on Eliquis long-term.  His calf is soft.  He will continue increase his activities as comfort allows.  I would like to see him back in 3 months for repeat exam.  I would like an AP and lateral standing of his right knee at that visit.

## 2022-07-27 ENCOUNTER — Ambulatory Visit (INDEPENDENT_AMBULATORY_CARE_PROVIDER_SITE_OTHER): Payer: Medicare HMO

## 2022-07-27 ENCOUNTER — Ambulatory Visit: Payer: Medicare HMO | Admitting: Orthopaedic Surgery

## 2022-07-27 ENCOUNTER — Encounter: Payer: Self-pay | Admitting: Orthopaedic Surgery

## 2022-07-27 DIAGNOSIS — Z96651 Presence of right artificial knee joint: Secondary | ICD-10-CM

## 2022-07-27 NOTE — Progress Notes (Signed)
The patient is just past 4 months status post a right total knee arthroplasty.  He is being seen in follow-up today.  He states that he is doing well.  He still has some swelling and numbness but overall he is making good progress with range of motion and strength.  We have replaced his left hip before.  He does still walk with a slight limp.  He has good flexion and extension of the right knee and it feels ligamentously stable.  There is some swelling to be expected at 4 months.  2 views of the right knee show a stable well-seated total knee arthroplasty with no complicating features.  This standpoint see him back in 6 months to see how he is doing overall.  We will have a final AP and lateral of his right knee at that visit.  All question concerns were answered and addressed.

## 2022-12-07 NOTE — Progress Notes (Signed)
Cardiology Office Note:   Date:  12/08/2022  NAME:  Parker Daniel    MRN: [id number] DOB:  [birth year]   PCP:  System, Provider Not In  Cardiologist:  None  Electrophysiologist:  None   Referring MD: No ref. provider found   Chief Complaint  Patient presents with   Atrial Fibrillation    History of Present Illness:   Parker Daniel is a 67 y.o. male with a hx of atrial fibrillation, DM, HTN who is being seen today for the evaluation of atrial fibrillation at the request of Freeman Caldron, MD.  Parker Daniel is the husband of Ms. 76 who is a patient of mine who underwent mitral valve surgery last year.  He has been diagnosed with atrial fibrillation in the past.  Status post cardioversion.  On Multaq.  He is diabetic with an A1c of 6.6.  He is on Mounjaro.  He is obese.  He is try to lose weight.  His blood pressure is well-controlled.  He has never had a heart attack or stroke.  He did have an elevated coronary calcium score.  He reports he gets short of breath with activity.  He reports any heavy activity can get him short of breath and tight in his chest.  He reports no fevers or chills.  He may have sleep apnea but is not wanting to do a study.  His LDL cholesterol could be a bit lower.  Currently on pravastatin and Zetia.  We discussed trying Lipitor.  He reports he may have taken this in the past and is unaware of any issues.  We discussed giving this a trial versus PCSK9 inhibitor therapy.  He is okay to start with Lipitor as a first approach.  His CV examination is unremarkable.  His blood pressure is well-controlled.  He does not smoke.  No alcohol or drug use is reported.  [facility] [place].  Primary care and electrophysiologist are in the [facility] system.  Problem List Atrial fibrillation  -DCCV 01/25/2021 2. Coronary calcium  -224 (58th percentile) 3. DM -A1c 6.6 4. HLD -T chol 140, TG 91, HDL 45, LDL 76 5. HTN  Past  Medical History: Past Medical History:  Diagnosis Date   Arthritis    Coronary artery disease    Diabetes mellitus without complication (HCC)    Dyspnea    with exertion   GERD (gastroesophageal reflux disease)    Gout    History of kidney stones    Hyperlipidemia    Hypertension    Pneumonia    10/2021    Past Surgical History: Past Surgical History:  Procedure Laterality Date   APPENDECTOMY     age 10   CHOLECYSTECTOMY     age 89   CYSTOSCOPY/URETEROSCOPY/HOLMIUM LASER/STENT PLACEMENT Right 01/24/2022   Procedure: CYSTOSCOPY/RETROGRADE/URETEROSCOPY/HOLMIUM LASER/STENT PLACEMENT;  Surgeon: Jerilee Field, MD;  Location: [facility];  Service: Urology;  Laterality: Right;   KNEE SURGERY     SURGERY SCROTAL / TESTICULAR     age 40   THULIUM LASER TURP (TRANSURETHRAL RESECTION OF PROSTATE) N/A 01/24/2022   Procedure: THULIUM LASER TURP (TRANSURETHRAL RESECTION OF PROSTATE);  Surgeon: Jerilee Field, MD;  Location: [facility];  Service: Urology;  Laterality: N/A;   TONSILLECTOMY     TOTAL HIP ARTHROPLASTY Left 06/27/2019   Procedure: LEFT TOTAL HIP ARTHROPLASTY ANTERIOR APPROACH;  Surgeon: Kathryne Hitch, MD;  Location: [facility];  Service: Orthopedics;  Laterality:  Left;   TOTAL KNEE ARTHROPLASTY Right 03/17/2022   Procedure: RIGHT TOTAL KNEE ARTHROPLASTY;  Surgeon: Kathryne Hitch, MD;  Location: [facility];  Service: Orthopedics;  Laterality: Right;    Current Medications: Current Meds  Medication Sig   acetaminophen (TYLENOL) 500 MG tablet Take 1,000 mg by mouth every 8 (eight) hours as needed for moderate pain.   allopurinol (ZYLOPRIM) 300 MG tablet Take 300 mg by mouth daily at 6 PM. Pt takes at 6 pm at nite   amoxicillin (AMOXIL) 500 MG capsule Take 500 mg by mouth 2 (two) times daily.   atorvastatin (LIPITOR) 40 MG tablet Take 1 tablet (40 mg total) by mouth daily.   Calcium Carb-Cholecalciferol 500-10 MG-MCG TABS Take 1 tablet by mouth daily.   Cholecalciferol  (VITAMIN D) 50 MCG (2000 UT) CAPS Take 4,000 Units by mouth daily.   ELIQUIS 5 MG TABS tablet Take 1 tablet (5 mg total) by mouth 2 (two) times daily.   ezetimibe (ZETIA) 10 MG tablet Take 1 tablet by mouth daily.   gabapentin (NEURONTIN) 300 MG capsule Take 600 mg by mouth 3 (three) times daily.   HYDROmorphone (DILAUDID) 4 MG tablet Take 1 tablet (4 mg total) by mouth every 4 (four) hours as needed for severe pain.   ibuprofen (ADVIL) 200 MG tablet Take 400 mg by mouth every 8 (eight) hours as needed for moderate pain.   losartan (COZAAR) 50 MG tablet 50 mg daily.   metoprolol succinate (TOPROL-XL) 25 MG 24 hr tablet Take 25 mg by mouth in the morning.   metoprolol tartrate (LOPRESSOR) 100 MG tablet Take 1 tablet by mouth once for procedure.   MOUNJARO 2.5 MG/0.5ML Pen Inject into the skin.   MULTAQ 400 MG tablet Take 400 mg by mouth 2 (two) times daily. Pt takes at 6pm and 6 am   Multiple Vitamins-Minerals (MULTIVITAMIN ADULT) CHEW Chew 1 each by mouth daily. Sugar Free   oxyCODONE (ROXICODONE) 15 MG immediate release tablet Take 15 mg by mouth 2 (two) times daily as needed for pain.   senna (SENOKOT) 8.6 MG tablet Take 2-3 tablets by mouth daily as needed for constipation.   tamsulosin (FLOMAX) 0.4 MG CAPS capsule Take 0.4 mg by mouth at bedtime. 9 pm   testosterone cypionate (DEPOTESTOSTERONE CYPIONATE) 200 MG/ML injection Inject 200 mg into the muscle every 21 ( twenty-one) days.   tiZANidine (ZANAFLEX) 4 MG tablet Take 1 tablet (4 mg total) by mouth every 6 (six) hours as needed for muscle spasms.   traZODone (DESYREL) 50 MG tablet Take 50 mg by mouth at bedtime.   vitamin C (ASCORBIC ACID) 250 MG tablet Take 500 mg by mouth daily.   XTAMPZA ER 18 MG C12A Take 18 mg by mouth every 12 (twelve) hours. Pt takes at 6pm and 6 am   [DISCONTINUED] ELDERBERRY PO Take 2 tablets by mouth daily. Vitamin C & Zinc   [DISCONTINUED] Omega-3 Fatty Acids (OMEGA-3 PLUS PO) Take 2 tablets by mouth daily.  Max   [DISCONTINUED] pravastatin (PRAVACHOL) 40 MG tablet Take 40 mg by mouth at bedtime.     Allergies:    Atorvastatin and Rosuvastatin   Social History: Social History   Socioeconomic History   Marital status: Married    Spouse name: Not on file   Number of children: 1   Years of education: Not on file   Highest education level: Not on file  Occupational History   Not on file  Tobacco Use   Smoking  status: Never   Smokeless tobacco: Never  Vaping Use   Vaping Use: Never used  Substance and Sexual Activity   Alcohol use: Yes    Comment: rare   Drug use: Never   Sexual activity: Yes    Birth control/protection: None  Other Topics Concern   Not on file  Social History Narrative   Not on file   Social Determinants of Health   Financial Resource Strain: Not on file  Food Insecurity: Not on file  Transportation Needs: Not on file  Physical Activity: Not on file  Stress: Not on file  Social Connections: Not on file     Family History: The patient's family history includes Cancer in his father.  ROS:   All other ROS reviewed and negative. Pertinent positives noted in the HPI.     EKGs/Labs/Other Studies Reviewed:   The following studies were personally reviewed by me today:  EKG:  EKG is ordered today.  The ekg ordered today demonstrates normal sinus rhythm heart rate 75, nonspecific ST-T changes, and was personally reviewed by me.   TTE 01/03/2021   CONCLUSIONS:  1. Mildly dilated left ventricle. The left ventricular systolic function is  borderline. LV Ejection Fraction is approximately: 50 %.  2. Indeterminate diastolic dysfunction based on 2016 ASE guidelines.  3. There are no regional wall motion abnormalities.  4. Normal right ventricular size and systolic function. Normal right  ventricular systolic function.  5. Mildly dilated left atrium.  6. Mild mitral valve regurgitation.   Recent Labs: 03/18/2022: BUN 26; Creatinine, Ser 1.90; Hemoglobin 10.4;  Platelets 200; Potassium 5.3; Sodium 137   Recent Lipid Panel No results found for: "CHOL", "TRIG", "HDL", "CHOLHDL", "VLDL", "LDLCALC", "LDLDIRECT"  Physical Exam:   VS:  BP 102/68   Pulse 75   Ht 5\' 10"  (1.778 m)   Wt 261 lb (118.4 kg)   SpO2 97%   BMI 37.45 kg/m    Wt Readings from Last 3 Encounters:  12/08/22 261 lb (118.4 kg)  03/17/22 260 lb (117.9 kg)  03/08/22 260 lb (117.9 kg)    General: Well nourished, well developed, in no acute distress Head: Atraumatic, normal size  Eyes: PEERLA, EOMI  Neck: Supple, no JVD Endocrine: No thryomegaly Cardiac: Normal S1, S2; RRR; no murmurs, rubs, or gallops Lungs: Clear to auscultation bilaterally, no wheezing, rhonchi or rales  Abd: Soft, nontender, no hepatomegaly  Ext: No edema, pulses 2+ Musculoskeletal: No deformities, BUE and BLE strength normal and equal Skin: Warm and dry, no rashes   Neuro: Alert and oriented to person, place, time, and situation, CNII-XII grossly intact, no focal deficits  Psych: Normal mood and affect   ASSESSMENT:   KORBEN CARCIONE is a 67 y.o. male who presents for the following: 1. SOB (shortness of breath) on exertion   2. Precordial pain   3. Paroxysmal atrial fibrillation (HCC)   4. Agatston coronary artery calcium score between 200 and 399   5. Mixed hyperlipidemia     PLAN:   1. SOB (shortness of breath) on exertion 2. Precordial pain -Who presents with symptoms of shortness of breath and tightness in his chest.  He does have an elevated coronary calcium score.  We discussed proceeding with coronary CTA for further evaluation.  His coronary calcium score is not that high.  He will take 100 mg metoprolol to tartrate to rise before the scan.  He can continue with his metoprolol succinate.  This should not be an issue.  He  will need a BMP today.  There is also mention of his ejection fraction being 50% in 2022 in [place].  I would like to recheck an echocardiogram.  3. Paroxysmal atrial  fibrillation ([place]) -Status post TEE/cardioversion in 2022.  On Multaq 400 mg twice daily.  No recurrence of A-fib.  CHA2DS2-VASc equals 3 (hypertension, diabetes, CAD).  He will continue Eliquis 5 mg twice daily.  4. Agatston coronary artery calcium score between 200 and 399 5. Mixed hyperlipidemia -Coronary CTA as above.  We discussed retrial of Lipitor.  He is on Lipitor.  His LDL is close to goal.  I think it is reasonable for him to try Lipitor including Zetia to get his LDL less than 70.  We will also further refine his risk with coronary CTA to look for noncalcified plaque.  He will see [place] back in 6 months to discuss further.    Disposition: Return in about 6 months (around 06/08/2023).  Medication Adjustments/Labs and Tests Ordered: Current medicines are reviewed at length with the patient today.  Concerns regarding medicines are outlined above.  Orders Placed This Encounter  Procedures   CT CORONARY MORPH W/CTA COR W/SCORE W/CA W/CM &/OR WO/CM   Basic metabolic panel   EKG 85-IDPO   ECHOCARDIOGRAM COMPLETE   Meds ordered this encounter  Medications   metoprolol tartrate (LOPRESSOR) 100 MG tablet    Sig: Take 1 tablet by mouth once for procedure.    Dispense:  1 tablet    Refill:  0   atorvastatin (LIPITOR) 40 MG tablet    Sig: Take 1 tablet (40 mg total) by mouth daily.    Dispense:  90 tablet    Refill:  3    Patient Instructions  Medication Instructions:  STOP Pravastatin  START Lipitor 40 mg daily   Take Metoprolol Tartrate two hours before CT when scheduled.   *If you need a refill on your cardiac medications before your next appointment, please call your pharmacy*   Lab Work: BMET today   If you have labs (blood work) drawn today and your tests are completely normal, you will receive your results only by: [place] (if you have MyChart) OR A paper copy in the mail If you have any lab test that is abnormal or we need to change your treatment, we will  call you to review the results.   Testing/Procedures: Echocardiogram - Your physician has requested that you have an echocardiogram. Echocardiography is a painless test that uses sound waves to create images of your heart. It provides your doctor with information about the size and shape of your heart and how well your heart's chambers and valves are working. This procedure takes approximately one hour. There are no restrictions for this procedure.     Coronary CTA- they will call you to schedule this.    Follow-Up: At [facility], you and your health needs are our priority.  As part of our continuing mission to provide you with exceptional heart care, we have created designated Provider Care Teams.  These Care Teams include your primary Cardiologist (physician) and Advanced Practice Providers (APPs -  Physician Assistants and Nurse Practitioners) who all work together to provide you with the care you need, when you need it.  We recommend signing up for the patient portal called "MyChart".  Sign up information is provided on this After Visit Summary.  MyChart is used to connect with patients for Virtual Visits (Telemedicine).  Patients are able to view lab/test  results, encounter notes, upcoming appointments, etc.  Non-urgent messages can be sent to your provider as well.   To learn more about what you can do with MyChart, go to [email or web address].    Your next appointment:   6 month(s)  Provider:   Lennie Odor, MD   Other Instructions   Your cardiac CT will be scheduled at one of the below locations:   [facility] [address] [place], [place] [address] [phone]   If scheduled at [facility], please arrive at the [facility] and Children's Entrance (Entrance C2) of [facility] 30 minutes prior to test start time. You can use the FREE valet parking offered at entrance C (encouraged to control the heart rate for the test)  Proceed to  the [facility] Radiology Department (first floor) to check-in and test prep.  All radiology patients and guests should use entrance C2 at [facility], accessed from [facility], even though the hospital's physical address listed is [address]     Please follow these instructions carefully (unless otherwise directed):  Hold all erectile dysfunction medications at least 3 days (72 hrs) prior to test. (Ie viagra, cialis, sildenafil, tadalafil, etc) We will administer nitroglycerin during this exam.   On the Night Before the Test: Be sure to Drink plenty of water. Do not consume any caffeinated/decaffeinated beverages or chocolate 12 hours prior to your test. Do not take any antihistamines 12 hours prior to your test.  On the Day of the Test: Drink plenty of water until 1 hour prior to the test. Do not eat any food 1 hour prior to test. You may take your regular medications prior to the test.  Take metoprolol (Lopressor) two hours prior to test. HOLD Furosemide/Hydrochlorothiazide morning of the test.    After the Test: Drink plenty of water. After receiving IV contrast, you may experience a mild flushed feeling. This is normal. On occasion, you may experience a mild rash up to 24 hours after the test. This is not dangerous. If this occurs, you can take Benadryl 25 mg and increase your fluid intake. If you experience trouble breathing, this can be serious. If it is severe call 911 IMMEDIATELY. If it is mild, please call our office. If you take any of these medications: Glipizide/Metformin, Avandament, Glucavance, please do not take 48 hours after completing test unless otherwise instructed.  We will call to schedule your test 2-4 weeks out understanding that some insurance companies will need an authorization prior to the service being performed.   For non-scheduling related questions, please contact the cardiac imaging nurse navigator should you have any  questions/concerns: [place], Cardiac Imaging Nurse Navigator Larey Brick, Cardiac Imaging Nurse Navigator [place] [facility] Direct Office Dial: [phone]   For scheduling needs, including cancellations and rescheduling, please call [place], [phone].      Signed, Lenna Gilford. Flora Lipps, MD, [facility]  [facility]  [address], [address] [place], [place] [address] [phone]  12/08/2022 2:59 PM

## 2022-12-08 ENCOUNTER — Ambulatory Visit: Payer: Medicare HMO | Attending: Cardiovascular Disease | Admitting: Cardiovascular Disease

## 2022-12-08 ENCOUNTER — Encounter: Payer: Self-pay | Admitting: Cardiovascular Disease

## 2022-12-08 VITALS — BP 102/68 | HR 75 | Ht 70.0 in | Wt 261.0 lb

## 2022-12-08 DIAGNOSIS — R931 Abnormal findings on diagnostic imaging of heart and coronary circulation: Secondary | ICD-10-CM | POA: Diagnosis not present

## 2022-12-08 DIAGNOSIS — E782 Mixed hyperlipidemia: Secondary | ICD-10-CM

## 2022-12-08 DIAGNOSIS — R0602 Shortness of breath: Secondary | ICD-10-CM | POA: Diagnosis not present

## 2022-12-08 DIAGNOSIS — R072 Precordial pain: Secondary | ICD-10-CM | POA: Diagnosis not present

## 2022-12-08 DIAGNOSIS — I480 Paroxysmal atrial fibrillation: Secondary | ICD-10-CM

## 2022-12-08 MED ORDER — METOPROLOL TARTRATE 100 MG PO TABS: ORAL_TABLET | ORAL | 0 refills | Status: DC

## 2022-12-08 MED ORDER — ATORVASTATIN CALCIUM 40 MG PO TABS: 40.0000 mg | ORAL_TABLET | Freq: Every day | ORAL | 3 refills | Status: DC

## 2022-12-08 NOTE — Patient Instructions (Addendum)
Medication Instructions:  STOP Pravastatin  START Lipitor 40 mg daily   Take Metoprolol Tartrate two hours before CT when scheduled.   *If you need a refill on your cardiac medications before your next appointment, please call your pharmacy*   Lab Work: BMET today   If you have labs (blood work) drawn today and your tests are completely normal, you will receive your results only by: [place] (if you have MyChart) OR A paper copy in the mail If you have any lab test that is abnormal or we need to change your treatment, we will call you to review the results.   Testing/Procedures: Echocardiogram - Your physician has requested that you have an echocardiogram. Echocardiography is a painless test that uses sound waves to create images of your heart. It provides your doctor with information about the size and shape of your heart and how well your heart's chambers and valves are working. This procedure takes approximately one hour. There are no restrictions for this procedure.     Coronary CTA- they will call you to schedule this.    Follow-Up: At [facility], you and your health needs are our priority.  As part of our continuing mission to provide you with exceptional heart care, we have created designated Provider Care Teams.  These Care Teams include your primary Cardiologist (physician) and Advanced Practice Providers (APPs -  Physician Assistants and Nurse Practitioners) who all work together to provide you with the care you need, when you need it.  We recommend signing up for the patient portal called "MyChart".  Sign up information is provided on this After Visit Summary.  MyChart is used to connect with patients for Virtual Visits (Telemedicine).  Patients are able to view lab/test results, encounter notes, upcoming appointments, etc.  Non-urgent messages can be sent to your provider as well.   To learn more about what you can do with MyChart, go to  [email or web address].    Your next appointment:   6 month(s)  Provider:   Eleonore Chiquito, MD   Other Instructions   Your cardiac CT will be scheduled at one of the below locations:   [facility] [address] [place], [place] [address] [phone]   If scheduled at [facility], please arrive at the [facility] and Children's Entrance (Entrance C2) of [facility] 30 minutes prior to test start time. You can use the FREE valet parking offered at entrance C (encouraged to control the heart rate for the test)  Proceed to the [facility] Radiology Department (first floor) to check-in and test prep.  All radiology patients and guests should use entrance C2 at [facility], accessed from [facility], even though the hospital's physical address listed is [address].     Please follow these instructions carefully (unless otherwise directed):  Hold all erectile dysfunction medications at least 3 days (72 hrs) prior to test. (Ie viagra, cialis, sildenafil, tadalafil, etc) We will administer nitroglycerin during this exam.   On the Night Before the Test: Be sure to Drink plenty of water. Do not consume any caffeinated/decaffeinated beverages or chocolate 12 hours prior to your test. Do not take any antihistamines 12 hours prior to your test.  On the Day of the Test: Drink plenty of water until 1 hour prior to the test. Do not eat any food 1 hour prior to test. You may take your regular medications prior to the test.  Take metoprolol (Lopressor)  two hours prior to test. HOLD Furosemide/Hydrochlorothiazide morning of the test.    After the Test: Drink plenty of water. After receiving IV contrast, you may experience a mild flushed feeling. This is normal. On occasion, you may experience a mild rash up to 24 hours after the test. This is not dangerous. If this occurs, you can take Benadryl 25 mg and increase your fluid  intake. If you experience trouble breathing, this can be serious. If it is severe call 911 IMMEDIATELY. If it is mild, please call our office. If you take any of these medications: Glipizide/Metformin, Avandament, Glucavance, please do not take 48 hours after completing test unless otherwise instructed.  We will call to schedule your test 2-4 weeks out understanding that some insurance companies will need an authorization prior to the service being performed.   For non-scheduling related questions, please contact the cardiac imaging nurse navigator should you have any questions/concerns: Marchia Bond, Cardiac Imaging Nurse Navigator Gordy Clement, Cardiac Imaging Nurse Navigator  Heart and Vascular Services Direct Office Dial: [phone]   For scheduling needs, including cancellations and rescheduling, please call [place], [phone].

## 2022-12-09 LAB — BASIC METABOLIC PANEL
BUN/Creatinine Ratio: 15 (ref 10–24)
BUN: 17 mg/dL (ref 8–27)
CO2: 20 mmol/L (ref 20–29)
Calcium: 9.3 mg/dL (ref 8.6–10.2)
Chloride: 104 mmol/L (ref 96–106)
Creatinine, Ser: 1.14 mg/dL (ref 0.76–1.27)
Glucose: 94 mg/dL (ref 70–99)
Potassium: 4.5 mmol/L (ref 3.5–5.2)
Sodium: 140 mmol/L (ref 134–144)
eGFR: 72 mL/min/{1.73_m2} (ref >59)

## 2022-12-20 ENCOUNTER — Telehealth (HOSPITAL_COMMUNITY): Payer: Self-pay | Admitting: *Deleted

## 2022-12-20 NOTE — Telephone Encounter (Signed)
Reaching out to patient to offer assistance regarding upcoming cardiac imaging study; pt verbalizes understanding of appt date/time, parking situation and where to check in, pre-test NPO status and medications ordered, and verified current allergies; name and call back number provided for further questions should they arise  Parker Clement RN Navigator Cardiac Imaging [facility] [phone] office [phone] cell  Patient to hold his BP medications and take 100mg  metoprolol tartrate two hours prior to his cardiac CT scan. He is aware to arrive at 1:30 pm.

## 2022-12-21 ENCOUNTER — Ambulatory Visit (HOSPITAL_COMMUNITY)
Admission: RE | Admit: 2022-12-21 | Discharge: 2022-12-21 | Disposition: A | Discharge status: Home / Self Care | Payer: Medicare HMO | Source: Ambulatory Visit | Attending: Cardiovascular Disease | Admitting: Cardiovascular Disease

## 2022-12-21 DIAGNOSIS — R0602 Shortness of breath: Secondary | ICD-10-CM | POA: Diagnosis not present

## 2022-12-21 DIAGNOSIS — I2510 Atherosclerotic heart disease of native coronary artery without angina pectoris: Secondary | ICD-10-CM | POA: Diagnosis not present

## 2022-12-21 DIAGNOSIS — R072 Precordial pain: Secondary | ICD-10-CM | POA: Diagnosis present

## 2022-12-21 MED ORDER — IOHEXOL 350 MG/ML SOLN
100.0000 mL | Freq: Once | INTRAVENOUS | Status: AC | PRN
  Administered 2022-12-21: 100 mL via INTRAVENOUS

## 2022-12-21 MED ORDER — NITROGLYCERIN 0.4 MG SL SUBL
0.8000 mg | SUBLINGUAL_TABLET | Freq: Once | SUBLINGUAL | Status: AC
  Administered 2022-12-21: 0.8 mg via SUBLINGUAL

## 2022-12-21 MED ORDER — NITROGLYCERIN 0.4 MG SL SUBL
SUBLINGUAL_TABLET | SUBLINGUAL | Status: AC
  Filled 2022-12-21: qty 2

## 2023-01-02 ENCOUNTER — Ambulatory Visit (HOSPITAL_COMMUNITY): Payer: Medicare HMO | Attending: Cardiology

## 2023-01-02 DIAGNOSIS — R0602 Shortness of breath: Secondary | ICD-10-CM | POA: Diagnosis not present

## 2023-01-02 LAB — ECHOCARDIOGRAM COMPLETE
Area-P 1/2: 1.6 cm2
S' Lateral: 3 cm

## 2023-01-18 ENCOUNTER — Encounter: Payer: Self-pay | Admitting: Radiology

## 2023-01-25 ENCOUNTER — Ambulatory Visit (INDEPENDENT_AMBULATORY_CARE_PROVIDER_SITE_OTHER): Payer: Medicare HMO | Admitting: Physician Assistant

## 2023-01-25 ENCOUNTER — Other Ambulatory Visit (INDEPENDENT_AMBULATORY_CARE_PROVIDER_SITE_OTHER): Payer: Medicare HMO

## 2023-01-25 ENCOUNTER — Encounter: Payer: Self-pay | Admitting: Physician Assistant

## 2023-01-25 DIAGNOSIS — Z96651 Presence of right artificial knee joint: Secondary | ICD-10-CM

## 2023-01-25 NOTE — Progress Notes (Signed)
HPI: Mr. Sare returns today status post right total knee arthroplasty 03/17/2022.  States he is doing okay.  He is a little sore after he hit striking his leg on an object few weeks ago.  But overall having no concerns.  Has occasional aching in the knee.  He is only walking for exercise.  Review of systems see HPI otherwise negative  Physical exam: General well-developed well-nourished male who ambulates with a nonantalgic gait no assistive device.  Right knee: Full extension full flexion right knee.  No instability valgus varus stressing.  No abnormal warmth erythema or effusion.  Surgical incisions well-healed.  Right calf supple nontender.  Radiographs: 2 views right knee show no acute findings.  Right total knee arthroplasty components appear well-seated.  No bony abnormalities.  Impression: Status post right total knee arthroplasty  Plan: He will follow-up with [place] as needed recommend he continue home exercise program as taught by therapy.  Also recommend that he start using a bike to increase his quad strength.

## 2023-03-16 ENCOUNTER — Encounter: Payer: Self-pay | Admitting: *Deleted

## 2023-03-16 DIAGNOSIS — Z006 Encounter for examination for normal comparison and control in clinical research program: Secondary | ICD-10-CM

## 2023-03-16 NOTE — Research (Signed)
Spoke with [name] about V1P states ok to send more information. Emailed a copy of the consent for him to tread over. Encouraged him to call with any questions.

## 2023-03-18 NOTE — Progress Notes (Signed)
Plaque data reviewed for DECIDE registry.   Total plaque volume 344 mm3 (46 calcified; 298 non-calcified, 5 LAP, 46th percentile).   Severe total plaque volume.   CCTA with CAC 258 (74th percentile) and mild CAD (25-49%).   Lipitor 40 mg added to regimen at last appointment as already on zetia.   Did you add a medication? No  If yes, how many? NA  If no, reason? Reason for not adding med: Other Already on meds  Did you remove a medication? No  Did you increase the dosage of any medication? No  Did you decrease the dosage of any medication? No  Did you refer to a specialist (i.e. lipid clinic, preventive cardiology, endocrinology)? No  Has patient seen plaque report? No  [place] T. Flora Lipps, MD, [facility]  [facility]  [address], [address] [place], [place] [address] [phone]  9:23 PM

## 2023-03-19 ENCOUNTER — Encounter: Payer: Self-pay | Admitting: *Deleted

## 2023-03-19 DIAGNOSIS — Z006 Encounter for examination for normal comparison and control in clinical research program: Secondary | ICD-10-CM

## 2023-03-19 NOTE — Research (Signed)
Spoke with Parker Daniel about V1P states he has not had time to read over the information. Encouraged him to call if he has any questions. Voices understanding.

## 2023-03-19 NOTE — Progress Notes (Signed)
Spoke with Parker Daniel about V1P research. States he has not had time to read over the information yet. Encouraged him to call with any questions. Voices understanding.

## 2023-03-22 ENCOUNTER — Encounter: Payer: Self-pay | Admitting: *Deleted

## 2023-03-22 DIAGNOSIS — Z006 Encounter for examination for normal comparison and control in clinical research program: Secondary | ICD-10-CM

## 2023-03-22 NOTE — Research (Signed)
Message left  about V1[employer] research. Encouraged Mr Tylicki to call back if he is interested or has any questions.

## 2023-05-11 ENCOUNTER — Emergency Department (HOSPITAL_COMMUNITY): Payer: Medicare HMO

## 2023-05-11 ENCOUNTER — Other Ambulatory Visit: Payer: Self-pay

## 2023-05-11 ENCOUNTER — Encounter (HOSPITAL_COMMUNITY): Payer: Self-pay

## 2023-05-11 ENCOUNTER — Telehealth: Payer: Self-pay | Admitting: Cardiovascular Disease

## 2023-05-11 ENCOUNTER — Observation Stay (HOSPITAL_COMMUNITY)
Admission: EM | Admit: 2023-05-11 | Discharge: 2023-05-12 | Disposition: A | Discharge status: Home / Self Care | Payer: Medicare HMO | Attending: Internal Medicine | Admitting: Internal Medicine

## 2023-05-11 DIAGNOSIS — Z96651 Presence of right artificial knee joint: Secondary | ICD-10-CM | POA: Insufficient documentation

## 2023-05-11 DIAGNOSIS — E872 Acidosis, unspecified: Secondary | ICD-10-CM

## 2023-05-11 DIAGNOSIS — R519 Headache, unspecified: Secondary | ICD-10-CM | POA: Insufficient documentation

## 2023-05-11 DIAGNOSIS — I4891 Unspecified atrial fibrillation: Secondary | ICD-10-CM

## 2023-05-11 DIAGNOSIS — E1110 Type 2 diabetes mellitus with ketoacidosis without coma: Secondary | ICD-10-CM | POA: Diagnosis not present

## 2023-05-11 DIAGNOSIS — Z7901 Long term (current) use of anticoagulants: Secondary | ICD-10-CM | POA: Diagnosis not present

## 2023-05-11 DIAGNOSIS — I2510 Atherosclerotic heart disease of native coronary artery without angina pectoris: Secondary | ICD-10-CM | POA: Insufficient documentation

## 2023-05-11 DIAGNOSIS — R0602 Shortness of breath: Secondary | ICD-10-CM | POA: Diagnosis present

## 2023-05-11 DIAGNOSIS — I480 Paroxysmal atrial fibrillation: Secondary | ICD-10-CM | POA: Diagnosis not present

## 2023-05-11 DIAGNOSIS — I10 Essential (primary) hypertension: Secondary | ICD-10-CM | POA: Diagnosis not present

## 2023-05-11 DIAGNOSIS — I959 Hypotension, unspecified: Secondary | ICD-10-CM | POA: Insufficient documentation

## 2023-05-11 DIAGNOSIS — N179 Acute kidney failure, unspecified: Principal | ICD-10-CM | POA: Insufficient documentation

## 2023-05-11 DIAGNOSIS — Z79899 Other long term (current) drug therapy: Secondary | ICD-10-CM | POA: Diagnosis not present

## 2023-05-11 DIAGNOSIS — F1190 Opioid use, unspecified, uncomplicated: Secondary | ICD-10-CM

## 2023-05-11 DIAGNOSIS — Z96642 Presence of left artificial hip joint: Secondary | ICD-10-CM | POA: Diagnosis not present

## 2023-05-11 LAB — CBC WITH DIFFERENTIAL/PLATELET
Abs Immature Granulocytes: 0.03 10*3/uL (ref 0.00–0.07)
Basophils Absolute: 0 10*3/uL (ref 0.0–0.1)
Basophils Relative: 0 %
Eosinophils Absolute: 0 10*3/uL (ref 0.0–0.5)
Eosinophils Relative: 0 %
HCT: 44.2 % (ref 39.0–52.0)
Hemoglobin: 14.1 g/dL (ref 13.0–17.0)
Immature Granulocytes: 0 %
Lymphocytes Relative: 14 %
Lymphs Abs: 1.5 10*3/uL (ref 0.7–4.0)
MCH: 30.1 pg (ref 26.0–34.0)
MCHC: 31.9 g/dL (ref 30.0–36.0)
MCV: 94.2 fL (ref 80.0–100.0)
Monocytes Absolute: 0.9 10*3/uL (ref 0.1–1.0)
Monocytes Relative: 9 %
Neutro Abs: 8.1 10*3/uL — ABNORMAL HIGH (ref 1.7–7.7)
Neutrophils Relative %: 77 %
Platelets: 229 10*3/uL (ref 150–400)
RBC: 4.69 MIL/uL (ref 4.22–5.81)
RDW: 14 % (ref 11.5–15.5)
WBC: 10.6 10*3/uL — ABNORMAL HIGH (ref 4.0–10.5)
nRBC: 0 % (ref 0.0–0.2)

## 2023-05-11 LAB — I-STAT CHEM 8, ED
BUN: 26 mg/dL — ABNORMAL HIGH (ref 8–23)
Calcium, Ion: 1.22 mmol/L (ref 1.15–1.40)
Chloride: 108 mmol/L (ref 98–111)
Creatinine, Ser: 2.5 mg/dL — ABNORMAL HIGH (ref 0.61–1.24)
Glucose, Bld: 109 mg/dL — ABNORMAL HIGH (ref 70–99)
HCT: 47 % (ref 39.0–52.0)
Hemoglobin: 16 g/dL (ref 13.0–17.0)
Potassium: 4.7 mmol/L (ref 3.5–5.1)
Sodium: 141 mmol/L (ref 135–145)
TCO2: 21 mmol/L — ABNORMAL LOW (ref 22–32)

## 2023-05-11 LAB — LACTIC ACID, PLASMA
Lactic Acid, Venous: 3 mmol/L (ref 0.5–1.9)
Lactic Acid, Venous: 1.5 mmol/L (ref 0.5–1.9)

## 2023-05-11 LAB — COMPREHENSIVE METABOLIC PANEL
ALT: 27 U/L (ref 0–44)
AST: 26 U/L (ref 15–41)
Albumin: 4.6 g/dL (ref 3.5–5.0)
Alkaline Phosphatase: 65 U/L (ref 38–126)
Anion gap: 13 (ref 5–15)
BUN: 20 mg/dL (ref 8–23)
CO2: 18 mmol/L — ABNORMAL LOW (ref 22–32)
Calcium: 9.8 mg/dL (ref 8.9–10.3)
Chloride: 107 mmol/L (ref 98–111)
Creatinine, Ser: 2.35 mg/dL — ABNORMAL HIGH (ref 0.61–1.24)
GFR, Estimated: 30 mL/min — ABNORMAL LOW (ref >60)
Glucose, Bld: 106 mg/dL — ABNORMAL HIGH (ref 70–99)
Potassium: 3.7 mmol/L (ref 3.5–5.1)
Sodium: 138 mmol/L (ref 135–145)
Total Bilirubin: 1.7 mg/dL — ABNORMAL HIGH (ref 0.3–1.2)
Total Protein: 8 g/dL (ref 6.5–8.1)

## 2023-05-11 LAB — BRAIN NATRIURETIC PEPTIDE: B Natriuretic Peptide: 121.9 pg/mL — ABNORMAL HIGH (ref 0.0–100.0)

## 2023-05-11 LAB — URINALYSIS, W/ REFLEX TO CULTURE (INFECTION SUSPECTED)
Bacteria, UA: NONE SEEN
Bilirubin Urine: NEGATIVE
Glucose, UA: NEGATIVE mg/dL
Hgb urine dipstick: NEGATIVE
Ketones, ur: NEGATIVE mg/dL
Leukocytes,Ua: NEGATIVE
Nitrite: NEGATIVE
Protein, ur: 30 mg/dL — AB
Specific Gravity, Urine: >1.046 — ABNORMAL HIGH (ref 1.005–1.030)
pH: 5 (ref 5.0–8.0)

## 2023-05-11 LAB — RAPID URINE DRUG SCREEN, HOSP PERFORMED
Amphetamines: NOT DETECTED
Barbiturates: NOT DETECTED
Benzodiazepines: NOT DETECTED
Cocaine: NOT DETECTED
Opiates: NOT DETECTED
Tetrahydrocannabinol: NOT DETECTED

## 2023-05-11 LAB — CBG MONITORING, ED: Glucose-Capillary: 108 mg/dL — ABNORMAL HIGH (ref 70–99)

## 2023-05-11 LAB — TROPONIN I (HIGH SENSITIVITY)
Troponin I (High Sensitivity): 8 ng/L (ref <18)
Troponin I (High Sensitivity): 6 ng/L (ref <18)

## 2023-05-11 LAB — TSH: TSH: 0.682 u[IU]/mL (ref 0.350–4.500)

## 2023-05-11 LAB — T4, FREE: Free T4: 0.85 ng/dL (ref 0.61–1.12)

## 2023-05-11 LAB — MAGNESIUM: Magnesium: 2.3 mg/dL (ref 1.7–2.4)

## 2023-05-11 LAB — CK: Total CK: 216 U/L (ref 49–397)

## 2023-05-11 MED ORDER — GABAPENTIN 300 MG PO CAPS
600.0000 mg | ORAL_CAPSULE | Freq: Three times a day (TID) | ORAL | Status: DC
  Administered 2023-05-11 – 2023-05-12 (×2): 600 mg via ORAL
  Filled 2023-05-11 (×2): qty 2

## 2023-05-11 MED ORDER — IOHEXOL 350 MG/ML SOLN
80.0000 mL | Freq: Once | INTRAVENOUS | Status: AC | PRN
  Administered 2023-05-11: 80 mL via INTRAVENOUS

## 2023-05-11 MED ORDER — SORBITOL 70 % SOLN: 30.0000 mL | Freq: Every day | Status: DC | PRN

## 2023-05-11 MED ORDER — APIXABAN 5 MG PO TABS
5.0000 mg | ORAL_TABLET | Freq: Two times a day (BID) | ORAL | Status: DC
  Administered 2023-05-11 – 2023-05-12 (×2): 5 mg via ORAL
  Filled 2023-05-11 (×2): qty 1

## 2023-05-11 MED ORDER — METRONIDAZOLE 500 MG/100ML IV SOLN
500.0000 mg | Freq: Once | INTRAVENOUS | Status: AC
  Administered 2023-05-11: 500 mg via INTRAVENOUS
  Filled 2023-05-11: qty 100

## 2023-05-11 MED ORDER — SODIUM CHLORIDE 0.9 % IV SOLN
2.0000 g | Freq: Once | INTRAVENOUS | Status: AC
  Administered 2023-05-11: 2 g via INTRAVENOUS
  Filled 2023-05-11: qty 12.5

## 2023-05-11 MED ORDER — TRAZODONE HCL 50 MG PO TABS
50.0000 mg | ORAL_TABLET | Freq: Every day | ORAL | Status: DC
  Administered 2023-05-11: 50 mg via ORAL
  Filled 2023-05-11: qty 1

## 2023-05-11 MED ORDER — SODIUM CHLORIDE 0.9 % IV BOLUS (SEPSIS)
1000.0000 mL | Freq: Once | INTRAVENOUS | Status: AC
  Administered 2023-05-11: 1000 mL via INTRAVENOUS

## 2023-05-11 MED ORDER — OXYCODONE HCL 5 MG PO TABS: 15.0000 mg | ORAL_TABLET | Freq: Two times a day (BID) | ORAL | Status: DC | PRN

## 2023-05-11 MED ORDER — OXYCODONE HCL ER 20 MG PO T12A
20.0000 mg | EXTENDED_RELEASE_TABLET | Freq: Two times a day (BID) | ORAL | Status: DC
  Administered 2023-05-11 – 2023-05-12 (×2): 20 mg via ORAL
  Filled 2023-05-11 (×2): qty 1

## 2023-05-11 MED ORDER — LACTATED RINGERS IV SOLN: INTRAVENOUS | Status: DC

## 2023-05-11 MED ORDER — ALLOPURINOL 300 MG PO TABS: 300.0000 mg | ORAL_TABLET | Freq: Every day | ORAL | Status: DC

## 2023-05-11 MED ORDER — PREGABALIN 75 MG PO CAPS
75.0000 mg | ORAL_CAPSULE | Freq: Three times a day (TID) | ORAL | Status: DC
  Administered 2023-05-11 – 2023-05-12 (×2): 75 mg via ORAL
  Filled 2023-05-11 (×2): qty 1

## 2023-05-11 MED ORDER — VANCOMYCIN HCL 2000 MG/400ML IV SOLN
2000.0000 mg | Freq: Once | INTRAVENOUS | Status: AC
  Administered 2023-05-11: 2000 mg via INTRAVENOUS
  Filled 2023-05-11: qty 400

## 2023-05-11 MED ORDER — LACTATED RINGERS IV BOLUS
1000.0000 mL | Freq: Once | INTRAVENOUS | Status: AC
  Administered 2023-05-11: 1000 mL via INTRAVENOUS

## 2023-05-11 NOTE — ED Provider Notes (Signed)
[place] EMERGENCY DEPARTMENT AT [facility] Provider Note   CSN: [id number] Arrival date & time: 05/11/23  1515     History {Add pertinent medical, surgical, social history, OB history to HPI:1} Chief Complaint  Patient presents with   Head Injury   Hypotension   Blurred Vision   Chest Pain   Shortness of Breath    Parker Daniel is a 67 y.o. male.  HPI     67 year old male with a history of diabetes, coronary artery disease hypertension, hyperlipidemia, atrial fibrillation on Eliquis, osteoarthritis s/p knee arthroplasty, who presents with concern for fall, lightheadedness, chest pain, blurred vision and hypotension.  Reports when he woke up this morning, he did not quite feel right, felt a little bit lightheaded.  He ended up hitting his head on the on the door and he passed out.  He is not sure how long he was out.  He reports his dog woke him up by looking him, he decided to go to work after and stated he felt lightheaded, he had 2 episodes of vomiting, had chest pain, blurred vision, reports headache and feeling off balance.  Denies any fever, preceding nausea and vomiting prior to the fall, diarrhea, black or bloody stools.  Reports he has had some difficulty urinating.  Reports a chest pain that feels more like a pressure in the middle of his chest, does have dyspnea.  Feels lightheaded.  Feels weak all over, but denies focal numbness or weakness, double vision, difficulty talking.   Past Medical History:  Diagnosis Date   Arthritis    Coronary artery disease    Diabetes mellitus without complication (HCC)    Dyspnea    with exertion   GERD (gastroesophageal reflux disease)    Gout    History of kidney stones    Hyperlipidemia    Hypertension    Pneumonia    10/2021     Home Medications Prior to Admission medications   Medication Sig Start Date End Date Taking? Authorizing Provider  acetaminophen (TYLENOL) 500 MG tablet Take 1,000 mg by mouth  every 8 (eight) hours as needed for moderate pain.    [provider]  allopurinol (ZYLOPRIM) 300 MG tablet Take 300 mg by mouth daily at 6 PM. Pt takes at 6 pm at nite    [provider]  amoxicillin (AMOXIL) 500 MG capsule Take 500 mg by mouth 2 (two) times daily. 11/23/22   [provider]  atorvastatin (LIPITOR) 40 MG tablet Take 1 tablet (40 mg total) by mouth daily. 12/08/22 12/03/23  O'NealRonnald Ramp, MD  Calcium Carb-Cholecalciferol 500-10 MG-MCG TABS Take 1 tablet by mouth daily.    [provider]  Cholecalciferol (VITAMIN D) 50 MCG (2000 UT) CAPS Take 4,000 Units by mouth daily.    [provider]  ELIQUIS 5 MG TABS tablet Take 1 tablet (5 mg total) by mouth 2 (two) times daily. 01/26/22   Jerilee Field, MD  ezetimibe (ZETIA) 10 MG tablet Take 1 tablet by mouth daily. 08/30/22 08/30/23  [provider]  gabapentin (NEURONTIN) 300 MG capsule Take 600 mg by mouth 3 (three) times daily.    [provider]  HYDROmorphone (DILAUDID) 4 MG tablet Take 1 tablet (4 mg total) by mouth every 4 (four) hours as needed for severe pain. 04/11/22   Kathryne Hitch, MD  ibuprofen (ADVIL) 200 MG tablet Take 400 mg by mouth every 8 (eight) hours as needed for moderate pain.  [provider]  losartan (COZAAR) 50 MG tablet 50 mg daily. 08/30/22 08/30/23  [provider]  metoprolol succinate (TOPROL-XL) 25 MG 24 hr tablet Take 25 mg by mouth in the morning. 01/12/22   [provider]  metoprolol tartrate (LOPRESSOR) 100 MG tablet Take 1 tablet by mouth once for procedure. 12/08/22   O'NealRonnald Ramp, MD  MOUNJARO 2.5 MG/0.5ML Pen Inject into the skin.    [provider]  MULTAQ 400 MG tablet Take 400 mg by mouth 2 (two) times daily. Pt takes at 6pm and 6 am 11/22/21   [provider]  Multiple Vitamins-Minerals (MULTIVITAMIN ADULT) CHEW Chew 1 each by mouth daily. Sugar Free    [provider]  oxyCODONE (ROXICODONE) 15 MG immediate release tablet Take 15 mg by mouth 2 (two) times daily as needed for pain. 12/02/21   [provider]  senna (SENOKOT) 8.6 MG tablet Take 2-3 tablets by mouth daily as needed for constipation.    [provider]  tamsulosin (FLOMAX) 0.4 MG CAPS capsule Take 0.4 mg by mouth at bedtime. 9 pm 01/03/22   [provider]  testosterone cypionate (DEPOTESTOSTERONE CYPIONATE) 200 MG/ML injection Inject 200 mg into the muscle every 21 ( twenty-one) days.    [provider]  tiZANidine (ZANAFLEX) 4 MG tablet Take 1 tablet (4 mg total) by mouth every 6 (six) hours as needed for muscle spasms. 04/11/22   Kathryne Hitch, MD  traZODone (DESYREL) 50 MG tablet Take 50 mg by mouth at bedtime.    [provider]  vitamin C (ASCORBIC ACID) 250 MG tablet Take 500 mg by mouth daily.    [provider]  XTAMPZA ER 18 MG C12A Take 18 mg by mouth every 12 (twelve) hours. Pt takes at 6pm and 6 am 12/31/21   [provider]      Allergies    Atorvastatin and Rosuvastatin    Review of Systems   Review of Systems  Physical Exam Updated Vital Signs BP (!) 74/49 (BP Location: Left Arm)   Pulse 85   Temp 98.2 F (36.8 C) (Oral)   Resp (!) 24   SpO2 95%  Physical Exam  ED Results / Procedures / Treatments   Labs (all labs ordered are listed, but only abnormal results are displayed) Labs Reviewed  CBG MONITORING, ED - Abnormal; Notable for the following components:      Result Value   Glucose-Capillary 108 (*)    All other components within normal limits  CULTURE, BLOOD (ROUTINE X 2)  CULTURE, BLOOD (ROUTINE X 2)  CBC WITH DIFFERENTIAL/PLATELET  COMPREHENSIVE METABOLIC PANEL  TSH  T4, FREE  LACTIC ACID, PLASMA  LACTIC ACID, PLASMA  BRAIN NATRIURETIC PEPTIDE  MAGNESIUM  URINALYSIS, W/ REFLEX TO CULTURE (INFECTION SUSPECTED)  RAPID URINE DRUG SCREEN, HOSP PERFORMED  I-STAT CHEM 8, ED   TYPE AND SCREEN  TROPONIN I (HIGH SENSITIVITY)    EKG EKG Interpretation Date/Time:  Friday May 11 2023 15:25:39 EDT Ventricular Rate:  89 PR Interval:    QRS Duration:  100 QT Interval:  374 QTC Calculation: 456 R Axis:   17  Text Interpretation: Atrial fibrillation Borderline T abnormalities, inferior leads Since prior ECG, rhythm now atrial fibrillation Confirmed by Alvira Monday ([id number]) on 05/11/2023 3:32:51 PM  Radiology No results found.  Procedures Procedures  {Document cardiac monitor, telemetry assessment procedure when appropriate:1}  Medications Ordered in ED Medications  lactated ringers bolus 1,000 mL (has no administration  in time range)    ED Course/ Medical Decision Making/ A&P   {   Click here for ABCD2, HEART and other calculatorsREFRESH Note before signing :1}                            67 year old male with a history of diabetes, coronary artery disease hypertension, hyperlipidemia, atrial fibrillation on Eliquis, osteoarthritis s/p knee arthroplasty, who presents with concern for fall, lightheadedness, chest pain, blurred vision and hypotension.  Differential diagnosis for hypotension includes sepsis, medication effect, anemia/retroperitoneal hemorrhage in the setting of trauma, ACS, aortic dissection, PE, spinal shock, pneumothorax or hemothorax in the setting of trauma, dehydration, myxedema coma.   Initial EKG completed and personally about interpreted by me shows atrial fibrillation without acute ST changes.  Glucose within normal limits.  Portable chest x-ray was obtained and personally eval and interpreted by me and shows no evidence of pneumothorax or pneumonia.  I-STAT Chem-8 was completed which showed a creatinine of 2.5 from previous of 1.4.  Suspect this elevation is in the setting of hypotension.  Given chest pain, hypotension, fall with head trauma, neck pain on anticoagulation, ordered CT head, cervical spine, chest abdomen pelvis  dissection study and discussed risks of contrast in the setting of renal disease.  CT is completed and personally abided by me and radiology showed no evidence of intracranial bleed, no evidence of cervical spine fracture, CTA does not show signs of dissection or pulmonary embolus, does show enlarged main pulmonary artery suggestive of pulmonary pretension, but shows indeterminate 2.1 cm right renal lesion.   {Document critical care time when appropriate:1} {Document review of labs and clinical decision tools ie heart score, Chads2Vasc2 etc:1}  {Document your independent review of radiology images, and any outside records:1} {Document your discussion with family members, caretakers, and with consultants:1} {Document social determinants of health affecting pt's care:1} {Document your decision making why or why not admission, treatments were needed:1} Final Clinical Impression(s) / ED Diagnoses Final diagnoses:  None    Rx / DC Orders ED Discharge Orders     None

## 2023-05-11 NOTE — ED Notes (Signed)
C-collar in place by RN lindsey

## 2023-05-11 NOTE — ED Triage Notes (Addendum)
Pt taken back to [facility]. Presents Hypotensive.   Arrived with wife at bedside. Patient reports he was walking out this morning approximately  9am reports he hit himself in the head with the door and passed out. Pt was home alone, not sure how long he was out. Reports dog woke him up eventually by licking him. Patient states he decided to go to work after and started to feel "off". States he had 2 episodes of vomiting, chest pain, sob and blurred vision while at work which is why he came in. Endorses "mild headache" and feels off balance. Patient has hx of A-fib. States he called cardiologist to inform of symptoms and advised to come to emergency room.  Hematoma noted of left side of head.

## 2023-05-11 NOTE — Progress Notes (Signed)
A consult was received from an ED physician for Cefepime and Vancomycin per pharmacy dosing.  The patient's profile has been reviewed for ht/wt/allergies/indication/available labs.   Most recent 261 lbs (118 kg) from 12/08/22  A one time order has been placed for Cefepime 2g, Vancomycin 2g.  Further antibiotics/pharmacy consults should be ordered by admitting physician if indicated.                       Thank you,  Lynann Beaver PharmD, BCPS [facility] main pharmacy [phone] 05/11/2023 6:10 PM

## 2023-05-11 NOTE — Telephone Encounter (Signed)
Spoke to the pt, explained Dr. Flora Lipps recommendation :   Agree. ER is what he needs.   Pt stated he is currently on his way to the ED, Pt voiced understanding.

## 2023-05-11 NOTE — Telephone Encounter (Signed)
Spoke to the pt wife Marjean Donna DPR, pt c/o lightheaded, blurry vision,hypotension mild chest pain and shortness of  breath since this morning. Advised pt and wife to call 911 or immediately go to the nearest emergency room . Pt and his wife voiced understanding. Will forward to MD and nurse.  Blood pressure  6/28  74/79 79/41 70/41  73/77 71/44 1415

## 2023-05-11 NOTE — ED Notes (Signed)
MD at bedside.  IV lines initiated.

## 2023-05-11 NOTE — Telephone Encounter (Signed)
Pt c/o BP issue: STAT if pt c/o blurred vision, one-sided weakness or slurred speech  1. What are your last 5 BP readings?   79/41 73/4 71/46  2. Are you having any other symptoms (ex. Dizziness, headache, blurred vision, passed out)?   Dizziness, SOB, blurred vision  3. What is your BP issue?   Wife is concerned on next steps for patient.

## 2023-05-11 NOTE — ED Notes (Signed)
ED TO INPATIENT HANDOFF REPORT  Name/Age/Gender Parker Daniel 67 y.o. male  Code Status    Code Status Orders  (From admission, onward)           Start     Ordered   05/11/23 1855  Full code  Continuous       Question:  By:  Answer:  Consent: discussion documented in EHR   05/11/23 1856           Code Status History     Date Active Date Inactive Code Status Order ID Comments User Context   03/17/2022 1654 03/20/2022 1747 Full Code [id number]  Kathryne Hitch, MD Inpatient   06/27/2019 1111 06/29/2019 1450 Full Code [id number]  Kathryne Hitch, MD Inpatient       Home/SNF/Other Home  Chief Complaint Acute kidney injury [facility]) [N17.9]  Level of Care/Admitting Diagnosis ED Disposition     ED Disposition  Admit   Condition  --   Comment  Hospital Area: [facility] [[id number]]  Level of Care: Telemetry [5]  Admit to tele based on following criteria: Other see comments  Comments: hypotension  May admit patient to [facility] or [facility] if equivalent level of care is available:: Yes  Covid Evaluation: Asymptomatic - no recent exposure (last 10 days) testing not required  Diagnosis: Acute kidney injury [facility]) [[id number]]  Admitting Physician: Buena Irish [[id number]]  Attending Physician: Buena Irish [[id number]]  Certification:: I certify this patient will need inpatient services for at least 2 midnights  Estimated Length of Stay: 2          Medical History Past Medical History:  Diagnosis Date   Arthritis    Coronary artery disease    Diabetes mellitus without complication (HCC)    Dyspnea    with exertion   GERD (gastroesophageal reflux disease)    Gout    History of kidney stones    Hyperlipidemia    Hypertension    Pneumonia    10/2021    Allergies Allergies  Allergen Reactions   Atorvastatin Other (See Comments)    Other reaction(s): Myalgia Takes pravastatin at home   Rosuvastatin Other (See Comments)     Other reaction(s): Myalgia Takes pravastatin at home    IV Location/Drains/Wounds Patient Lines/Drains/Airways Status     Active Line/Drains/Airways     Name Placement date Placement time Site Days   Peripheral IV 05/11/23 20 G Left Antecubital 05/11/23  1555  Antecubital  less than 1   Peripheral IV 05/11/23 20 G 1" Posterior;Right Hand 05/11/23  1621  Hand  less than 1   Peripheral IV 05/11/23 18 G Anterior;Right Forearm 05/11/23  1622  Forearm  less than 1   Ureteral Drain/Stent Right ureter 6 Fr. 01/24/22  --  Right ureter  472            Labs/Imaging Results for orders placed or performed during the hospital encounter of 05/11/23 (from the past 48 hour(s))  CBG monitoring, ED     Status: Abnormal   Collection Time: 05/11/23  3:39 PM  Result Value Ref Range   Glucose-Capillary 108 (H) 70 - 99 mg/dL    Comment: Glucose reference range applies only to samples taken after fasting for at least 8 hours.  CBC with Differential     Status: Abnormal   Collection Time: 05/11/23  3:46 PM  Result Value Ref Range   WBC 10.6 (H) 4.0 - 10.5 K/uL   RBC 4.69 4.22 -  5.81 MIL/uL   Hemoglobin 14.1 13.0 - 17.0 g/dL   HCT 25.9 56.3 - 87.5 %   MCV 94.2 80.0 - 100.0 fL   MCH 30.1 26.0 - 34.0 pg   MCHC 31.9 30.0 - 36.0 g/dL   RDW 64.3 32.9 - 51.8 %   Platelets 229 150 - 400 K/uL   nRBC 0.0 0.0 - 0.2 %   Neutrophils Relative % 77 %   Neutro Abs 8.1 (H) 1.7 - 7.7 K/uL   Lymphocytes Relative 14 %   Lymphs Abs 1.5 0.7 - 4.0 K/uL   Monocytes Relative 9 %   Monocytes Absolute 0.9 0.1 - 1.0 K/uL   Eosinophils Relative 0 %   Eosinophils Absolute 0.0 0.0 - 0.5 K/uL   Basophils Relative 0 %   Basophils Absolute 0.0 0.0 - 0.1 K/uL   Immature Granulocytes 0 %   Abs Immature Granulocytes 0.03 0.00 - 0.07 K/uL    Comment: Performed at [facility], [address], [place], [place] [address]  Comprehensive metabolic panel     Status: Abnormal   Collection Time: 05/11/23  3:46  PM  Result Value Ref Range   Sodium 138 135 - 145 mmol/L   Potassium 3.7 3.5 - 5.1 mmol/L   Chloride 107 98 - 111 mmol/L   CO2 18 (L) 22 - 32 mmol/L   Glucose, Bld 106 (H) 70 - 99 mg/dL    Comment: Glucose reference range applies only to samples taken after fasting for at least 8 hours.   BUN 20 8 - 23 mg/dL   Creatinine, Ser 0.63 (H) 0.61 - 1.24 mg/dL   Calcium 9.8 8.9 - 01.6 mg/dL   Total Protein 8.0 6.5 - 8.1 g/dL   Albumin 4.6 3.5 - 5.0 g/dL   AST 26 15 - 41 U/L   ALT 27 0 - 44 U/L   Alkaline Phosphatase 65 38 - 126 U/L   Total Bilirubin 1.7 (H) 0.3 - 1.2 mg/dL   GFR, Estimated 30 (L) >60 mL/min    Comment: (NOTE) Calculated using the CKD-EPI Creatinine Equation (2021)    Anion gap 13 5 - 15    Comment: Performed at [facility], [address], [place], [place] [address]  Troponin I (High Sensitivity)     Status: None   Collection Time: 05/11/23  3:46 PM  Result Value Ref Range   Troponin I (High Sensitivity) 8 <18 ng/L    Comment: (NOTE) Elevated high sensitivity troponin I (hsTnI) values and significant  changes across serial measurements may suggest ACS but many other  chronic and acute conditions are known to elevate hsTnI results.  Refer to the "Links" section for chest pain algorithms and additional  guidance. Performed at [facility], [address] [place], [place] [address]   Magnesium     Status: None   Collection Time: 05/11/23  3:46 PM  Result Value Ref Range   Magnesium 2.3 1.7 - 2.4 mg/dL    Comment: Performed at [facility], [address], [place], [place] [address]  TSH     Status: None   Collection Time: 05/11/23  3:47 PM  Result Value Ref Range   TSH 0.682 0.350 - 4.500 uIU/mL    Comment: Performed by a 3rd Generation assay with a functional sensitivity of <=0.01 uIU/mL. Performed at [facility], [address] [place], [place] [address]   Lactic acid, plasma      Status: Abnormal  Collection Time: 05/11/23  3:51 PM  Result Value Ref Range   Lactic Acid, Venous 3.0 (HH) 0.5 - 1.9 mmol/L    Comment: CRITICAL RESULT CALLED TO, READ BACK BY AND VERIFIED WITH rn l Ronalda Walpole at 1800 05/11/23 [email or web address] a Performed at [facility], [address], [place], [place] [address]   I-stat chem 8, ED (not at [facility], [facility] or [facility])     Status: Abnormal   Collection Time: 05/11/23  4:09 PM  Result Value Ref Range   Sodium 141 135 - 145 mmol/L   Potassium 4.7 3.5 - 5.1 mmol/L   Chloride 108 98 - 111 mmol/L   BUN 26 (H) 8 - 23 mg/dL   Creatinine, Ser 4.74 (H) 0.61 - 1.24 mg/dL   Glucose, Bld 259 (H) 70 - 99 mg/dL    Comment: Glucose reference range applies only to samples taken after fasting for at least 8 hours.   Calcium, Ion 1.22 1.15 - 1.40 mmol/L   TCO2 21 (L) 22 - 32 mmol/L   Hemoglobin 16.0 13.0 - 17.0 g/dL   HCT 56.3 87.5 - 64.3 %  Lactic acid, plasma     Status: None   Collection Time: 05/11/23  5:55 PM  Result Value Ref Range   Lactic Acid, Venous 1.5 0.5 - 1.9 mmol/L    Comment: Performed at [facility], [address] [place], [place] [address]   CT Angio Chest/Abd/Pel for Dissection W and/or Wo Contrast  Result Date: 05/11/2023 CLINICAL DATA:  Acute aortic syndrome (AAS) suspected hypotension, chest pain, fall 9am reports he hit himself in the head with the door and passed out. Pt was home alone, not sure how long he was out. EXAM: CT ANGIOGRAPHY CHEST, ABDOMEN AND PELVIS TECHNIQUE: Non-contrast CT of the chest was initially obtained. Multidetector CT imaging through the chest, abdomen and pelvis was performed using the standard protocol during bolus administration of intravenous contrast. Multiplanar reconstructed images and MIPs were obtained and reviewed to evaluate the vascular anatomy. RADIATION DOSE REDUCTION: This exam was performed according to the departmental dose-optimization program which includes  automated exposure control, adjustment of the mA and/or kV according to patient size and/or use of iterative reconstruction technique. CONTRAST:  80mL OMNIPAQUE IOHEXOL 350 MG/ML SOLN COMPARISON:  None Available. FINDINGS: CTA CHEST FINDINGS Cardiovascular: Preferential opacification of the thoracic aorta. No aortic injury. No evidence of thoracic aortic aneurysm or dissection. Normal heart size. No pericardial effusion. Left anterior descending coronary calcification. Enlarged main pulmonary caliber measuring up to 3.5 cm. No pulmonary embolus Mediastinum/Nodes: No pneumomediastinum. No mediastinal hematoma. The esophagus is unremarkable. The thyroid is unremarkable. The central airways are patent. No mediastinal, hilar, or axillary lymphadenopathy. Lungs/Pleura: Azygous fissure noted. Bilateral lower lobe atelectasis. No focal consolidation. No pulmonary nodule. No pulmonary mass. No pulmonary contusion or laceration. No pneumatocele formation. No pleural effusion. No pneumothorax. No hemothorax. Musculoskeletal/Chest wall: No chest wall mass. No acute rib or sternal fracture. No spinal fracture. Review of the MIP images confirms the above findings. CTA ABDOMEN AND PELVIS FINDINGS VASCULAR Aorta: Mild atherosclerotic plaque. No active contrast extravasation or pseudoaneurysm. Normal caliber aorta without aneurysm, dissection, vasculitis or significant stenosis. Celiac: Patent without evidence of aneurysm, dissection, vasculitis or significant stenosis. SMA: Mild atherosclerotic plaque. Patent without evidence of aneurysm, dissection, vasculitis or significant stenosis. Renals: Mild atherosclerotic plaque. Both renal arteries are patent without evidence of aneurysm, dissection, vasculitis, fibromuscular dysplasia or significant stenosis. IMA: Patent without evidence of aneurysm, dissection, vasculitis or  significant stenosis. Inflow: Mild atherosclerotic plaque. Patent without evidence of aneurysm, dissection,  vasculitis or significant stenosis. Veins: No obvious venous abnormality within the limitations of this arterial phase study. Review of the MIP images confirms the above findings. NON-VASCULAR ABDOMEN / PELVIS: Hepatobiliary: Not enlarged. No focal lesion. No laceration or subcapsular hematoma. Status post cholecystectomy.  No biliary ductal dilatation. Pancreas: Normal pancreatic contour. No main pancreatic duct dilatation. Spleen: Not enlarged. No focal lesion. No laceration, subcapsular hematoma, or vascular injury. Adrenals/Urinary Tract: No nodularity bilaterally. Bilateral kidneys enhance symmetrically. A hypodense 2.1 x 1.7 cm right renal lesion with a density of 38 Hounsfield units. No hydronephrosis. No contusion, laceration, or subcapsular hematoma. No injury to the vascular structures or collecting systems. No hydroureter. The urinary bladder is unremarkable. Stomach/Bowel: No small or large bowel wall thickening or dilatation. The appendix is unremarkable. Lymphatics: No abdominal, pelvic, inguinal lymphadenopathy. Reproductive: Normal. Other: No simple free fluid ascites. No pneumoperitoneum. No hemoperitoneum. No mesenteric hematoma identified. No organized fluid collection. Musculoskeletal: No significant soft tissue hematoma. No acute pelvic fracture. No spinal fracture. Total left hip arthroplasty. Ports and Devices: None. Review of the MIP images confirms the above findings. IMPRESSION: 1. No acute thoracic or abdominal aorta abnormality. 2. No pulmonary embolus. 3. Enlarged main pulmonary artery suggestive of pulmonary hypertension. 4. No acute intrathoracic, intra-abdominal, intrapelvic traumatic injury. 5. No acute fracture or traumatic malalignment of the thoracic or lumbar spine. 6. Indeterminate 2.1 cm right renal lesion. When the patient is clinically stable and able to follow directions and hold their breath (preferably as an outpatient) further evaluation with dedicated MRI renal protocol  should be considered. Electronically Signed   By: Tish Frederickson M.D.   On: 05/11/2023 17:04   CT Head Wo Contrast  Result Date: 05/11/2023 CLINICAL DATA:  Facial trauma, blunt; Neck trauma (Age >= 65y) EXAM: CT HEAD WITHOUT CONTRAST CT CERVICAL SPINE WITHOUT CONTRAST TECHNIQUE: Multidetector CT imaging of the head and cervical spine was performed following the standard protocol without intravenous contrast. Multiplanar CT image reconstructions of the cervical spine were also generated. RADIATION DOSE REDUCTION: This exam was performed according to the departmental dose-optimization program which includes automated exposure control, adjustment of the mA and/or kV according to patient size and/or use of iterative reconstruction technique. COMPARISON:  None Available. FINDINGS: CT HEAD FINDINGS Brain: No evidence of acute infarction, hemorrhage, hydrocephalus, extra-axial collection or mass lesion/mass effect. Vascular: No hyperdense vessel identified. Skull: No acute fracture. Sinuses/Orbits: Clear sinuses.  No acute findings. Other: No mastoid effusions. CT CERVICAL SPINE FINDINGS Alignment: Mild stepwise anterolisthesis of C3 on C4, C4 on C5 and C5 on C6, likely degenerative in etiology given degenerative changes at these levels. Skull base and vertebrae: Evidence of acute fracture. Soft tissues and spinal canal: No prevertebral fluid or swelling. No visible canal hematoma. Disc levels: Multilevel degenerative change facet and uncovertebral hypertrophy which contributes to read degrees neural foraminal stenosis. Also, multilevel degenerative disc disease which is greatest in the lower cervical spine. Upper chest: Visualized lung apices are clear. IMPRESSION: No evidence of acute abnormality intracranially or in the cervical spine. Electronically Signed   By: Feliberto Harts M.D.   On: 05/11/2023 16:57   CT Cervical Spine Wo Contrast  Result Date: 05/11/2023 CLINICAL DATA:  Facial trauma, blunt; Neck  trauma (Age >= 65y) EXAM: CT HEAD WITHOUT CONTRAST CT CERVICAL SPINE WITHOUT CONTRAST TECHNIQUE: Multidetector CT imaging of the head and cervical spine was performed following the standard protocol without intravenous contrast.  Multiplanar CT image reconstructions of the cervical spine were also generated. RADIATION DOSE REDUCTION: This exam was performed according to the departmental dose-optimization program which includes automated exposure control, adjustment of the mA and/or kV according to patient size and/or use of iterative reconstruction technique. COMPARISON:  None Available. FINDINGS: CT HEAD FINDINGS Brain: No evidence of acute infarction, hemorrhage, hydrocephalus, extra-axial collection or mass lesion/mass effect. Vascular: No hyperdense vessel identified. Skull: No acute fracture. Sinuses/Orbits: Clear sinuses.  No acute findings. Other: No mastoid effusions. CT CERVICAL SPINE FINDINGS Alignment: Mild stepwise anterolisthesis of C3 on C4, C4 on C5 and C5 on C6, likely degenerative in etiology given degenerative changes at these levels. Skull base and vertebrae: Evidence of acute fracture. Soft tissues and spinal canal: No prevertebral fluid or swelling. No visible canal hematoma. Disc levels: Multilevel degenerative change facet and uncovertebral hypertrophy which contributes to read degrees neural foraminal stenosis. Also, multilevel degenerative disc disease which is greatest in the lower cervical spine. Upper chest: Visualized lung apices are clear. IMPRESSION: No evidence of acute abnormality intracranially or in the cervical spine. Electronically Signed   By: Feliberto Harts M.D.   On: 05/11/2023 16:57   DG Chest Portable 1 View  Result Date: 05/11/2023 CLINICAL DATA:  Chest pain, shortness of breath, fall EXAM: PORTABLE CHEST 1 VIEW COMPARISON:  11/03/2021 FINDINGS: Low lung volumes. Mild cardiomegaly. No new focal pulmonary opacity. No pleural effusion or pneumothorax. No acute osseous  abnormality. IMPRESSION: Low lung volumes. No acute cardiopulmonary process. Electronically Signed   By: Wiliam Ke M.D.   On: 05/11/2023 16:10    Pending Labs Unresulted Labs (From admission, onward)     Start     Ordered   05/12/23 0500  Basic metabolic panel  Tomorrow morning,   R        05/11/23 1856   05/12/23 0500  CBC  Tomorrow morning,   R        05/11/23 1856   05/12/23 0500  Magnesium  Tomorrow morning,   R        05/11/23 1856   05/11/23 1854  HIV Antibody (routine testing w rflx)  (HIV Antibody (Routine testing w reflex) panel)  Once,   R        05/11/23 1856   05/11/23 1822  CK  Once,   URGENT        05/11/23 1821   05/11/23 1553  Urinalysis, w/ Reflex to Culture (Infection Suspected) -Urine, Clean Catch  Once,   URGENT       Question:  Specimen Source  Answer:  Urine, Clean Catch   05/11/23 1552   05/11/23 1553  Rapid urine drug screen (hospital performed)  ONCE - STAT,   STAT        05/11/23 1552   05/11/23 1547  T4, free  Once,   URGENT        05/11/23 1547   05/11/23 1547  Blood culture (routine x 2)  BLOOD CULTURE X 2,   R (with STAT occurrences)      05/11/23 1547   05/11/23 1547  Brain natriuretic peptide  Once,   URGENT        05/11/23 1547            Vitals/Pain Today's Vitals   05/11/23 1730 05/11/23 1745 05/11/23 1800 05/11/23 1815  BP: 91/64 96/68 102/69 115/72  Pulse: 66 68 60 (!) 57  Resp:    17  Temp:      TempSrc:  SpO2: 98% 98% 96% 98%    Isolation Precautions No active isolations  Medications Medications  metroNIDAZOLE (FLAGYL) IVPB 500 mg (500 mg Intravenous New Bag/Given 05/11/23 1813)  ceFEPIme (MAXIPIME) 2 g in sodium chloride 0.9 % 100 mL IVPB (has no administration in time range)  vancomycin (VANCOREADY) IVPB 2000 mg/400 mL (has no administration in time range)  sorbitol 70 % solution 30 mL (has no administration in time range)  lactated ringers infusion (has no administration in time range)  lactated ringers bolus  1,000 mL (0 mLs Intravenous Stopped 05/11/23 1630)  sodium chloride 0.9 % bolus 1,000 mL (0 mLs Intravenous Stopped 05/11/23 1632)  iohexol (OMNIPAQUE) 350 MG/ML injection 80 mL (80 mLs Intravenous Contrast Given 05/11/23 1632)    Mobility walks

## 2023-05-12 DIAGNOSIS — I480 Paroxysmal atrial fibrillation: Secondary | ICD-10-CM

## 2023-05-12 DIAGNOSIS — F1190 Opioid use, unspecified, uncomplicated: Secondary | ICD-10-CM | POA: Diagnosis not present

## 2023-05-12 DIAGNOSIS — I4891 Unspecified atrial fibrillation: Secondary | ICD-10-CM

## 2023-05-12 DIAGNOSIS — E861 Hypovolemia: Secondary | ICD-10-CM

## 2023-05-12 DIAGNOSIS — N179 Acute kidney failure, unspecified: Principal | ICD-10-CM

## 2023-05-12 LAB — CBC
HCT: 39.6 % (ref 39.0–52.0)
Hemoglobin: 12.3 g/dL — ABNORMAL LOW (ref 13.0–17.0)
MCH: 30.1 pg (ref 26.0–34.0)
MCHC: 31.1 g/dL (ref 30.0–36.0)
MCV: 97.1 fL (ref 80.0–100.0)
Platelets: 180 10*3/uL (ref 150–400)
RBC: 4.08 MIL/uL — ABNORMAL LOW (ref 4.22–5.81)
RDW: 14.2 % (ref 11.5–15.5)
WBC: 7 10*3/uL (ref 4.0–10.5)
nRBC: 0 % (ref 0.0–0.2)

## 2023-05-12 LAB — HIV ANTIBODY (ROUTINE TESTING W REFLEX): HIV Screen 4th Generation wRfx: NONREACTIVE

## 2023-05-12 LAB — BASIC METABOLIC PANEL
Anion gap: 7 (ref 5–15)
BUN: 15 mg/dL (ref 8–23)
CO2: 21 mmol/L — ABNORMAL LOW (ref 22–32)
Calcium: 8.5 mg/dL — ABNORMAL LOW (ref 8.9–10.3)
Chloride: 110 mmol/L (ref 98–111)
Creatinine, Ser: 1.2 mg/dL (ref 0.61–1.24)
GFR, Estimated: >60 mL/min (ref >60)
Glucose, Bld: 83 mg/dL (ref 70–99)
Potassium: 3.3 mmol/L — ABNORMAL LOW (ref 3.5–5.1)
Sodium: 138 mmol/L (ref 135–145)

## 2023-05-12 LAB — CULTURE, BLOOD (ROUTINE X 2)
Special Requests: ADEQUATE
Special Requests: ADEQUATE

## 2023-05-12 LAB — MAGNESIUM: Magnesium: 1.9 mg/dL (ref 1.7–2.4)

## 2023-05-12 MED ORDER — TAMSULOSIN HCL 0.4 MG PO CAPS: 0.4000 mg | ORAL_CAPSULE | Freq: Every day | ORAL | Status: DC

## 2023-05-12 NOTE — Care Management Obs Status (Signed)
MEDICARE OBSERVATION STATUS NOTIFICATION   Patient Details  Name: COULTER GALLER MRN: [id number] Date of Birth: 1956-04-16   Medicare Observation Status Notification Given:  Yes    Adrian Prows, RN 05/12/2023, 12:38 PM

## 2023-05-12 NOTE — TOC Initial Note (Signed)
Transition of Care [facility]) - Initial/Assessment Note    Patient Details  Name: Parker Daniel MRN: [id number] Date of Birth: 07/27/1956  Transition of Care [facility]) CM/SW Contact:    Adrian Prows, RN Phone Number: 05/12/2023, 12:44 PM  Clinical Narrative:                 No PCP listed; spoke w/ pt in room; pt says he is from home and plans to return at d/c; he identified POC wife Lonzell Snawder ([phone]); pt says his PCP is Dr Laverda Page w/ [facility] iin [place], [place]; he has transportation; he denies IPV, food/housing insecurity, and difficulty paying utilities; pt says he hs reading glasses, cane, and grab bars in shower; he does not have HH services or home oxygen; no TOC needs.  Expected Discharge Plan: Home/Self Care Barriers to Discharge: No Barriers Identified   Patient Goals and CMS Choice Patient states their goals for this hospitalization and ongoing recovery are:: home   Choice offered to / list presented to : NA      Expected Discharge Plan and Services   Discharge Planning Services: CM Consult   Living arrangements for the past 2 months: Single Family Home Expected Discharge Date: 05/12/23               DME Arranged: N/A DME Agency: NA       HH Arranged: NA HH Agency: NA        Prior Living Arrangements/Services Living arrangements for the past 2 months: Single Family Home Lives with:: Spouse Patient language and need for interpreter reviewed:: Yes Do you feel safe going back to the place where you live?: Yes      Need for Family Participation in Patient Care: Yes (Comment)   Current home services: DME (cane) Criminal Activity/Legal Involvement Pertinent to Current Situation/Hospitalization: No - Comment as needed  Activities of Daily Living Home Assistive Devices/Equipment: None ADL Screening (condition at time of admission) Patient's cognitive ability adequate to safely complete daily activities?: Yes Is the patient deaf or have  difficulty hearing?: No Does the patient have difficulty seeing, even when wearing glasses/contacts?: No Does the patient have difficulty concentrating, remembering, or making decisions?: No Patient able to express need for assistance with ADLs?: Yes Does the patient have difficulty dressing or bathing?: No Independently performs ADLs?: Yes (appropriate for developmental age) Does the patient have difficulty walking or climbing stairs?: No Weakness of Legs: None Weakness of Arms/Hands: None  Permission Sought/Granted Permission sought to share information with : Case Manager Permission granted to share information with : Yes, Verbal Permission Granted  Share Information with NAME: Case Manager     Permission granted to share info w Relationship: Ehab Leandro (spouse) [phone]     Emotional Assessment Appearance:: Appears stated age Attitude/Demeanor/Rapport: Gracious Affect (typically observed): Accepting Orientation: : Oriented to Self, Oriented to Place, Oriented to  Time, Oriented to Situation Alcohol / Substance Use: Not Applicable Psych Involvement: No (comment)  Admission diagnosis:  Acute kidney injury (HCC) [N17.9] AKI (acute kidney injury) (HCC) [N17.9] Patient Active Problem List   Diagnosis Date Noted   Paroxysmal atrial fibrillation (HCC) 05/12/2023   Chronic narcotic use 05/12/2023   Hypotension 05/11/2023   AKI (acute kidney injury) (HCC) 05/11/2023   Acute kidney injury (HCC) 05/11/2023   Status post total right knee replacement 03/17/2022   Chronic anticoagulation 02/15/2021   Unilateral primary osteoarthritis, right knee 08/07/2019   History of arthroplasty of left hip 06/28/2019  Status post total replacement of left hip 06/27/2019   Unilateral primary osteoarthritis, left hip 04/22/2019   B12 deficiency 06/08/2017   Idiopathic peripheral neuropathy 06/08/2017   Controlled type 2 diabetes mellitus without complication, without long-term current use of  insulin (HCC) 03/09/2016   Controlled substance agreement signed 08/20/2014   Hyperlipidemia associated with type 2 diabetes mellitus (HCC) 12/02/2010   Gouty arthritis 12/02/2010   PCP:  System, Provider Not In Pharmacy:   [facility] - [place], [place] - [address] [place] [address] Phone: [phone] Fax: [phone]  [id number] - [place], [place] - [address] [place] [address] Phone: [phone] Fax: [phone]     Social Determinants of Health (SDOH) Social History: SDOH Screenings   Food Insecurity: No Food Insecurity (05/12/2023)  Housing: Patient Declined (05/12/2023)  Transportation Needs: No Transportation Needs (05/12/2023)  Utilities: Not At Risk (05/12/2023)  Tobacco Use: Low Risk  (05/11/2023)   SDOH Interventions: Food Insecurity Interventions: Inpatient TOC, Intervention Not Indicated Housing Interventions: Intervention Not Indicated, Inpatient TOC Transportation Interventions: Intervention Not Indicated, Inpatient TOC Utilities Interventions: Inpatient TOC   Readmission Risk Interventions     No data to display

## 2023-05-12 NOTE — Care Management CC44 (Signed)
Condition Code 44 Documentation Completed  Patient Details  Name: DELSHAWN COKLEY MRN: [id number] Date of Birth: Sep 27, 1956   Condition Code 44 given:  Yes Patient signature on Condition Code 44 notice:  Yes Documentation of 2 MD's agreement:  Yes Code 44 added to claim:  Yes    Adrian Prows, RN 05/12/2023, 12:39 PM

## 2023-05-12 NOTE — Discharge Summary (Signed)
Physician Discharge Summary  DEL BLESSINGTON VWU:[id number] DOB: [birth year] DOA: 05/11/2023  PCP: System, Provider Not In  Admit date: 05/11/2023 Discharge date: 05/12/2023  Admitted From: home Disposition:  home  Recommendations for Outpatient Follow-up:  Follow up with PCP as scheduled next week Please obtain BMP/CBC in one week  Home Health: none Equipment/Devices: none  Discharge Condition: stable CODE STATUS: Full code Diet Orders (From admission, onward)     Start     Ordered   05/11/23 1856  Diet regular Room service appropriate? Yes; Fluid consistency: Thin  Diet effective now       Question Answer Comment  Room service appropriate? Yes   Fluid consistency: Thin      05/11/23 1856            HPI: Per admitting MD,  Parker Daniel is a 67 y.o. male with medical history significant for paroxysmal atrial fibrillation on Eliquis, diabetes mellitus type 2, hypertension, chronic pain on narcotics, and mitral valve surgery.  The patient says he woke up sore this morning after spending yesterday in the sun doing yard work at some rental properties.  He got up out of bed took a few steps and passed out.  He remembers waking up on the floor with his dog licking his face.  He hit his head and his shoulder and they were bruised and sore. He ate something and felt a little better.  Patient reports that he drinks lots of water during the day when he is out. He went to work still had trouble. He developed some chest pain and had some trouble breathing so he decided to drive himself back home.  By this time he was very lightheaded and blurred vision, but he did make it back home.  His wife had him brought in for evaluation.  His blood pressures at home had systolics in the 60s. In the emergency depart but he will be admitted because of his AKI and for monitoring.  Ment the patient had a full trauma workup because of his syncope and bruising and he is on Eliquis.  He his CT scans did not reveal  any significant trauma.  His blood pressure improved after 2 L of NS  Hospital Course / Discharge diagnoses: Principal Problem:   AKI (acute kidney injury) (HCC) Active Problems:   Hypotension   Acute kidney injury (HCC)   Paroxysmal atrial fibrillation (HCC)   Chronic narcotic use  Principal problem  AKI -patient was admitted to the hospital with acute kidney injury in the setting of profound hypotension.  This is likely in the setting of working outdoors and probably not hydrating enough, and also concomitant use of antihypertensives at home.  Systolic blood pressure was in the 60s at 1 point.  His antihypertensives were held, he received IV fluids with significant improvement in his creatinine.  He feels better, back to baseline, tolerating p.o. intake and will be discharged home in stable condition.  His antihypertensives, losartan, metoprolol will be held upon discharge.  He has a follow-up with his PCP in 2 days, he was advised to keep that appointment, have blood work checked, blood pressure checked and consideration to resume this medications as per PCP.  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  He would benefit from weight loss BPH-continue home  medications Right renal lesion -indeterminant, 2.1 cm.  Needs an MRI renal protocol as an outpatient.  He has follow-up with Dr. Mena Goes and his PCP also  Sepsis ruled out   Discharge Instructions   Allergies as of 05/12/2023       Reactions   Atorvastatin Other (See Comments)   Other reaction(s): Myalgia Takes pravastatin at home   Rosuvastatin Other (See Comments)   Other reaction(s): Myalgia Takes pravastatin at home         Medication List     STOP taking these medications    losartan 50 MG tablet Commonly known as: COZAAR   metoprolol succinate 25 MG 24 hr tablet Commonly known as: TOPROL-XL       TAKE these medications    acetaminophen 500 MG tablet Commonly known as: TYLENOL Take 1,000 mg by mouth every 8 (eight) hours as needed for moderate pain.   allopurinol 300 MG tablet Commonly known as: ZYLOPRIM Take 300 mg by mouth daily at 6 PM. Pt takes at 6 pm at nite   atorvastatin 40 MG tablet Commonly known as: LIPITOR Take 1 tablet (40 mg total) by mouth daily.   Eliquis 5 MG Tabs tablet Generic drug: apixaban Take 1 tablet (5 mg total) by mouth 2 (two) times daily.   ezetimibe 10 MG tablet Commonly known as: ZETIA Take 1 tablet by mouth daily.   FeroSul 325 (65 FE) MG tablet Generic drug: ferrous sulfate Take 325 mg by mouth every other day.   gabapentin 300 MG capsule Commonly known as: NEURONTIN Take 600 mg by mouth 3 (three) times daily.   Multaq 400 MG tablet Generic drug: dronedarone Take 400 mg by mouth 2 (two) times daily. Pt takes at 6pm and 6 am   oxyCODONE 15 MG immediate release tablet Commonly known as: ROXICODONE Take 15 mg by mouth 2 (two) times daily as needed for pain.   Ozempic (1 MG/DOSE) 4 MG/3ML Sopn Generic drug: Semaglutide (1 MG/DOSE) Inject 1 mg into the skin once a week.   pregabalin 75 MG capsule Commonly known as: LYRICA Take 75 mg by mouth 3 (three) times daily.   senna 8.6 MG tablet Commonly known as: SENOKOT Take 2-3 tablets by mouth daily as needed for constipation.   tamsulosin 0.4 MG Caps capsule Commonly known as: FLOMAX Take 0.4 mg by mouth at bedtime. 9 pm   testosterone cypionate 200 MG/ML injection Commonly known as: DEPOTESTOSTERONE CYPIONATE Inject 200 mg into the muscle every 21 ( twenty-one) days.   traZODone 50 MG tablet Commonly known as: DESYREL Take 50 mg by mouth at bedtime.   Xtampza ER 18 MG  C12a Generic drug: oxyCODONE ER Take 18 mg by mouth every 12 (twelve) hours. Pt takes at 6pm and 6 am       Consultations: none  Procedures/Studies:  CT Angio Chest/Abd/Pel for Dissection W and/or Wo Contrast  Result Date: 05/11/2023 CLINICAL DATA:  Acute aortic syndrome (AAS) suspected hypotension, chest pain, fall 9am reports he hit himself in the head with the door and passed out. Pt was home alone, not sure how long he was out. EXAM: CT ANGIOGRAPHY CHEST, ABDOMEN AND PELVIS TECHNIQUE: Non-contrast CT of the chest was initially obtained. Multidetector CT imaging through the chest, abdomen and pelvis was performed using the standard protocol during bolus administration of intravenous contrast. Multiplanar reconstructed images and MIPs were obtained and reviewed to evaluate the vascular anatomy. RADIATION DOSE REDUCTION: This exam was performed according to the departmental dose-optimization program  which includes automated exposure control, adjustment of the mA and/or kV according to patient size and/or use of iterative reconstruction technique. CONTRAST:  80mL OMNIPAQUE IOHEXOL 350 MG/ML SOLN COMPARISON:  None Available. FINDINGS: CTA CHEST FINDINGS Cardiovascular: Preferential opacification of the thoracic aorta. No aortic injury. No evidence of thoracic aortic aneurysm or dissection. Normal heart size. No pericardial effusion. Left anterior descending coronary calcification. Enlarged main pulmonary caliber measuring up to 3.5 cm. No pulmonary embolus Mediastinum/Nodes: No pneumomediastinum. No mediastinal hematoma. The esophagus is unremarkable. The thyroid is unremarkable. The central airways are patent. No mediastinal, hilar, or axillary lymphadenopathy. Lungs/Pleura: Azygous fissure noted. Bilateral lower lobe atelectasis. No focal consolidation. No pulmonary nodule. No pulmonary mass. No pulmonary contusion or laceration. No pneumatocele formation. No pleural effusion. No pneumothorax. No  hemothorax. Musculoskeletal/Chest wall: No chest wall mass. No acute rib or sternal fracture. No spinal fracture. Review of the MIP images confirms the above findings. CTA ABDOMEN AND PELVIS FINDINGS VASCULAR Aorta: Mild atherosclerotic plaque. No active contrast extravasation or pseudoaneurysm. Normal caliber aorta without aneurysm, dissection, vasculitis or significant stenosis. Celiac: Patent without evidence of aneurysm, dissection, vasculitis or significant stenosis. SMA: Mild atherosclerotic plaque. Patent without evidence of aneurysm, dissection, vasculitis or significant stenosis. Renals: Mild atherosclerotic plaque. Both renal arteries are patent without evidence of aneurysm, dissection, vasculitis, fibromuscular dysplasia or significant stenosis. IMA: Patent without evidence of aneurysm, dissection, vasculitis or significant stenosis. Inflow: Mild atherosclerotic plaque. Patent without evidence of aneurysm, dissection, vasculitis or significant stenosis. Veins: No obvious venous abnormality within the limitations of this arterial phase study. Review of the MIP images confirms the above findings. NON-VASCULAR ABDOMEN / PELVIS: Hepatobiliary: Not enlarged. No focal lesion. No laceration or subcapsular hematoma. Status post cholecystectomy.  No biliary ductal dilatation. Pancreas: Normal pancreatic contour. No main pancreatic duct dilatation. Spleen: Not enlarged. No focal lesion. No laceration, subcapsular hematoma, or vascular injury. Adrenals/Urinary Tract: No nodularity bilaterally. Bilateral kidneys enhance symmetrically. A hypodense 2.1 x 1.7 cm right renal lesion with a density of 38 Hounsfield units. No hydronephrosis. No contusion, laceration, or subcapsular hematoma. No injury to the vascular structures or collecting systems. No hydroureter. The urinary bladder is unremarkable. Stomach/Bowel: No small or large bowel wall thickening or dilatation. The appendix is unremarkable. Lymphatics: No  abdominal, pelvic, inguinal lymphadenopathy. Reproductive: Normal. Other: No simple free fluid ascites. No pneumoperitoneum. No hemoperitoneum. No mesenteric hematoma identified. No organized fluid collection. Musculoskeletal: No significant soft tissue hematoma. No acute pelvic fracture. No spinal fracture. Total left hip arthroplasty. Ports and Devices: None. Review of the MIP images confirms the above findings. IMPRESSION: 1. No acute thoracic or abdominal aorta abnormality. 2. No pulmonary embolus. 3. Enlarged main pulmonary artery suggestive of pulmonary hypertension. 4. No acute intrathoracic, intra-abdominal, intrapelvic traumatic injury. 5. No acute fracture or traumatic malalignment of the thoracic or lumbar spine. 6. Indeterminate 2.1 cm right renal lesion. When the patient is clinically stable and able to follow directions and hold their breath (preferably as an outpatient) further evaluation with dedicated MRI renal protocol should be considered. Electronically Signed   By: Tish Frederickson M.D.   On: 05/11/2023 17:04   CT Head Wo Contrast  Result Date: 05/11/2023 CLINICAL DATA:  Facial trauma, blunt; Neck trauma (Age >= 65y) EXAM: CT HEAD WITHOUT CONTRAST CT CERVICAL SPINE WITHOUT CONTRAST TECHNIQUE: Multidetector CT imaging of the head and cervical spine was performed following the standard protocol without intravenous contrast. Multiplanar CT image reconstructions of the cervical spine were also generated. RADIATION DOSE REDUCTION:  This exam was performed according to the departmental dose-optimization program which includes automated exposure control, adjustment of the mA and/or kV according to patient size and/or use of iterative reconstruction technique. COMPARISON:  None Available. FINDINGS: CT HEAD FINDINGS Brain: No evidence of acute infarction, hemorrhage, hydrocephalus, extra-axial collection or mass lesion/mass effect. Vascular: No hyperdense vessel identified. Skull: No acute fracture.  Sinuses/Orbits: Clear sinuses.  No acute findings. Other: No mastoid effusions. CT CERVICAL SPINE FINDINGS Alignment: Mild stepwise anterolisthesis of C3 on C4, C4 on C5 and C5 on C6, likely degenerative in etiology given degenerative changes at these levels. Skull base and vertebrae: Evidence of acute fracture. Soft tissues and spinal canal: No prevertebral fluid or swelling. No visible canal hematoma. Disc levels: Multilevel degenerative change facet and uncovertebral hypertrophy which contributes to read degrees neural foraminal stenosis. Also, multilevel degenerative disc disease which is greatest in the lower cervical spine. Upper chest: Visualized lung apices are clear. IMPRESSION: No evidence of acute abnormality intracranially or in the cervical spine. Electronically Signed   By: Feliberto Harts M.D.   On: 05/11/2023 16:57   CT Cervical Spine Wo Contrast  Result Date: 05/11/2023 CLINICAL DATA:  Facial trauma, blunt; Neck trauma (Age >= 65y) EXAM: CT HEAD WITHOUT CONTRAST CT CERVICAL SPINE WITHOUT CONTRAST TECHNIQUE: Multidetector CT imaging of the head and cervical spine was performed following the standard protocol without intravenous contrast. Multiplanar CT image reconstructions of the cervical spine were also generated. RADIATION DOSE REDUCTION: This exam was performed according to the departmental dose-optimization program which includes automated exposure control, adjustment of the mA and/or kV according to patient size and/or use of iterative reconstruction technique. COMPARISON:  None Available. FINDINGS: CT HEAD FINDINGS Brain: No evidence of acute infarction, hemorrhage, hydrocephalus, extra-axial collection or mass lesion/mass effect. Vascular: No hyperdense vessel identified. Skull: No acute fracture. Sinuses/Orbits: Clear sinuses.  No acute findings. Other: No mastoid effusions. CT CERVICAL SPINE FINDINGS Alignment: Mild stepwise anterolisthesis of C3 on C4, C4 on C5 and C5 on C6, likely  degenerative in etiology given degenerative changes at these levels. Skull base and vertebrae: Evidence of acute fracture. Soft tissues and spinal canal: No prevertebral fluid or swelling. No visible canal hematoma. Disc levels: Multilevel degenerative change facet and uncovertebral hypertrophy which contributes to read degrees neural foraminal stenosis. Also, multilevel degenerative disc disease which is greatest in the lower cervical spine. Upper chest: Visualized lung apices are clear. IMPRESSION: No evidence of acute abnormality intracranially or in the cervical spine. Electronically Signed   By: Feliberto Harts M.D.   On: 05/11/2023 16:57   DG Chest Portable 1 View  Result Date: 05/11/2023 CLINICAL DATA:  Chest pain, shortness of breath, fall EXAM: PORTABLE CHEST 1 VIEW COMPARISON:  11/03/2021 FINDINGS: Low lung volumes. Mild cardiomegaly. No new focal pulmonary opacity. No pleural effusion or pneumothorax. No acute osseous abnormality. IMPRESSION: Low lung volumes. No acute cardiopulmonary process. Electronically Signed   By: Wiliam Ke M.D.   On: 05/11/2023 16:10     Subjective: - no chest pain, shortness of breath, no abdominal pain, nausea or vomiting.   Discharge Exam: BP (!) 144/77 (BP Location: Left Arm)   Pulse 65   Temp 97.6 F (36.4 C) (Oral)   Resp 16   Ht 5\' 10"  (1.778 m)   Wt 105.2 kg   SpO2 100%   BMI 33.28 kg/m   General: Pt is alert, awake, not in acute distress Cardiovascular: RRR, S1/S2 +, no rubs, no gallops Respiratory: CTA bilaterally, no  wheezing, no rhonchi Abdominal: Soft, NT, ND, bowel sounds + Extremities: no edema, no cyanosis    The results of significant diagnostics from this hospitalization (including imaging, microbiology, ancillary and laboratory) are listed below for reference.     Microbiology: Recent Results (from the past 240 hour(s))  Blood culture (routine x 2)     Status: None (Preliminary result)   Collection Time: 05/11/23  3:51  PM   Specimen: BLOOD RIGHT HAND  Result Value Ref Range Status   Specimen Description   Final    BLOOD RIGHT HAND Performed at [facility], [address] [place], [place] [address]    Special Requests   Final    BOTTLES DRAWN AEROBIC AND ANAEROBIC Blood Culture adequate volume Performed at [facility], [address], [place], [place] [address]    Culture   Final    NO GROWTH < 24 HOURS Performed at [facility], [address] [place], [place] [address]    Report Status PENDING  Incomplete  Blood culture (routine x 2)     Status: None (Preliminary result)   Collection Time: 05/11/23  3:51 PM   Specimen: Right Antecubital; Blood  Result Value Ref Range Status   Specimen Description   Final    RIGHT ANTECUBITAL BLOOD Performed at [facility], [address] [place], [place] [address]    Special Requests   Final    BOTTLES DRAWN AEROBIC AND ANAEROBIC Blood Culture adequate volume Performed at [facility], [address] [place], [place] [address]    Culture   Final    NO GROWTH < 24 HOURS Performed at [facility], [address], [place], [place] [address]    Report Status PENDING  Incomplete     Labs: Basic Metabolic Panel: Recent Labs  Lab 05/11/23 1546 05/11/23 1609 05/12/23 0753  NA 138 141 138  K 3.7 4.7 3.3*  CL 107 108 110  CO2 18*  --  21*  GLUCOSE 106* 109* 83  BUN 20 26* 15  CREATININE 2.35* 2.50* 1.20  CALCIUM 9.8  --  8.5*  MG 2.3  --  1.9   Liver Function Tests: Recent Labs  Lab 05/11/23 1546  AST 26  ALT 27  ALKPHOS 65  BILITOT 1.7*  PROT 8.0  ALBUMIN 4.6   CBC: Recent Labs  Lab 05/11/23 1546 05/11/23 1609 05/12/23 0753  WBC 10.6*  --  7.0  NEUTROABS 8.1*  --   --   HGB 14.1 16.0 12.3*  HCT 44.2 47.0 39.6  MCV 94.2  --  97.1  PLT 229  --  180   CBG: Recent Labs  Lab 05/11/23 1539  GLUCAP 108*   Hgb A1c No results for input(s): "HGBA1C" in  the last 72 hours. Lipid Profile No results for input(s): "CHOL", "HDL", "LDLCALC", "TRIG", "CHOLHDL", "LDLDIRECT" in the last 72 hours. Thyroid function studies Recent Labs    05/11/23 1547  TSH 0.682   Urinalysis    Component Value Date/Time   COLORURINE YELLOW 05/11/2023 1553   APPEARANCEUR CLEAR 05/11/2023 1553   LABSPEC >1.046 (H) 05/11/2023 1553   PHURINE 5.0 05/11/2023 1553   GLUCOSEU NEGATIVE 05/11/2023 1553   HGBUR NEGATIVE 05/11/2023 1553   BILIRUBINUR NEGATIVE 05/11/2023 1553   KETONESUR NEGATIVE 05/11/2023 1553   PROTEINUR 30 (A) 05/11/2023 1553   NITRITE NEGATIVE 05/11/2023 1553   LEUKOCYTESUR NEGATIVE 05/11/2023 1553    FURTHER DISCHARGE INSTRUCTIONS:  Get Medicines reviewed and adjusted: Please take all your medications with you for your next visit with your Primary MD   Laboratory/radiological data: Please request your Primary MD to go over all hospital tests and procedure/radiological results at the follow up, please ask your Primary MD to get all Hospital records sent to his/her office.   In some cases, they will be blood work, cultures and biopsy results pending at the time of your discharge. Please request that your primary care M.D. goes through all the records of your hospital data and follows up on these results.   Also Note the following: If you experience worsening of your admission symptoms, develop shortness of breath, life threatening emergency, suicidal or homicidal thoughts you must seek medical attention immediately by calling 911 or calling your MD immediately  if symptoms less severe.   You must read complete instructions/literature along with all the possible adverse reactions/side effects for all the Medicines you take and that have been prescribed to you. Take any new Medicines after you have completely understood and accpet all the possible adverse reactions/side effects.    Do not drive when taking Pain medications or sleeping medications  (Benzodaizepines)   Do not take more than prescribed Pain, Sleep and Anxiety Medications. It is not advisable to combine anxiety,sleep and pain medications without talking with your primary care practitioner   Special Instructions: If you have smoked or chewed Tobacco  in the last 2 yrs please stop smoking, stop any regular Alcohol  and or any Recreational drug use.   Wear Seat belts while driving.   Please note: You were cared for by a hospitalist during your hospital stay. Once you are discharged, your primary care physician will handle any further medical issues. Please note that NO REFILLS for any discharge medications will be authorized once you are discharged, as it is imperative that you return to your primary care physician (or establish a relationship with a primary care physician if you do not have one) for your post hospital discharge needs so that they can reassess your need for medications and monitor your lab values.  Time coordinating discharge: 35 minutes  SIGNED:  Pamella Pert, MD, PhD 05/12/2023, 12:01 PM

## 2023-05-12 NOTE — H&P (Addendum)
History and Physical    Patient: Parker Daniel DOB: 20-Nov-1955 DOA: 05/11/2023 DOS: the patient was seen and examined on 05/12/2023 PCP: System, Provider Not In  Patient coming from:   Chief Complaint:  Chief Complaint  Patient presents with   Head Injury   Hypotension   Blurred Vision   Chest Pain   Shortness of Breath   HPI: Parker Daniel is a 67 y.o. male with medical history significant for paroxysmal atrial fibrillation on Eliquis, diabetes mellitus type 2, hypertension, chronic pain on narcotics, and mitral valve surgery.  The patient says he woke up sore this morning after spending yesterday in the sun doing yard work at some rental properties.  He got up out of bed took a few steps and passed out.  He remembers waking up on the floor with his dog licking his face.  He hit his head and his shoulder and they were bruised and sore. He ate something and felt a little better.  Patient reports that he drinks lots of water during the day when he is out. He went to work still had trouble. He developed some chest pain and had some trouble breathing so he decided to drive himself back home.  By this time he was very lightheaded and blurred vision, but he did make it back home.  His wife had him brought in for evaluation.  His blood pressures at home had systolics in the 60s. In the emergency depart but he will be admitted because of his AKI and for monitoring.  Ment the patient had a full trauma workup because of his syncope and bruising and he is on Eliquis.  He his CT scans did not reveal any significant trauma.  His blood pressure improved after 2 L of NS,   Review of Systems:  Past Medical History:  Diagnosis Date   Arthritis    Coronary artery disease    Diabetes mellitus without complication (HCC)    Dyspnea    with exertion   GERD (gastroesophageal reflux disease)    Gout    History of kidney stones    Hyperlipidemia    Hypertension    Pneumonia    10/2021   Past  Surgical History:  Procedure Laterality Date   APPENDECTOMY     age 14   CHOLECYSTECTOMY     age 21   CYSTOSCOPY/URETEROSCOPY/HOLMIUM LASER/STENT PLACEMENT Right 01/24/2022   Procedure: CYSTOSCOPY/RETROGRADE/URETEROSCOPY/HOLMIUM LASER/STENT PLACEMENT;  Surgeon: Jerilee Field, MD;  Location: [facility];  Service: Urology;  Laterality: Right;   KNEE SURGERY     SURGERY SCROTAL / TESTICULAR     age 67   THULIUM LASER TURP (TRANSURETHRAL RESECTION OF PROSTATE) N/A 01/24/2022   Procedure: THULIUM LASER TURP (TRANSURETHRAL RESECTION OF PROSTATE);  Surgeon: Jerilee Field, MD;  Location: [facility];  Service: Urology;  Laterality: N/A;   TONSILLECTOMY     TOTAL HIP ARTHROPLASTY Left 06/27/2019   Procedure: LEFT TOTAL HIP ARTHROPLASTY ANTERIOR APPROACH;  Surgeon: Kathryne Hitch, MD;  Location: [facility];  Service: Orthopedics;  Laterality: Left;   TOTAL KNEE ARTHROPLASTY Right 03/17/2022   Procedure: RIGHT TOTAL KNEE ARTHROPLASTY;  Surgeon: Kathryne Hitch, MD;  Location: [facility];  Service: Orthopedics;  Laterality: Right;   Social History:  reports that he has never smoked. He has never used smokeless tobacco. He reports current alcohol use. He reports that he does not use drugs.  Allergies  Allergen Reactions   Atorvastatin Other (See Comments)    Other  reaction(s): Myalgia Takes pravastatin at home   Rosuvastatin Other (See Comments)    Other reaction(s): Myalgia Takes pravastatin at home    Family History  Problem Relation Age of Onset   Cancer Father        bladder    Prior to Admission medications   Medication Sig Start Date End Date Taking? Authorizing Provider  acetaminophen (TYLENOL) 500 MG tablet Take 1,000 mg by mouth every 8 (eight) hours as needed for moderate pain.   Yes [provider]  allopurinol (ZYLOPRIM) 300 MG tablet Take 300 mg by mouth daily at 6 PM. Pt takes at 6 pm at nite   Yes [provider]  atorvastatin (LIPITOR) 40 MG tablet  Take 1 tablet (40 mg total) by mouth daily. 12/08/22 12/03/23 Yes O'Neal, Ronnald Ramp, MD  ELIQUIS 5 MG TABS tablet Take 1 tablet (5 mg total) by mouth 2 (two) times daily. 01/26/22  Yes Jerilee Field, MD  ezetimibe (ZETIA) 10 MG tablet Take 1 tablet by mouth daily. 08/30/22 08/30/23 Yes [provider]  FEROSUL 325 (65 Fe) MG tablet Take 325 mg by mouth every other day. 01/31/23  Yes [provider]  gabapentin (NEURONTIN) 300 MG capsule Take 600 mg by mouth 3 (three) times daily.   Yes [provider]  losartan (COZAAR) 50 MG tablet 50 mg daily. 08/30/22 08/30/23 Yes [provider]  metoprolol succinate (TOPROL-XL) 25 MG 24 hr tablet Take 25 mg by mouth in the morning. 01/12/22  Yes [provider]  MULTAQ 400 MG tablet Take 400 mg by mouth 2 (two) times daily. Pt takes at 6pm and 6 am 11/22/21  Yes [provider]  oxyCODONE (ROXICODONE) 15 MG immediate release tablet Take 15 mg by mouth 2 (two) times daily as needed for pain. 12/02/21  Yes [provider]  OZEMPIC, 1 MG/DOSE, 4 MG/3ML SOPN Inject 1 mg into the skin once a week.   Yes [provider]  pregabalin (LYRICA) 75 MG capsule Take 75 mg by mouth 3 (three) times daily.   Yes [provider]  senna (SENOKOT) 8.6 MG tablet Take 2-3 tablets by mouth daily as needed for constipation.   Yes [provider]  tamsulosin (FLOMAX) 0.4 MG CAPS capsule Take 0.4 mg by mouth at bedtime. 9 pm 01/03/22  Yes [provider]  testosterone cypionate (DEPOTESTOSTERONE CYPIONATE) 200 MG/ML injection Inject 200 mg into the muscle every 21 ( twenty-one) days.   Yes [provider]  traZODone (DESYREL) 50 MG tablet Take 50 mg by mouth at bedtime.   Yes [provider]  XTAMPZA ER 18 MG C12A Take 18 mg by mouth every 12 (twelve) hours. Pt takes at 6pm and 6 am 12/31/21  Yes [provider]    Physical Exam: Vitals:   05/11/23 1830  05/11/23 1915 05/11/23 1930 05/11/23 2039  BP: 109/62  110/69 109/60  Pulse: 68 75 74 (!) 56  Resp:   18 20  Temp:   97.9 F (36.6 C) 97.9 F (36.6 C)  TempSrc:   Oral Oral  SpO2: 97% 98% 97% 100%  Weight:    105.2 kg  Height:    5\' 10"  (1.778 m)    Physical Exam:  General: No acute distress, well developed, well nourished HEENT: Normocephalic, atraumatic, PERRL Cardiovascular: Normal rate and rhythm. Distal pulses intact. Pulmonary: Normal pulmonary effort, normal breath sounds Gastrointestinal: Nondistended abdomen, soft, non-tender, normoactive bowel sounds, no organomegaly Musculoskeletal:Normal ROM, no lower ext edema  Lymphadenopathy: No cervical LAD. Skin: Skin is warm and dry. Neuro: No focal deficits noted, AAOx3. PSYCH: Attentive and cooperative   Data Reviewed:  Results for orders placed or performed during the hospital encounter of 05/11/23 (from the past 24 hour(s))  CBG monitoring, ED     Status: Abnormal   Collection Time: 05/11/23  3:39 PM  Result Value Ref Range   Glucose-Capillary 108 (H) 70 - 99 mg/dL  CBC with Differential     Status: Abnormal   Collection Time: 05/11/23  3:46 PM  Result Value Ref Range   WBC 10.6 (H) 4.0 - 10.5 K/uL   RBC 4.69 4.22 - 5.81 MIL/uL   Hemoglobin 14.1 13.0 - 17.0 g/dL   HCT 16.1 09.6 - 04.5 %   MCV 94.2 80.0 - 100.0 fL   MCH 30.1 26.0 - 34.0 pg   MCHC 31.9 30.0 - 36.0 g/dL   RDW 40.9 81.1 - 91.4 %   Platelets 229 150 - 400 K/uL   nRBC 0.0 0.0 - 0.2 %   Neutrophils Relative % 77 %   Neutro Abs 8.1 (H) 1.7 - 7.7 K/uL   Lymphocytes Relative 14 %   Lymphs Abs 1.5 0.7 - 4.0 K/uL   Monocytes Relative 9 %   Monocytes Absolute 0.9 0.1 - 1.0 K/uL   Eosinophils Relative 0 %   Eosinophils Absolute 0.0 0.0 - 0.5 K/uL   Basophils Relative 0 %   Basophils Absolute 0.0 0.0 - 0.1 K/uL   Immature Granulocytes 0 %   Abs Immature Granulocytes 0.03 0.00 - 0.07 K/uL  Comprehensive metabolic panel     Status: Abnormal   Collection  Time: 05/11/23  3:46 PM  Result Value Ref Range   Sodium 138 135 - 145 mmol/L   Potassium 3.7 3.5 - 5.1 mmol/L   Chloride 107 98 - 111 mmol/L   CO2 18 (L) 22 - 32 mmol/L   Glucose, Bld 106 (H) 70 - 99 mg/dL   BUN 20 8 - 23 mg/dL   Creatinine, Ser 7.82 (H) 0.61 - 1.24 mg/dL   Calcium 9.8 8.9 - 95.6 mg/dL   Total Protein 8.0 6.5 - 8.1 g/dL   Albumin 4.6 3.5 - 5.0 g/dL   AST 26 15 - 41 U/L   ALT 27 0 - 44 U/L   Alkaline Phosphatase 65 38 - 126 U/L   Total Bilirubin 1.7 (H) 0.3 - 1.2 mg/dL   GFR, Estimated 30 (L) >60 mL/min   Anion gap 13 5 - 15  Troponin I (High Sensitivity)     Status: None   Collection Time: 05/11/23  3:46 PM  Result Value Ref Range   Troponin I (High Sensitivity) 8 <18 ng/L  Magnesium     Status: None   Collection Time: 05/11/23  3:46 PM  Result Value Ref Range   Magnesium 2.3 1.7 - 2.4 mg/dL  TSH     Status: None   Collection Time: 05/11/23  3:47 PM  Result Value Ref Range   TSH 0.682 0.350 - 4.500 uIU/mL  T4, free     Status: None   Collection Time: 05/11/23  3:51 PM  Result Value Ref Range   Free T4 0.85 0.61 - 1.12 ng/dL  Lactic acid, plasma     Status: Abnormal   Collection Time: 05/11/23  3:51 PM  Result Value Ref Range   Lactic Acid, Venous 3.0 (HH) 0.5 - 1.9 mmol/L  Blood culture (routine x 2)     Status: None (  Preliminary result)   Collection Time: 05/11/23  3:51 PM   Specimen: Right Antecubital; Blood  Result Value Ref Range   Specimen Description      RIGHT ANTECUBITAL BLOOD Performed at [facility], [address] [place], [place] [address]    Special Requests      BOTTLES DRAWN AEROBIC AND ANAEROBIC Blood Culture adequate volume Performed at [facility], [address], [place], [place] [address]    Culture PENDING    Report Status PENDING   Urinalysis, w/ Reflex to Culture (Infection Suspected) -Urine, Clean Catch     Status: Abnormal   Collection Time: 05/11/23  3:53 PM  Result Value Ref Range    Specimen Source URINE, CLEAN CATCH    Color, Urine YELLOW YELLOW   APPearance CLEAR CLEAR   Specific Gravity, Urine >1.046 (H) 1.005 - 1.030   pH 5.0 5.0 - 8.0   Glucose, UA NEGATIVE NEGATIVE mg/dL   Hgb urine dipstick NEGATIVE NEGATIVE   Bilirubin Urine NEGATIVE NEGATIVE   Ketones, ur NEGATIVE NEGATIVE mg/dL   Protein, ur 30 (A) NEGATIVE mg/dL   Nitrite NEGATIVE NEGATIVE   Leukocytes,Ua NEGATIVE NEGATIVE   RBC / HPF 6-10 0 - 5 RBC/hpf   WBC, UA 0-5 0 - 5 WBC/hpf   Bacteria, UA NONE SEEN NONE SEEN   Squamous Epithelial / HPF 0-5 0 - 5 /HPF   Mucus PRESENT   I-stat chem 8, ED (not at [facility], [facility] or [facility])     Status: Abnormal   Collection Time: 05/11/23  4:09 PM  Result Value Ref Range   Sodium 141 135 - 145 mmol/L   Potassium 4.7 3.5 - 5.1 mmol/L   Chloride 108 98 - 111 mmol/L   BUN 26 (H) 8 - 23 mg/dL   Creatinine, Ser 0.98 (H) 0.61 - 1.24 mg/dL   Glucose, Bld 119 (H) 70 - 99 mg/dL   Calcium, Ion 1.47 8.29 - 1.40 mmol/L   TCO2 21 (L) 22 - 32 mmol/L   Hemoglobin 16.0 13.0 - 17.0 g/dL   HCT 56.2 13.0 - 86.5 %  Lactic acid, plasma     Status: None   Collection Time: 05/11/23  5:55 PM  Result Value Ref Range   Lactic Acid, Venous 1.5 0.5 - 1.9 mmol/L  Troponin I (High Sensitivity)     Status: None   Collection Time: 05/11/23  5:55 PM  Result Value Ref Range   Troponin I (High Sensitivity) 6 <18 ng/L  Brain natriuretic peptide     Status: Abnormal   Collection Time: 05/11/23  6:35 PM  Result Value Ref Range   B Natriuretic Peptide 121.9 (H) 0.0 - 100.0 pg/mL  CK     Status: None   Collection Time: 05/11/23  6:35 PM  Result Value Ref Range   Total CK 216 49 - 397 U/L  Rapid urine drug screen (hospital performed)     Status: None   Collection Time: 05/11/23  9:15 PM  Result Value Ref Range   Opiates NONE DETECTED NONE DETECTED   Cocaine NONE DETECTED NONE DETECTED   Benzodiazepines NONE DETECTED NONE DETECTED   Amphetamines NONE DETECTED NONE DETECTED   Tetrahydrocannabinol  NONE DETECTED NONE DETECTED   Barbiturates NONE DETECTED NONE DETECTED     Assessment and Plan: AKI  Hypotension  Syncope  - Continue IV fluids and cardiac monitor.  His hypotension resolved in the emergency department after IV fluids.  Do not see any evidence of infection  so will not continue his antibiotics been in the ED.  Monitor creatinine. - Resume routine medications, except the beat blocker     Advance Care Planning:   Code Status: Full Code  The patient names his wife as a surrogate decision maker and wants to be full code.  Consults: none  Family Communication: none Severity of Illness: The appropriate patient status for this patient is INPATIENT. Inpatient status is judged to be reasonable and necessary in order to provide the required intensity of service to ensure the patient's safety. The patient's presenting symptoms, physical exam findings, and initial radiographic and laboratory data in the context of their chronic comorbidities is felt to place them at high risk for further clinical deterioration. Furthermore, it is not anticipated that the patient will be medically stable for discharge from the hospital within 2 midnights of admission.   * I certify that at the point of admission it is my clinical judgment that the patient will require inpatient hospital care spanning beyond 2 midnights from the point of admission due to high intensity of service, high risk for further deterioration and high frequency of surveillance required.*  Author: Buena Irish, MD 05/12/2023 12:26 AM  For on call review [email or web address].

## 2023-05-13 LAB — CULTURE, BLOOD (ROUTINE X 2): Culture: NO GROWTH

## 2023-05-16 LAB — CULTURE, BLOOD (ROUTINE X 2): Culture: NO GROWTH

## 2023-06-06 NOTE — Progress Notes (Unsigned)
Cardiology Office Note:  .   Date:  06/07/2023  ID:  ROLLIN KOTOWSKI, DOB [birth year], MRN [id number] PCP: System, Provider Not In  [facility] Providers Cardiologist:  Reatha Harps, MD    History of Present Illness: .   Parker Daniel is a 67 y.o. male with past medical history of atrial fibrillation, DM, hypertension, obesity.  He presents today for follow-up regarding precordial pain.  He has known history of paroxysmal atrial fibrillation, and is followed by an electrophysiologist in [facility] system.  He had a TEE/cardioversion in 2022, he is currently on Eliquis 5 mg twice daily and Multaq 400 mg twice daily.  He was first seen by Dr. Flora Lipps on 12/08/2022 for shortness of breath and tightness in his chest.  He has a history of a elevated coronary calcium score.  He was agreeable to having a coronary CTA and echocardiogram.  He was previously on pravastatin and Zetia discussion was agreeable to transitioning to Lipitor with Zetia as his LDL goal is less than 70.  Coronary CTA on 12/21/2022 showed mild nonobstructive CAD, there was visual stenosis in the distal RCA that was felt to be more consistent with slab artifact.  The proximal LAD had mixed calcified and noncalcified plaque with 25 to 49% stenosis.  Coronary calcium score was 258, this was 74th percentile for age and sex matched control.  There was borderline dilation of the mid ascending aorta at 38 mm. His echocardiogram on 01/02/2023 indicated LVEF of 60 to 65%, LV with normal function, no RWMA and diastolic parameters were normal.  No valve abnormalities.  05/11/2023 he presented to the emergency room following a syncopal event at home that resulted in a fall, his blood pressure at home indicated systolics in the 60s.  He was admitted to the hospital with acute kidney injury in setting of profound hypotension felt this was likely as a result of working outdoors and not hydrating enough with use of antihypertensives.  His losartan and  metoprolol were held and he received IV fluids.  His creatinine returned to baseline he was also found to be hypokalemic potassium replaced.  CT scans completed as trauma workup were unremarkable. He was found to have a indeterminate right renal lesion at 2.1 cm, recommended to have renal MRI with PCP.  Today reports that he has been doing well overall.  He denies further syncopal episodes or presyncope.  He did follow-up with his primary care following event at the end of June with repeat labs that were stable.  He denies any chest pain but does endorses some intermittent shortness of breath that generally will occur at rest.  He questions if he has been going in and out of atrial fibrillation as this is similar to his presentation with prior atrial fibrillation in 2022. EKG today indicates atrial fibrillation at 60 bpm.   ROS: Today he denies chest pain, lower extremity edema, fatigue, palpitations, melena, hematuria, hemoptysis, diaphoresis, weakness, presyncope, syncope, orthopnea, and PND.  Studies Reviewed: Marland Kitchen   EKG Interpretation Date/Time:  Thursday June 07 2023 14:43:14 EDT Ventricular Rate:  60 PR Interval:    QRS Duration:  92 QT Interval:  452 QTC Calculation: 452 R Axis:   24  Text Interpretation: Atrial fibrillation Nonspecific T wave abnormality Confirmed by Reather Littler [phone]) on 06/07/2023 2:46:37 PM    Risk Assessment/Calculations:    CHA2DS2-VASc Score = 4   This indicates a 4.8% annual risk of stroke. The patient's score is based upon:  CHF History: 0 HTN History: 1 Diabetes History: 1 Stroke History: 0 Vascular Disease History: 1 Age Score: 1 Gender Score: 0            Physical Exam:   VS:  BP 110/70 (BP Location: Left Arm, Patient Position: Sitting, Cuff Size: Normal)   Pulse 60   Ht 5\' 10"  (1.778 m)   Wt 238 lb (108 kg)   SpO2 95%   BMI 34.15 kg/m    Wt Readings from Last 3 Encounters:  06/07/23 238 lb (108 kg)  05/11/23 231 lb 14.8 oz (105.2 kg)   12/08/22 261 lb (118.4 kg)    GEN: Well nourished, well developed in no acute distress NECK: No JVD; No carotid bruits CARDIAC: Irregular RR, no murmurs, rubs, gallops RESPIRATORY:  Clear to auscultation without rales, wheezing or rhonchi  ABDOMEN: Soft, non-tender, non-distended EXTREMITIES:  No edema; No deformity   ASSESSMENT AND PLAN: .    Persistent Atrial fibrillation/anticoagulation: Known history of paroxysmal atrial fibrillation, required cardioversion in 2022, had been maintained on Multaq and Eliquis. Previously followed by EP in [facility], planning to transition fully to [facility] for EP as well.  EKG during admission on 05/11/23 for syncopal event and hypotension indicates atrial fibrillation. Per patient he was told he converted back to sinus but no EKG indicates this. Today he presents for follow up, reports intermittent shortness of breath, questions if he has been going in and out of afib. EKG today shows atrial fibrillation at 60bpm. He confirms compliance with Eliquis. Discussed with Dr. Flora Lipps who recommended DCCV with referral to EP for possible ablation in the future. Patient is agreeable to this, see consent below, discussed importance of continuing Eliquis 5mg  twice daily. He will hold his Ozempic tomorrow. Reviewed ED precautions. Check CBC, BMET, TSH and Mag today. Continue Eliquis 5mg  twice daily and Multaq 400mg  twice daily. Cardioversion scheduled for Monday, 7/29 with Dr. Royann Shivers.  CHA2DS2-VASc Score = 4 [CHF History: 0, HTN History: 1, Diabetes History: 1, Stroke History: 0, Vascular Disease History: 1, Age Score: 1, Gender Score: 0].  Therefore, the patient's annual risk of stroke is 4.8 %.         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  Echo on 12/2022 indicated EF of 60 to 65%. Stable with no anginal symptoms. No indication for  ischemic evaluation.  Continue Eliquis, atorvastatin and Zetia.   Syncope/Hypotension/Hypokalemia: Recent hospital admission on 6/28 syncopal event in setting of AKI and profound hypotension, was also in atrial fibrillation. His Losartan and metoprolol were discontinued. Follow up labs on 05/15/23: sodium 142, potassium 4.0, creatinine 1.1. Blood pressure today 110/70. He denies any further syncope or presyncope, has been monitoring his blood pressure at home, averaging 120/80. No indication for additional testing at this time. Reviewed ED precautions. Check CBC, BMET, TSH and Mag level today.   Hyperlipidemia: Lipid profile on 05/15/2023 indicated total cholesterol of 99, triglycerides 84 and LDL 44. Continue rosuvastatin and ezetimibe.     Informed Consent   Shared Decision Making/Informed Consent The risks (stroke, cardiac arrhythmias rarely resulting in the need for a temporary or permanent pacemaker, skin irritation or burns and complications associated with conscious sedation including aspiration, arrhythmia, respiratory failure and death), benefits (restoration of normal sinus rhythm) and alternatives of a direct current cardioversion were explained in detail to Mr. Holsworth and he agrees to  proceed.       Dispo: Follow up in 2-3 weeks following cardioversion.   Signed, Rip Harbour, NP

## 2023-06-06 NOTE — H&P (View-Only) (Signed)
Cardiology Office Note:  .   Date:  06/07/2023  ID:  Parker Daniel, DOB May 21, 1956, MRN [id number] PCP: System, Provider Not In  [facility] Providers Cardiologist:  Reatha Harps, MD    History of Present Illness: .   Parker Daniel is a 67 y.o. male with past medical history of atrial fibrillation, DM, hypertension, obesity.  He presents today for follow-up regarding precordial pain.  He has known history of paroxysmal atrial fibrillation, and is followed by an electrophysiologist in [facility] system.  He had a TEE/cardioversion in 2022, he is currently on Eliquis 5 mg twice daily and Multaq 400 mg twice daily.  He was first seen by Dr. Flora Lipps on 12/08/2022 for shortness of breath and tightness in his chest.  He has a history of a elevated coronary calcium score.  He was agreeable to having a coronary CTA and echocardiogram.  He was previously on pravastatin and Zetia discussion was agreeable to transitioning to Lipitor with Zetia as his LDL goal is less than 70.  Coronary CTA on 12/21/2022 showed mild nonobstructive CAD, there was visual stenosis in the distal RCA that was felt to be more consistent with slab artifact.  The proximal LAD had mixed calcified and noncalcified plaque with 25 to 49% stenosis.  Coronary calcium score was 258, this was 74th percentile for age and sex matched control.  There was borderline dilation of the mid ascending aorta at 38 mm. His echocardiogram on 01/02/2023 indicated LVEF of 60 to 65%, LV with normal function, no RWMA and diastolic parameters were normal.  No valve abnormalities.  05/11/2023 he presented to the emergency room following a syncopal event at home that resulted in a fall, his blood pressure at home indicated systolics in the 60s.  He was admitted to the hospital with acute kidney injury in setting of profound hypotension felt this was likely as a result of working outdoors and not hydrating enough with use of antihypertensives.  His losartan and  metoprolol were held and he received IV fluids.  His creatinine returned to baseline he was also found to be hypokalemic potassium replaced.  CT scans completed as trauma workup were unremarkable. He was found to have a indeterminate right renal lesion at 2.1 cm, recommended to have renal MRI with PCP.  Today reports that he has been doing well overall.  He denies further syncopal episodes or presyncope.  He did follow-up with his primary care following event at the end of June with repeat labs that were stable.  He denies any chest pain but does endorses some intermittent shortness of breath that generally will occur at rest.  He questions if he has been going in and out of atrial fibrillation as this is similar to his presentation with prior atrial fibrillation in 2022. EKG today indicates atrial fibrillation at 60 bpm.   ROS: Today he denies chest pain, lower extremity edema, fatigue, palpitations, melena, hematuria, hemoptysis, diaphoresis, weakness, presyncope, syncope, orthopnea, and PND.  Studies Reviewed: Marland Kitchen   EKG Interpretation Date/Time:  Thursday June 07 2023 14:43:14 EDT Ventricular Rate:  60 PR Interval:    QRS Duration:  92 QT Interval:  452 QTC Calculation: 452 R Axis:   24  Text Interpretation: Atrial fibrillation Nonspecific T wave abnormality Confirmed by Reather Littler [phone]) on 06/07/2023 2:46:37 PM    Risk Assessment/Calculations:    CHA2DS2-VASc Score = 4   This indicates a 4.8% annual risk of stroke. The patient's score is based upon:  CHF History: 0 HTN History: 1 Diabetes History: 1 Stroke History: 0 Vascular Disease History: 1 Age Score: 1 Gender Score: 0            Physical Exam:   VS:  BP 110/70 (BP Location: Left Arm, Patient Position: Sitting, Cuff Size: Normal)   Pulse 60   Ht 5\' 10"  (1.778 m)   Wt 238 lb (108 kg)   SpO2 95%   BMI 34.15 kg/m    Wt Readings from Last 3 Encounters:  06/07/23 238 lb (108 kg)  05/11/23 231 lb 14.8 oz (105.2 kg)   12/08/22 261 lb (118.4 kg)    GEN: Well nourished, well developed in no acute distress NECK: No JVD; No carotid bruits CARDIAC: Irregular RR, no murmurs, rubs, gallops RESPIRATORY:  Clear to auscultation without rales, wheezing or rhonchi  ABDOMEN: Soft, non-tender, non-distended EXTREMITIES:  No edema; No deformity   ASSESSMENT AND PLAN: .    Persistent Atrial fibrillation/anticoagulation: Known history of paroxysmal atrial fibrillation, required cardioversion in 2022, had been maintained on Multaq and Eliquis. Previously followed by EP in [facility], planning to transition fully to [facility] for EP as well.  EKG during admission on 05/11/23 for syncopal event and hypotension indicates atrial fibrillation. Per patient he was told he converted back to sinus but no EKG indicates this. Today he presents for follow up, reports intermittent shortness of breath, questions if he has been going in and out of afib. EKG today shows atrial fibrillation at 60bpm. He confirms compliance with Eliquis. Discussed with Dr. Flora Lipps who recommended DCCV with referral to EP for possible ablation in the future. Patient is agreeable to this, see consent below, discussed importance of continuing Eliquis 5mg  twice daily. He will hold his Ozempic tomorrow. Reviewed ED precautions. Check CBC, BMET, TSH and Mag today. Continue Eliquis 5mg  twice daily and Multaq 400mg  twice daily. Cardioversion scheduled for Monday, 7/29 with Dr. Royann Shivers.  CHA2DS2-VASc Score = 4 [CHF History: 0, HTN History: 1, Diabetes History: 1, Stroke History: 0, Vascular Disease History: 1, Age Score: 1, Gender Score: 0].  Therefore, the patient's annual risk of stroke is 4.8 %.         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  Echo on 12/2022 indicated EF of 60 to 65%. Stable with no anginal symptoms. No indication for  ischemic evaluation.  Continue Eliquis, atorvastatin and Zetia.   Syncope/Hypotension/Hypokalemia: Recent hospital admission on 6/28 syncopal event in setting of AKI and profound hypotension, was also in atrial fibrillation. His Losartan and metoprolol were discontinued. Follow up labs on 05/15/23: sodium 142, potassium 4.0, creatinine 1.1. Blood pressure today 110/70. He denies any further syncope or presyncope, has been monitoring his blood pressure at home, averaging 120/80. No indication for additional testing at this time. Reviewed ED precautions. Check CBC, BMET, TSH and Mag level today.   Hyperlipidemia: Lipid profile on 05/15/2023 indicated total cholesterol of 99, triglycerides 84 and LDL 44. Continue rosuvastatin and ezetimibe.     Informed Consent   Shared Decision Making/Informed Consent The risks (stroke, cardiac arrhythmias rarely resulting in the need for a temporary or permanent pacemaker, skin irritation or burns and complications associated with conscious sedation including aspiration, arrhythmia, respiratory failure and death), benefits (restoration of normal sinus rhythm) and alternatives of a direct current cardioversion were explained in detail to Mr. Kirtz and he agrees to  proceed.       Dispo: Follow up in 2-3 weeks following cardioversion.   Signed, Rip Harbour, NP

## 2023-06-07 ENCOUNTER — Ambulatory Visit: Payer: Medicare HMO | Attending: Nurse Practitioner | Admitting: Cardiology

## 2023-06-07 ENCOUNTER — Encounter: Payer: Self-pay | Admitting: Cardiology

## 2023-06-07 VITALS — BP 110/70 | HR 60 | Ht 70.0 in | Wt 238.0 lb

## 2023-06-07 DIAGNOSIS — Z87898 Personal history of other specified conditions: Secondary | ICD-10-CM

## 2023-06-07 DIAGNOSIS — E785 Hyperlipidemia, unspecified: Secondary | ICD-10-CM

## 2023-06-07 DIAGNOSIS — E876 Hypokalemia: Secondary | ICD-10-CM

## 2023-06-07 DIAGNOSIS — I4819 Other persistent atrial fibrillation: Secondary | ICD-10-CM

## 2023-06-07 DIAGNOSIS — E1169 Type 2 diabetes mellitus with other specified complication: Secondary | ICD-10-CM

## 2023-06-07 DIAGNOSIS — D6859 Other primary thrombophilia: Secondary | ICD-10-CM

## 2023-06-07 DIAGNOSIS — Z7985 Long-term (current) use of injectable non-insulin antidiabetic drugs: Secondary | ICD-10-CM

## 2023-06-07 DIAGNOSIS — I2510 Atherosclerotic heart disease of native coronary artery without angina pectoris: Secondary | ICD-10-CM | POA: Diagnosis not present

## 2023-06-07 DIAGNOSIS — R0602 Shortness of breath: Secondary | ICD-10-CM

## 2023-06-07 NOTE — Patient Instructions (Signed)
Medication Instructions:  Your physician recommends that you continue on your current medications as directed. Please refer to the Current Medication list given to you today.  *If you need a refill on your cardiac medications before your next appointment, please call your pharmacy*   Lab Work: CBC and BMET today.  If you have labs (blood work) drawn today and your tests are completely normal, you will receive your results only by: MyChart Message (if you have MyChart) OR A paper copy in the mail If you have any lab test that is abnormal or we need to change your treatment, we will call you to review the results.   Testing/Procedures:       Dear Parker Daniel  You are scheduled for a Cardioversion on Monday, July 29 with Dr. Royann Shivers.  Please arrive at the [facility] (Main Entrance A) at [facility]: [address] [place], [place] [address] at 12:00 PM (This time is 1.5 hour(s) before your procedure to ensure your preparation). Free valet parking service is available. You will check in at ADMITTING. The support person will be asked to wait in the waiting room.  It is OK to have someone drop you off and come back when you are ready to be discharged.      DIET:  Nothing to eat or drink after midnight except a sip of water with medications (see medication instructions below)  MEDICATION INSTRUCTIONS: !!IF ANY NEW MEDICATIONS ARE STARTED AFTER TODAY, PLEASE NOTIFY YOUR PROVIDER AS SOON AS POSSIBLE!!  FYI: Medications such as Semaglutide (Ozempic, [place]), Tirzepatide (Mounjaro, Zepbound), Dulaglutide (Trulicity), etc ("GLP1 agonists") AND Canagliflozin (Invokana), Dapagliflozin (Farxiga), Empagliflozin (Jardiance), Ertugliflozin (Steglatro), Bexagliflozin [employer]) or any combination with one of these drugs such as Invokamet (Canagliflozin/Metformin), Synjardy (Empagliflozin/Metformin), etc ("SGLT2 inhibitors") must be held around the time of a procedure. This is not a comprehensive  list of all of these drugs. Please review all of your medications and talk to your provider if you take any one of these. If you are not sure, ask your provider.  HOLD: Semaglutide (Ozempic, Rybelsus, Wegovy) for 1 week prior to the procedure. Last dose on Friday, July 19.   Continue taking your anticoagulant (blood thinner): Apixaban (Eliquis).  You will need to continue this after your procedure until you are told by your provider that it is safe to stop.    LABS:     FYI:  For your safety, and to allow [place] to monitor your vital signs accurately during the surgery/procedure we request: If you have artificial nails, gel coating, SNS etc, please have those removed prior to your surgery/procedure. Not having the nail coverings /polish removed may result in cancellation or delay of your surgery/procedure.  You must have a responsible person to drive you home and stay in the waiting area during your procedure. Failure to do so could result in cancellation.  Bring your insurance cards.  *Special Note: Every effort is made to have your procedure done on time. Occasionally there are emergencies that occur at the hospital that may cause delays. Please be patient if a delay does occur.       Follow-Up: At [facility], you and your health needs are our priority.  As part of our continuing mission to provide you with exceptional heart care, we have created designated Provider Care Teams.  These Care Teams include your primary Cardiologist (physician) and Advanced Practice Providers (APPs -  Physician Assistants and Nurse Practitioners) who all work together to  provide you with the care you need, when you need it.  We recommend signing up for the patient portal called "MyChart".  Sign up information is provided on this After Visit Summary.  MyChart is used to connect with patients for Virtual Visits (Telemedicine).  Patients are able to view lab/test results, encounter notes, upcoming  appointments, etc.  Non-urgent messages can be sent to your provider as well.   To learn more about what you can do with MyChart, go to [email or web address].    Your next appointment:   2 week(s)  Provider:   Azalee Course, PA-C        Other Instructions Your provider has referred you to EP (electrophysiology)

## 2023-06-08 LAB — BASIC METABOLIC PANEL: Potassium: 4.8 mmol/L (ref 3.5–5.2)

## 2023-06-08 NOTE — OR Nursing (Signed)
Called patient with pre-procedure instructions for tomorrow.   Patient informed of:   Time to arrive for procedure.1215 Remain NPO past midnight. Agreed. Must have a ride home and a responsible adult to remain with them for 24 hours post procedure.  Confirmed blood thinner. Eliquis. Confirmed no breaks in taking blood thinner for 3+ weeks prior to procedure.Confirmed. Confirmed patient stopped all GLP-1s and GLP-2s for at least one week before procedure. Confirmed- Ozempic 1 week ago.  Spoke to patient. Confirmed above information.

## 2023-06-11 ENCOUNTER — Other Ambulatory Visit: Payer: Self-pay

## 2023-06-11 ENCOUNTER — Ambulatory Visit (HOSPITAL_COMMUNITY): Payer: Medicare HMO | Admitting: Certified Registered"

## 2023-06-11 ENCOUNTER — Ambulatory Visit (HOSPITAL_COMMUNITY)
Admission: RE | Admit: 2023-06-11 | Discharge: 2023-06-11 | Disposition: A | Discharge status: Home / Self Care | Payer: Medicare HMO | Attending: Cardiovascular Disease | Admitting: Cardiovascular Disease

## 2023-06-11 ENCOUNTER — Encounter (HOSPITAL_COMMUNITY)
Admission: RE | Disposition: A | Discharge status: Home / Self Care | Payer: Self-pay | Source: Home / Self Care | Attending: Cardiovascular Disease

## 2023-06-11 ENCOUNTER — Ambulatory Visit (HOSPITAL_BASED_OUTPATIENT_CLINIC_OR_DEPARTMENT_OTHER): Payer: Medicare HMO | Admitting: Certified Registered"

## 2023-06-11 DIAGNOSIS — E785 Hyperlipidemia, unspecified: Secondary | ICD-10-CM | POA: Insufficient documentation

## 2023-06-11 DIAGNOSIS — I2510 Atherosclerotic heart disease of native coronary artery without angina pectoris: Secondary | ICD-10-CM | POA: Insufficient documentation

## 2023-06-11 DIAGNOSIS — Z7901 Long term (current) use of anticoagulants: Secondary | ICD-10-CM | POA: Diagnosis not present

## 2023-06-11 DIAGNOSIS — E119 Type 2 diabetes mellitus without complications: Secondary | ICD-10-CM | POA: Diagnosis not present

## 2023-06-11 DIAGNOSIS — I4819 Other persistent atrial fibrillation: Secondary | ICD-10-CM

## 2023-06-11 DIAGNOSIS — I10 Essential (primary) hypertension: Secondary | ICD-10-CM | POA: Insufficient documentation

## 2023-06-11 DIAGNOSIS — Z79899 Other long term (current) drug therapy: Secondary | ICD-10-CM | POA: Insufficient documentation

## 2023-06-11 DIAGNOSIS — I4891 Unspecified atrial fibrillation: Secondary | ICD-10-CM | POA: Diagnosis not present

## 2023-06-11 DIAGNOSIS — Z6834 Body mass index (BMI) 34.0-34.9, adult: Secondary | ICD-10-CM | POA: Diagnosis not present

## 2023-06-11 DIAGNOSIS — E669 Obesity, unspecified: Secondary | ICD-10-CM | POA: Insufficient documentation

## 2023-06-11 HISTORY — PX: CARDIOVERSION: SHX1299

## 2023-06-11 LAB — GLUCOSE, CAPILLARY: Glucose-Capillary: 101 mg/dL — ABNORMAL HIGH (ref 70–99)

## 2023-06-11 SURGERY — CARDIOVERSION
Anesthesia: General

## 2023-06-11 MED ORDER — PROPOFOL 10 MG/ML IV BOLUS
INTRAVENOUS | Status: DC | PRN
Start: 2023-06-11 — End: 2023-06-11
  Administered 2023-06-11: 80 mg via INTRAVENOUS

## 2023-06-11 MED ORDER — SODIUM CHLORIDE 0.9 % IV SOLN: INTRAVENOUS | Status: DC

## 2023-06-11 MED ORDER — LIDOCAINE 2% (20 MG/ML) 5 ML SYRINGE
INTRAMUSCULAR | Status: DC | PRN
  Administered 2023-06-11: 40 mg via INTRAVENOUS

## 2023-06-11 SURGICAL SUPPLY — 1 items: ELECT DEFIB PAD ADLT CADENCE (PAD) ×1 IMPLANT

## 2023-06-11 NOTE — Op Note (Addendum)
Procedure: Electrical Cardioversion Indications:  Atrial Fibrillation  Procedure Details:  Consent: Risks of procedure as well as the alternatives and risks of each were explained to the (patient/caregiver).  Consent for procedure obtained.  Time Out: Verified patient identification, verified procedure, site/side was marked, verified correct patient position, special equipment/implants available, medications/allergies/relevent history reviewed, required imaging and test results available.  Performed  Patient placed on cardiac monitor, pulse oximetry, supplemental oxygen as necessary.  Sedation given:  propofol IV Dr. Chaney Malling Pacer pads placed anterior and posterior chest.  Cardioverted 1 time(s).  Cardioversion with synchronized biphasic 150J shock.  Evaluation: Findings: Post procedure EKG shows: NSR Complications: None Patient did tolerate procedure well.   Time Spent Directly with the Patient:  30 minutes   Parker Daniel 06/11/2023, 11:56 AM

## 2023-06-11 NOTE — Interval H&P Note (Signed)
History and Physical Interval Note:  06/11/2023 11:02 AM  Parker Daniel  has presented today for surgery, with the diagnosis of AFIB.  The various methods of treatment have been discussed with the patient and family. After consideration of risks, benefits and other options for treatment, the patient has consented to  Procedure(s): CARDIOVERSION (N/A) as a surgical intervention.  The patient's history has been reviewed, patient examined, no change in status, stable for surgery.  I have reviewed the patient's chart and labs.  Questions were answered to the patient's satisfaction.     Cassandr Cederberg

## 2023-06-11 NOTE — Transfer of Care (Signed)
Immediate Anesthesia Transfer of Care Note  Patient: Parker Daniel  Procedure(s) Performed: CARDIOVERSION  Patient Location: Cath Lab  Anesthesia Type:General  Level of Consciousness: drowsy and patient cooperative  Airway & Oxygen Therapy: Patient Spontanous Breathing and Patient connected to nasal cannula oxygen  Post-op Assessment: Report given to RN and Post -op Vital signs reviewed and stable  Post vital signs: Reviewed and stable  Last Vitals:  Vitals Value Taken Time  BP    Temp    Pulse    Resp    SpO2      Last Pain:  Vitals:   06/11/23 1042  TempSrc: Temporal  PainSc: 0-No pain         Complications: No notable events documented.

## 2023-06-12 ENCOUNTER — Encounter (HOSPITAL_COMMUNITY): Payer: Self-pay | Admitting: Cardiovascular Disease

## 2023-06-12 ENCOUNTER — Telehealth: Payer: Self-pay | Admitting: Cardiology

## 2023-06-12 NOTE — Anesthesia Preprocedure Evaluation (Signed)
Anesthesia Evaluation  Patient identified by MRN, date of birth, ID band Patient awake    Reviewed: Allergy & Precautions, H&P , NPO status , Patient's Chart, lab work & pertinent test results  Airway Mallampati: II   Neck ROM: full    Dental   Pulmonary shortness of breath   breath sounds clear to auscultation       Cardiovascular hypertension, + CAD  + dysrhythmias Atrial Fibrillation  Rhythm:irregular Rate:Normal     Neuro/Psych    GI/Hepatic ,GERD  ,,  Endo/Other  diabetes, Type 2    Renal/GU      Musculoskeletal  (+) Arthritis ,    Abdominal   Peds  Hematology   Anesthesia Other Findings   Reproductive/Obstetrics                              Anesthesia Physical Anesthesia Plan  ASA: 3  Anesthesia Plan: General   Post-op Pain Management:    Induction: Intravenous  PONV Risk Score and Plan: 2 and Propofol infusion and Treatment may vary due to age or medical condition  Airway Management Planned: Nasal Cannula  Additional Equipment:   Intra-op Plan:   Post-operative Plan:   Informed Consent: I have reviewed the patients History and Physical, chart, labs and discussed the procedure including the risks, benefits and alternatives for the proposed anesthesia with the patient or authorized representative who has indicated his/her understanding and acceptance.     Dental advisory given  Plan Discussed with: CRNA, Anesthesiologist and Surgeon  Anesthesia Plan Comments:          Anesthesia Quick Evaluation

## 2023-06-12 NOTE — Anesthesia Postprocedure Evaluation (Signed)
Anesthesia Post Note  Patient: Parker Daniel  Procedure(s) Performed: CARDIOVERSION     Patient location during evaluation: Cath Lab Anesthesia Type: General Level of consciousness: awake and alert Pain management: pain level controlled Vital Signs Assessment: post-procedure vital signs reviewed and stable Respiratory status: spontaneous breathing, nonlabored ventilation, respiratory function stable and patient connected to nasal cannula oxygen Cardiovascular status: blood pressure returned to baseline and stable Postop Assessment: no apparent nausea or vomiting Anesthetic complications: no   No notable events documented.  Last Vitals:  Vitals:   06/11/23 1215 06/11/23 1220  BP: 113/82 113/87  Pulse: 66 67  Resp: 15 14  Temp:    SpO2: 93% 94%    Last Pain:  Vitals:   06/11/23 1220  TempSrc:   PainSc: 0-No pain                 Adara Kittle S

## 2023-06-12 NOTE — Telephone Encounter (Signed)
Called patient to discuss question of cardiac monitor, unable to reach patient and his voicemail box is full. He does not need cardiac monitor at this time.   Will send mychart message to patient.

## 2023-06-12 NOTE — Telephone Encounter (Deleted)
-----   Message from [facility] sent at 06/11/2023 12:28 PM EDT ----- He was expecting to get a rhythm monitor. Still planning that for him? Sorry for [employer] on him. He stayed in sinus rhythm. He did not have a post conversion pauise.

## 2023-06-25 NOTE — Progress Notes (Unsigned)
Cardiology Office Note:  .   Date:  06/26/2023  ID:  Parker Daniel, DOB Feb 17, 1956, MRN [id number] PCP: System, Provider Not In  [facility] Providers Cardiologist:  Reatha Harps, MD    History of Present Illness: .   XXAVIER Daniel is a 67 y.o. male with past medical history of atrial fibrillation, DM, hypertension, obesity.  He presents today for follow-up regarding precordial pain.   He has known history of paroxysmal atrial fibrillation, and is followed by an electrophysiologist in [facility] system.  He had a TEE/cardioversion in 2022, he is currently on Eliquis 5 mg twice daily and Multaq 400 mg twice daily.   He was first seen by Dr. Flora Lipps on 12/08/2022 for shortness of breath and tightness in his chest.  He has a history of a elevated coronary calcium score.  He was agreeable to having a coronary CTA and echocardiogram.  He was previously on pravastatin and Zetia discussion was agreeable to transitioning to Lipitor with Zetia as his LDL goal is less than 70.  Coronary CTA on 12/21/2022 showed mild nonobstructive CAD, there was visual stenosis in the distal RCA that was felt to be more consistent with slab artifact.  The proximal LAD had mixed calcified and noncalcified plaque with 25 to 49% stenosis.  Coronary calcium score was 258, this was 74th percentile for age and sex matched control.  There was borderline dilation of the mid ascending aorta at 38 mm. His echocardiogram on 01/02/2023 indicated LVEF of 60 to 65%, LV with normal function, no RWMA and diastolic parameters were normal.  No valve abnormalities.   05/11/2023 he presented to the emergency room following a syncopal event at home that resulted in a fall, his blood pressure at home indicated systolics in the 60s.  He was admitted to the hospital with acute kidney injury in setting of profound hypotension felt this was likely as a result of working outdoors and not hydrating enough with use of antihypertensives.  His losartan  and metoprolol were held and he received IV fluids.  His creatinine returned to baseline he was also found to be hypokalemic potassium replaced.  CT scans completed as trauma workup were unremarkable. He was found to have a indeterminate right renal lesion at 2.1 cm, recommended to have renal MRI with PCP.  On follow-up on 06/07/2023 he was noted to be in atrial fibrillation.  He confirmed compliance with Eliquis.  Discussed with Dr. Bufford Buttner who recommended DCCV with referral to EP for possible ablation in the future. On 06/11/23 he underwent successful DCCV with conversion to NSR.   Today he presents for follow up. He reports he is doing very well overall.  He denies any further shortness of breath or palpitations similar to atrial fibrillation.  He has returned to his normal activity and has been working on houses.  He reports that he has a Kardia mobile at home, he has not been monitoring his rhythms.  ROS: Today he denies chest pain, shortness of breath, lower extremity edema, fatigue, palpitations, melena, hematuria, hemoptysis, diaphoresis, weakness, presyncope, syncope, orthopnea, and PND.   Studies Reviewed: Marland Kitchen   EKG Interpretation Date/Time:  Tuesday June 26 2023 14:23:21 EDT Ventricular Rate:  65 PR Interval:  220 QRS Duration:  98 QT Interval:  398 QTC Calculation: 413 R Axis:   -9  Text Interpretation: Sinus rhythm with 1st degree A-V block Nonspecific ST and T wave abnormality Confirmed by Reather Littler [phone]) on 06/26/2023 2:47:49 PM  Cardiac Studies & Procedures       ECHOCARDIOGRAM  ECHOCARDIOGRAM COMPLETE 01/02/2023  Narrative ECHOCARDIOGRAM REPORT    Patient Name:   Parker Daniel  Date of Exam: 01/02/2023 Medical Rec #:  [id number]     Height:       70.0 in Accession #:    [id number]    Weight:       261.0 lb Date of Birth:  [birth year]     BSA:          2.337 m Patient Age:    66 years      BP:           102/68 mmHg Patient Gender: M             HR:           69  bpm. Exam Location:  [address]  Procedure: 2D Echo, Cardiac Doppler and Color Doppler  Indications:    R06.00 SOB  History:        Patient has no prior history of Echocardiogram examinations. Arrythmias:Paroxysmal atrial fibrillation, Signs/Symptoms:Shortness of Breath; Risk Factors:Hypertension, Dyslipidemia, Obesity and Diabetes.  Sonographer:    Samule Ohm RDCS Referring Phys: [id number] Ronnald Ramp O'NEAL  IMPRESSIONS   1. Left ventricular ejection fraction, by estimation, is 60 to 65%. The left ventricle has normal function. The left ventricle has no regional wall motion abnormalities. Left ventricular diastolic parameters were normal. 2. Right ventricular systolic function is normal. The right ventricular size is normal. 3. The mitral valve is normal in structure. No evidence of mitral valve regurgitation. No evidence of mitral stenosis. 4. The aortic valve is normal in structure. Aortic valve regurgitation is not visualized. No aortic stenosis is present. 5. The inferior vena cava is normal in size with greater than 50% respiratory variability, suggesting right atrial pressure of 3 mmHg.  FINDINGS Left Ventricle: Left ventricular ejection fraction, by estimation, is 60 to 65%. The left ventricle has normal function. The left ventricle has no regional wall motion abnormalities. The left ventricular internal cavity size was normal in size. There is no left ventricular hypertrophy. Left ventricular diastolic parameters were normal.  Right Ventricle: The right ventricular size is normal. No increase in right ventricular wall thickness. Right ventricular systolic function is normal.  Left Atrium: Left atrial size was normal in size.  Right Atrium: Right atrial size was normal in size.  Pericardium: There is no evidence of pericardial effusion.  Mitral Valve: The mitral valve is normal in structure. No evidence of mitral valve regurgitation. No evidence of mitral valve  stenosis.  Tricuspid Valve: The tricuspid valve is normal in structure. Tricuspid valve regurgitation is not demonstrated. No evidence of tricuspid stenosis.  Aortic Valve: The aortic valve is normal in structure. Aortic valve regurgitation is not visualized. No aortic stenosis is present.  Pulmonic Valve: The pulmonic valve was normal in structure. Pulmonic valve regurgitation is not visualized. No evidence of pulmonic stenosis.  Aorta: The aortic root is normal in size and structure.  Venous: The inferior vena cava is normal in size with greater than 50% respiratory variability, suggesting right atrial pressure of 3 mmHg.  IAS/Shunts: No atrial level shunt detected by color flow Doppler.   LEFT VENTRICLE PLAX 2D LVIDd:         4.70 cm   Diastology LVIDs:         3.00 cm   LV e' medial:    5.33 cm/s LV PW:  1.40 cm   LV E/e' medial:  10.1 LV IVS:        1.60 cm   LV e' lateral:   14.30 cm/s LVOT diam:     2.20 cm   LV E/e' lateral: 3.8 LV SV:         86 LV SV Index:   37 LVOT Area:     3.80 cm   RIGHT VENTRICLE             IVC RV S prime:     19.10 cm/s  IVC diam: 0.70 cm TAPSE (M-mode): 2.6 cm  LEFT ATRIUM             Index        RIGHT ATRIUM           Index LA diam:        4.60 cm 1.97 cm/m   RA Pressure: 3.00 mmHg LA Vol (A2C):   65.6 ml 28.07 ml/m  RA Area:     16.60 cm LA Vol (A4C):   57.3 ml 24.51 ml/m  RA Volume:   48.90 ml  20.92 ml/m LA Biplane Vol: 67.6 ml 28.92 ml/m AORTIC VALVE LVOT Vmax:   109.00 cm/s LVOT Vmean:  74.500 cm/s LVOT VTI:    0.226 m  AORTA Ao Root diam: 4.10 cm Ao Asc diam:  4.00 cm  MITRAL VALVE               TRICUSPID VALVE MV Area (PHT): 1.60 cm    Estimated RAP:  3.00 mmHg MV Decel Time: 474 msec MV E velocity: 54.00 cm/s  SHUNTS MV A velocity: 76.50 cm/s  Systemic VTI:  0.23 m MV E/A ratio:  0.71        Systemic Diam: 2.20 cm  Aditya Sabharwal Electronically signed by Dorthula Nettles Signature Date/Time:  01/02/2023/4:46:34 PM    Final     CT SCANS  CT CORONARY MORPH W/CTA COR W/SCORE 12/21/2022  Addendum 12/23/2022 12:20 PM ADDENDUM REPORT: 12/23/2022 12:17  CLINICAL DATA:  This over-read does not include interpretation of cardiac or coronary anatomy or pathology. The coronary CTA interpretation by the cardiologist is attached.  COMPARISON:  None available.  FINDINGS: No suspicious nodules, masses, or infiltrates are identified in the visualized portion of the lungs. No pleural fluid seen.  The visualized portions of the mediastinum and chest wall are unremarkable.  IMPRESSION: No significant non-cardiac abnormality identified.   Electronically Signed By: Danae Orleans M.D. On: 12/23/2022 12:17  Narrative HISTORY: Chest pain/anginal equiv, intermediate CAD risk, not treadmill candidate  EXAM: Cardiac/Coronary CT  TECHNIQUE: The patient was scanned on a [employer].  PROTOCOL: A 120 kV prospective scan was triggered in the descending thoracic aorta at 111 HU's. Axial non-contrast 3 mm slices were carried out through the heart. The data set was analyzed on a dedicated work station and scored using the Agatston method. Gantry rotation speed was 250 msecs and collimation was 0.6 mm. Heart rate was optimized medically and sl NTG was given. The 3D data set was reconstructed in 5% intervals of the 35-75 % of the R-R cycle. Systolic and diastolic phases were analyzed on a dedicated work station using MPR, MIP and VRT modes. The patient received OMNIPAQUE IOHEXOL 350 MG/ML SOLN of contrast.  FINDINGS: Coronary calcium score: The patient's coronary artery calcium score is 258, which places the patient in the 74th percentile.  Coronary arteries: Normal coronary origins.  Right dominance.  Right Coronary Artery:  Normal caliber vessel, gives rise to PDA. Proximal vessel with scattered mixed calcified and predominantly noncalcified plaque with 1-24%  stenosis. Midvessel with noncalcified plaque and focal 25-49% stenosis. The distal vessel has an area that visually appears stenotic, but this occurs at the location of slab artifact and changing angle of the vessel. This stenosis is only mild in the early diastolic views, suspect it is artifact.  Left Main Coronary Artery: Normal caliber vessel. Noncalcified plaque with 1-24% stenosis.  Left Anterior Descending Coronary Artery: Normal caliber vessel. Proximal LAD with mixed calcified and noncalcified plaque with 25-49% stenosis. Gives rise to 3 small diagonal branches.  Left Circumflex Artery: Normal caliber vessel. Predominantly noncalcified plaque . Gives rise to small first and medium second OM branches.  Aorta: Borderline dilated size, 38 mm at the mid ascending aorta (level of the PA bifurcation) measured double oblique. Trivial aortic atherosclerosis. No dissection seen in visualized portions of the aorta.  Aortic Valve: No calcifications. Trileaflet.  Other findings:  Normal pulmonary vein drainage into the left atrium.  Normal left atrial appendage without a thrombus.  Normal size of the pulmonary artery.  Normal appearance of the pericardium.  IMPRESSION: 1. Mild non-obstructive CAD, CADRADS = 2. See comments re: Visual stenosis in distal RCA that is more consistent with slab artifact.  2. Coronary calcium score of 258. This was 74th percentile for age and sex matched control.  3. Normal coronary origin with right dominance.  INTERPRETATION:  CAD-RADS 2: Mild non-obstructive CAD (25-49%). Consider non-atherosclerotic causes of chest pain. Consider preventive therapy and risk factor modification.  Electronically Signed: By: Jodelle Red M.D. On: 12/22/2022 07:46          Risk Assessment/Calculations:    CHA2DS2-VASc Score = 4   This indicates a 4.8% annual risk of stroke. The patient's score is based upon: CHF History: 0 HTN History:  1 Diabetes History: 1 Stroke History: 0 Vascular Disease History: 1 Age Score: 1 Gender Score: 0            Physical Exam:   VS:  BP 126/82   Pulse 65   Ht 5\' 10"  (1.778 m)   Wt 239 lb 12.8 oz (108.8 kg)   SpO2 95%   BMI 34.41 kg/m    Wt Readings from Last 3 Encounters:  06/26/23 239 lb 12.8 oz (108.8 kg)  06/11/23 230 lb (104.3 kg)  06/07/23 238 lb (108 kg)    GEN: Well nourished, well developed in no acute distress NECK: No JVD; No carotid bruits CARDIAC: RRR, no murmurs, rubs, gallops RESPIRATORY:  Clear to auscultation without rales, wheezing or rhonchi  ABDOMEN: Soft, non-tender, non-distended EXTREMITIES:  No edema; No deformity   ASSESSMENT AND PLAN: .    Paroxysmal Atrial fibrillation: Known history of paroxysmal atrial fibrillation, required cardioversion in 2022, had been maintained on Multaq and Eliquis. Previously followed by EP in [facility], planning to transition fully to [facility] for EP as well. EKG during admission on 05/11/23 for syncopal event and hypotension indicates atrial fibrillation. Per patient he was told he converted back to sinus although no EKG indicates this. On OV on 06/07/23 he was noted to be in Afib. Underwent successful DCCV on 7/29 with conversion to NSR. EKG indicates NSR with 1st degree AV block. He reports he is doing well overall.  Discussed monitoring for atrial fibrillation with Kardia mobile, he does have a device at home.  Continue Eliquis and Multaq. Follow up with Dr. Elberta Fortis as scheduled on 08/02/23.  Nonobstuctive CAD: Coronary CTA on 12/21/2022 showed mild nonobstructive CAD, there was visual stenosis in the distal RCA that was felt to be more consistent with slab artifact.  Echo on 12/2022 indicated EF of 60 to 65%. Stable with no anginal symptoms. No indication for ischemic evaluation.  Continue Eliquis, atorvastatin and Zetia.   Hyperlipidemia: Lipid profile on 05/15/2023 indicated total cholesterol of 99, triglycerides 84 and LDL 44.  Continue rosuvastatin and ezetimibe.   Syncope/Hypotension: Hospital admission on 6/28 for syncopal event in setting of AKI and profound hypotension, was also in atrial fibrillation. His Losartan and metoprolol were discontinued. Follow up labs on 06/07/23: sodium 138, potassium 4.8, creatinine 1.13, magnesium 2.4. He denies any further syncope or presyncope. No indication for additional testing at this time. Reviewed ED precautions.        Dispo: Follow up with Dr. Flora Lipps in 6 months. Follow up with Dr. Elberta Fortis as scheduled on 08/02/23.   Signed, Rip Harbour, NP

## 2023-06-26 ENCOUNTER — Ambulatory Visit: Payer: Medicare HMO | Attending: Physician Assistant | Admitting: Cardiology

## 2023-06-26 ENCOUNTER — Encounter: Payer: Self-pay | Admitting: Physician Assistant

## 2023-06-26 VITALS — BP 126/82 | HR 65 | Ht 70.0 in | Wt 239.8 lb

## 2023-06-26 DIAGNOSIS — E785 Hyperlipidemia, unspecified: Secondary | ICD-10-CM

## 2023-06-26 DIAGNOSIS — I4819 Other persistent atrial fibrillation: Secondary | ICD-10-CM

## 2023-06-26 DIAGNOSIS — D6859 Other primary thrombophilia: Secondary | ICD-10-CM

## 2023-06-26 DIAGNOSIS — Z87898 Personal history of other specified conditions: Secondary | ICD-10-CM

## 2023-06-26 DIAGNOSIS — I2510 Atherosclerotic heart disease of native coronary artery without angina pectoris: Secondary | ICD-10-CM

## 2023-06-26 DIAGNOSIS — E1169 Type 2 diabetes mellitus with other specified complication: Secondary | ICD-10-CM

## 2023-06-26 NOTE — Patient Instructions (Signed)
Medication Instructions:  NO CHANGES *If you need a refill on your cardiac medications before your next appointment, please call your pharmacy*   Lab Work: NO LABS If you have labs (blood work) drawn today and your tests are completely normal, you will receive your results only by: MyChart Message (if you have MyChart) OR A paper copy in the mail If you have any lab test that is abnormal or we need to change your treatment, we will call you to review the results.   Testing/Procedures: NO TESTING   Follow-Up: At [facility], you and your health needs are our priority.  As part of our continuing mission to provide you with exceptional heart care, we have created designated Provider Care Teams.  These Care Teams include your primary Cardiologist (physician) and Advanced Practice Providers (APPs -  Physician Assistants and Nurse Practitioners) who all work together to provide you with the care you need, when you need it.    Your next appointment:   6 month(s)  Provider:   Reatha Harps, MD

## 2023-07-17 ENCOUNTER — Ambulatory Visit: Payer: Medicare HMO | Admitting: Internal Medicine

## 2023-07-31 ENCOUNTER — Encounter: Payer: Self-pay | Admitting: Cardiology

## 2023-07-31 ENCOUNTER — Ambulatory Visit: Payer: Medicare HMO | Attending: Internal Medicine | Admitting: Cardiology

## 2023-07-31 VITALS — BP 134/80 | HR 62 | Ht 70.0 in | Wt 236.8 lb

## 2023-07-31 DIAGNOSIS — D6869 Other thrombophilia: Secondary | ICD-10-CM

## 2023-07-31 DIAGNOSIS — I4819 Other persistent atrial fibrillation: Secondary | ICD-10-CM

## 2023-07-31 NOTE — Patient Instructions (Addendum)
Medication Instructions:  Your physician recommends that you continue on your current medications as directed. Please refer to the Current Medication list given to you today.  *If you need a refill on your cardiac medications before your next appointment, please call your pharmacy*   Lab Work: None ordered If you have labs (blood work) drawn today and your tests are completely normal, you will receive your results only by: MyChart Message (if you have MyChart) OR A paper copy in the mail If you have any lab test that is abnormal or we need to change your treatment, we will call you to review the results.   Testing/Procedures: None ordered   Follow-Up: At [facility], you and your health needs are our priority.  As part of our continuing mission to provide you with exceptional heart care, we have created designated Provider Care Teams.  These Care Teams include your primary Cardiologist (physician) and Advanced Practice Providers (APPs -  Physician Assistants and Nurse Practitioners) who all work together to provide you with the care you need, when you need it.  Your next appointment:   To be  determined  The format for your next appointment:   In Person  Provider:   Loman Brooklyn, MD    Thank you for choosing [facility]!!   Dory Horn, RN [phone]  Other Instructions  Please let us know in the next several weeks what your decision is.   Cardiac Ablation Cardiac ablation is a procedure to destroy (ablate) heart tissue that is sending bad signals. These bad signals cause the heart to beat very fast or in a way that is not normal. Destroying some tissues can help make the heart rhythm normal. Tell your doctor about: Any allergies you have. All medicines you are taking. These include vitamins, herbs, eye drops, creams, and over-the-counter medicines. Any problems you or family members have had with anesthesia. Any bleeding problems you have. Any surgeries  you have had. Any medical conditions you have. Whether you are pregnant or may be pregnant. What are the risks? Your doctor will talk with you about risks. These may include: Infection. Bruising and bleeding. Stroke or blood clots. Damage to nearby areas of your body. Allergies to medicines or dyes. Needing a pacemaker if the heart gets damaged. A pacemaker helps the heart beat normally. The procedure not working. What happens before the procedure? Medicines Ask your doctor about changing or stopping: Your normal medicines. Vitamins, herbs, and supplements. Over-the-counter medicines. Do not take aspirin or ibuprofen unless you are told to. General instructions Follow instructions from your doctor about what you may eat and drink. If you will be going home right after the procedure, plan to have a responsible adult: Take you home from the hospital or clinic. You will not be allowed to drive. Care for you for the time you are told. Ask your doctor what steps will be taken to prevent the spread of germs. What happens during the procedure?  An IV tube will be put into one of your veins. You may be given: A sedative. This helps you relax. Anesthesia. This will: Numb certain areas of your body. The skin on your neck or groin will be numbed. A cut (incision) will be made in your neck or groin. A needle will be put through the cut and into a large vein. The small, thin tube (catheter) will be put into the needle. The tube will be moved to your heart. A type of X-ray (fluoroscopy) will  be used to help guide the tube. It will also show constant images of the heart on a screen. Dye may be put through the tube. This helps your doctor see your heart. An electric current will be sent from the tube to destroy heart tissue in certain areas. The tube will be taken out. Pressure will be held on your cut. This helps stop bleeding. A bandage (dressing) will be put over your cut. The procedure  may vary among doctors and hospitals. What happens after the procedure? You will be monitored until you leave the hospital or clinic. This includes checking your blood pressure, heart rate and rhythm, breathing rate, and blood oxygen level. Your cut will be checked for bleeding. You will need to lie still for a few hours. If your groin was used, you will need to keep your leg straight for a few hours after the small, thin tube is removed. This information is not intended to replace advice given to you by your health care provider. Make sure you discuss any questions you have with your health care provider. Document Revised: 04/18/2022 Document Reviewed: 04/18/2022 Elsevier Patient Education  2024 [employer].

## 2023-07-31 NOTE — Progress Notes (Signed)
Electrophysiology Office Note:   Date:  07/31/2023  ID:  RUSH BRUST, DOB January 21, 1956, MRN [id number]  Primary Cardiologist: Reatha Harps, MD Electrophysiologist: Regan Lemming, MD      History of Present Illness:   Parker Daniel is a 67 y.o. male with h/o atrial fibrillation, hypertension, diabetes seen today for  for Electrophysiology evaluation of atrial fibrillation at the request of [name].    05/11/2023 presented to the hospital with a syncopal episode.  He was found to be hypotensive.  It was thought that this was due to dehydration.  His losartan and metoprolol held and he was given IV fluids.  He then followed up with his primary physician.  He was noted to be in atrial fibrillation.  He is now post cardioversion 06/11/2023.  When he was in atrial fibrillation, he felt tired, fatigued, short of breath.  He feels well now that he is in normal rhythm.  He is on Multaq and feels that he has potentially had intermittent episodes of atrial fibrillation.  Review of systems complete and found to be negative unless listed in HPI.   EP Information / Studies Reviewed:    EKG is not ordered today. EKG from 06/26/23 reviewed which showed sinus rhythm        Risk Assessment/Calculations:    CHA2DS2-VASc Score = 4   This indicates a 4.8% annual risk of stroke. The patient's score is based upon: CHF History: 0 HTN History: 1 Diabetes History: 1 Stroke History: 0 Vascular Disease History: 1 Age Score: 1 Gender Score: 0             Physical Exam:   VS:  BP 134/80 (BP Location: Right Arm, Patient Position: Sitting, Cuff Size: Large)   Pulse 62   Ht 5\' 10"  (1.778 m)   Wt 236 lb 12.8 oz (107.4 kg)   SpO2 98%   BMI 33.98 kg/m    Wt Readings from Last 3 Encounters:  07/31/23 236 lb 12.8 oz (107.4 kg)  06/26/23 239 lb 12.8 oz (108.8 kg)  06/11/23 230 lb (104.3 kg)     GEN: Well nourished, well developed in no acute distress NECK: No JVD; No carotid bruits CARDIAC:  Regular rate and rhythm, no murmurs, rubs, gallops RESPIRATORY:  Clear to auscultation without rales, wheezing or rhonchi  ABDOMEN: Soft, non-tender, non-distended EXTREMITIES:  No edema; No deformity   ASSESSMENT AND PLAN:    1.  Persistent atrial fibrillation: Currently on Multaq.  He is continue to have episodes of atrial fibrillation and is post recent cardioversion.  I did offer alternative medications versus ablation.  He would like to think about this further and Saniah Schroeter call and let us know.  And benefits of ablation were discussed.  He understands the risks and Zenola Dezarn call us back to plan potential ablation.  2.  Nonobstructive coronary artery disease: No current chest pain.  Plan per primary cardiology.  3.  Secondary hypercoagulable state: Currently on Eliquis for atrial fibrillation  Follow up with Dr. Elberta Fortis  pending ablation decision   Signed, Ethan Kasperski Jorja Loa, MD

## 2023-10-16 ENCOUNTER — Encounter (HOSPITAL_COMMUNITY): Payer: Self-pay | Admitting: Family Medicine

## 2023-10-16 ENCOUNTER — Emergency Department (HOSPITAL_COMMUNITY): Payer: Medicare HMO

## 2023-10-16 ENCOUNTER — Other Ambulatory Visit: Payer: Self-pay

## 2023-10-16 ENCOUNTER — Observation Stay (HOSPITAL_COMMUNITY)
Admission: EM | Admit: 2023-10-16 | Discharge: 2023-10-18 | Disposition: A | Discharge status: Home / Self Care | Payer: Medicare HMO | Attending: Family Medicine | Admitting: Family Medicine

## 2023-10-16 DIAGNOSIS — I4821 Permanent atrial fibrillation: Secondary | ICD-10-CM | POA: Insufficient documentation

## 2023-10-16 DIAGNOSIS — Z791 Long term (current) use of non-steroidal anti-inflammatories (NSAID): Secondary | ICD-10-CM | POA: Diagnosis not present

## 2023-10-16 DIAGNOSIS — D62 Acute posthemorrhagic anemia: Secondary | ICD-10-CM | POA: Diagnosis not present

## 2023-10-16 DIAGNOSIS — K922 Gastrointestinal hemorrhage, unspecified: Secondary | ICD-10-CM | POA: Diagnosis not present

## 2023-10-16 DIAGNOSIS — Z7901 Long term (current) use of anticoagulants: Secondary | ICD-10-CM | POA: Diagnosis not present

## 2023-10-16 DIAGNOSIS — T45515A Adverse effect of anticoagulants, initial encounter: Secondary | ICD-10-CM | POA: Insufficient documentation

## 2023-10-16 DIAGNOSIS — I10 Essential (primary) hypertension: Secondary | ICD-10-CM | POA: Diagnosis not present

## 2023-10-16 DIAGNOSIS — G8929 Other chronic pain: Secondary | ICD-10-CM | POA: Insufficient documentation

## 2023-10-16 DIAGNOSIS — E119 Type 2 diabetes mellitus without complications: Secondary | ICD-10-CM | POA: Diagnosis not present

## 2023-10-16 DIAGNOSIS — Z23 Encounter for immunization: Secondary | ICD-10-CM | POA: Insufficient documentation

## 2023-10-16 DIAGNOSIS — T39395A Adverse effect of other nonsteroidal anti-inflammatory drugs [NSAID], initial encounter: Secondary | ICD-10-CM | POA: Insufficient documentation

## 2023-10-16 DIAGNOSIS — I2510 Atherosclerotic heart disease of native coronary artery without angina pectoris: Secondary | ICD-10-CM | POA: Diagnosis not present

## 2023-10-16 DIAGNOSIS — K2970 Gastritis, unspecified, without bleeding: Secondary | ICD-10-CM | POA: Diagnosis not present

## 2023-10-16 DIAGNOSIS — K625 Hemorrhage of anus and rectum: Secondary | ICD-10-CM | POA: Diagnosis present

## 2023-10-16 DIAGNOSIS — Z96651 Presence of right artificial knee joint: Secondary | ICD-10-CM | POA: Insufficient documentation

## 2023-10-16 DIAGNOSIS — K921 Melena: Principal | ICD-10-CM

## 2023-10-16 DIAGNOSIS — Z79899 Other long term (current) drug therapy: Secondary | ICD-10-CM | POA: Insufficient documentation

## 2023-10-16 DIAGNOSIS — F1190 Opioid use, unspecified, uncomplicated: Secondary | ICD-10-CM | POA: Diagnosis present

## 2023-10-16 DIAGNOSIS — K259 Gastric ulcer, unspecified as acute or chronic, without hemorrhage or perforation: Principal | ICD-10-CM | POA: Insufficient documentation

## 2023-10-16 DIAGNOSIS — Z96642 Presence of left artificial hip joint: Secondary | ICD-10-CM | POA: Diagnosis not present

## 2023-10-16 DIAGNOSIS — E871 Hypo-osmolality and hyponatremia: Secondary | ICD-10-CM | POA: Diagnosis present

## 2023-10-16 DIAGNOSIS — I4891 Unspecified atrial fibrillation: Secondary | ICD-10-CM | POA: Diagnosis not present

## 2023-10-16 DIAGNOSIS — N289 Disorder of kidney and ureter, unspecified: Secondary | ICD-10-CM | POA: Insufficient documentation

## 2023-10-16 LAB — CBC
HCT: 44.9 % (ref 39.0–52.0)
Hemoglobin: 14.3 g/dL (ref 13.0–17.0)
MCH: 30.4 pg (ref 26.0–34.0)
MCHC: 31.8 g/dL (ref 30.0–36.0)
MCV: 95.3 fL (ref 80.0–100.0)
Platelets: 203 10*3/uL (ref 150–400)
RBC: 4.71 MIL/uL (ref 4.22–5.81)
RDW: 13.4 % (ref 11.5–15.5)
WBC: 7 10*3/uL (ref 4.0–10.5)
nRBC: 0 % (ref 0.0–0.2)

## 2023-10-16 LAB — COMPREHENSIVE METABOLIC PANEL
ALT: 21 U/L (ref 0–44)
AST: 20 U/L (ref 15–41)
Albumin: 4.4 g/dL (ref 3.5–5.0)
Alkaline Phosphatase: 53 U/L (ref 38–126)
Anion gap: 10 (ref 5–15)
BUN: 19 mg/dL (ref 8–23)
CO2: 21 mmol/L — ABNORMAL LOW (ref 22–32)
Calcium: 8.9 mg/dL (ref 8.9–10.3)
Chloride: 100 mmol/L (ref 98–111)
Creatinine, Ser: 1.15 mg/dL (ref 0.61–1.24)
GFR, Estimated: >60 mL/min (ref >60)
Glucose, Bld: 109 mg/dL — ABNORMAL HIGH (ref 70–99)
Potassium: 3.7 mmol/L (ref 3.5–5.1)
Sodium: 131 mmol/L — ABNORMAL LOW (ref 135–145)
Total Bilirubin: 0.7 mg/dL (ref <1.2)
Total Protein: 7.9 g/dL (ref 6.5–8.1)

## 2023-10-16 LAB — TYPE AND SCREEN
ABO/RH(D): O NEG
Antibody Screen: NEGATIVE

## 2023-10-16 LAB — GLUCOSE, CAPILLARY
Glucose-Capillary: 104 mg/dL — ABNORMAL HIGH (ref 70–99)
Glucose-Capillary: 114 mg/dL — ABNORMAL HIGH (ref 70–99)

## 2023-10-16 LAB — HEMOGLOBIN: Hemoglobin: 12.7 g/dL — ABNORMAL LOW (ref 13.0–17.0)

## 2023-10-16 LAB — HEMATOCRIT: HCT: 38.9 % — ABNORMAL LOW (ref 39.0–52.0)

## 2023-10-16 LAB — ABO/RH: ABO/RH(D): O NEG

## 2023-10-16 LAB — POC OCCULT BLOOD, ED: Fecal Occult Bld: POSITIVE — AB

## 2023-10-16 MED ORDER — ONDANSETRON HCL 4 MG PO TABS: 4.0000 mg | ORAL_TABLET | Freq: Four times a day (QID) | ORAL | Status: DC | PRN

## 2023-10-16 MED ORDER — PANTOPRAZOLE SODIUM 40 MG IV SOLR
40.0000 mg | Freq: Two times a day (BID) | INTRAVENOUS | Status: DC
  Administered 2023-10-16 – 2023-10-17 (×2): 40 mg via INTRAVENOUS
  Filled 2023-10-16 (×2): qty 10

## 2023-10-16 MED ORDER — ONDANSETRON HCL 4 MG/2ML IJ SOLN
4.0000 mg | Freq: Four times a day (QID) | INTRAMUSCULAR | Status: DC | PRN
  Administered 2023-10-17: 4 mg via INTRAVENOUS
  Filled 2023-10-16: qty 2

## 2023-10-16 MED ORDER — ACETAMINOPHEN 325 MG PO TABS: 650.0000 mg | ORAL_TABLET | Freq: Four times a day (QID) | ORAL | Status: DC | PRN

## 2023-10-16 MED ORDER — HYDROMORPHONE HCL 1 MG/ML IJ SOLN
0.5000 mg | INTRAMUSCULAR | Status: DC | PRN
  Administered 2023-10-16: 0.5 mg via INTRAVENOUS
  Filled 2023-10-16: qty 1

## 2023-10-16 MED ORDER — OXYCODONE HCL 5 MG PO TABS
5.0000 mg | ORAL_TABLET | ORAL | Status: DC | PRN
  Administered 2023-10-16: 5 mg via ORAL
  Filled 2023-10-16: qty 1

## 2023-10-16 MED ORDER — METOPROLOL TARTRATE 5 MG/5ML IV SOLN: 2.5000 mg | INTRAVENOUS | Status: DC | PRN

## 2023-10-16 MED ORDER — FENTANYL CITRATE PF 50 MCG/ML IJ SOSY: 50.0000 ug | PREFILLED_SYRINGE | INTRAMUSCULAR | Status: DC | PRN

## 2023-10-16 MED ORDER — TAMSULOSIN HCL 0.4 MG PO CAPS
0.4000 mg | ORAL_CAPSULE | Freq: Every day | ORAL | Status: DC
  Administered 2023-10-16 – 2023-10-17 (×2): 0.4 mg via ORAL
  Filled 2023-10-16 (×2): qty 1

## 2023-10-16 MED ORDER — INFLUENZA VAC A&B SURF ANT ADJ 0.5 ML IM SUSY
0.5000 mL | PREFILLED_SYRINGE | INTRAMUSCULAR | Status: AC
  Administered 2023-10-17: 0.5 mL via INTRAMUSCULAR
  Filled 2023-10-16: qty 0.5

## 2023-10-16 MED ORDER — INSULIN ASPART 100 UNIT/ML IJ SOLN
0.0000 [IU] | INTRAMUSCULAR | Status: DC
  Filled 2023-10-16: qty 0.06

## 2023-10-16 MED ORDER — ACETAMINOPHEN 650 MG RE SUPP: 650.0000 mg | Freq: Four times a day (QID) | RECTAL | Status: DC | PRN

## 2023-10-16 MED ORDER — ONDANSETRON HCL 4 MG/2ML IJ SOLN
4.0000 mg | Freq: Once | INTRAMUSCULAR | Status: AC
  Administered 2023-10-16: 4 mg via INTRAVENOUS
  Filled 2023-10-16: qty 2

## 2023-10-16 MED ORDER — FENTANYL CITRATE PF 50 MCG/ML IJ SOSY
50.0000 ug | PREFILLED_SYRINGE | Freq: Once | INTRAMUSCULAR | Status: AC
  Administered 2023-10-16: 50 ug via INTRAVENOUS
  Filled 2023-10-16: qty 1

## 2023-10-16 MED ORDER — SODIUM CHLORIDE 0.9% FLUSH
3.0000 mL | Freq: Two times a day (BID) | INTRAVENOUS | Status: DC
  Administered 2023-10-16 – 2023-10-18 (×4): 3 mL via INTRAVENOUS

## 2023-10-16 MED ORDER — PNEUMOCOCCAL 20-VAL CONJ VACC 0.5 ML IM SUSY
0.5000 mL | PREFILLED_SYRINGE | INTRAMUSCULAR | Status: AC
  Administered 2023-10-17: 0.5 mL via INTRAMUSCULAR
  Filled 2023-10-16: qty 0.5

## 2023-10-16 MED ORDER — IOHEXOL 350 MG/ML SOLN
100.0000 mL | Freq: Once | INTRAVENOUS | Status: AC | PRN
  Administered 2023-10-16: 100 mL via INTRAVENOUS

## 2023-10-16 MED ORDER — SODIUM CHLORIDE 0.9 % IV BOLUS
1000.0000 mL | Freq: Once | INTRAVENOUS | Status: AC
Start: 2023-10-16 — End: 2023-10-16
  Administered 2023-10-16: 1000 mL via INTRAVENOUS

## 2023-10-16 MED ORDER — PANTOPRAZOLE SODIUM 40 MG IV SOLR
40.0000 mg | Freq: Once | INTRAVENOUS | Status: AC
  Administered 2023-10-16: 40 mg via INTRAVENOUS
  Filled 2023-10-16: qty 10

## 2023-10-16 MED ORDER — ATORVASTATIN CALCIUM 40 MG PO TABS
40.0000 mg | ORAL_TABLET | Freq: Every day | ORAL | Status: DC
  Administered 2023-10-17 – 2023-10-18 (×2): 40 mg via ORAL
  Filled 2023-10-16 (×2): qty 1

## 2023-10-16 MED ORDER — SODIUM CHLORIDE 0.9 % IV SOLN: INTRAVENOUS | Status: DC

## 2023-10-16 MED ORDER — HYDROMORPHONE HCL 1 MG/ML IJ SOLN
0.5000 mg | INTRAMUSCULAR | Status: DC | PRN
  Administered 2023-10-16 – 2023-10-18 (×8): 0.5 mg via INTRAVENOUS
  Filled 2023-10-16 (×8): qty 0.5

## 2023-10-16 MED ORDER — TRAZODONE HCL 50 MG PO TABS
50.0000 mg | ORAL_TABLET | Freq: Every day | ORAL | Status: DC
  Administered 2023-10-16 – 2023-10-17 (×2): 50 mg via ORAL
  Filled 2023-10-16 (×2): qty 1

## 2023-10-16 NOTE — H&P (Addendum)
History and Physical    Parker Daniel:[id number] DOB: 02-Aug-1956 DOA: 10/16/2023  PCP: System, Provider Not In   Patient coming from: Home   Chief Complaint: Abdominal pain, nausea, black tarry stool   HPI: Parker Daniel is a 67 y.o. male with medical history significant for persistent atrial fibrillation on Eliquis, nonobstructive CAD, type 2 diabetes mellitus, and BPH who presents with abdominal pain, nausea, and melena.   Patient developed burning discomfort in the epigastrium couple days ago, and later a dull ache just below the umbilicus.  He noted black stool and a small amount of red blood in the commode last night.  He had additional black stool multiple times today but also reports seeing some red blood.  He has not been vomiting.  He reports exertional dyspnea and lightheadedness on standing today.  Denies chest pain.  ED Course: Upon arrival to the ED, patient is found to be afebrile and saturating well on room air with elevated heart rate and stable blood pressure.  EKG demonstrates atrial fibrillation with rate 121.  CT angiogram is negative for active extravasation or other acute intra-abdominal/pelvic abnormalities.  GI (Dr. Dulce Sellar) was consulted by the ED PA, type and screen was performed, and the patient was treated with 1 L NS, IV Protonix, Zofran, and Dilaudid.  Review of Systems:  All other systems reviewed and apart from HPI, are negative.  Past Medical History:  Diagnosis Date   Arthritis    Coronary artery disease    Diabetes mellitus without complication (HCC)    Dyspnea    with exertion   GERD (gastroesophageal reflux disease)    Gout    History of kidney stones    Hyperlipidemia    Hypertension    Pneumonia    10/2021    Past Surgical History:  Procedure Laterality Date   APPENDECTOMY     age 69   CARDIOVERSION N/A 06/11/2023   Procedure: CARDIOVERSION;  Surgeon: Thurmon Fair, MD;  Location: [facility];  Service: Cardiovascular;   Laterality: N/A;   CHOLECYSTECTOMY     age 16   CYSTOSCOPY/URETEROSCOPY/HOLMIUM LASER/STENT PLACEMENT Right 01/24/2022   Procedure: CYSTOSCOPY/RETROGRADE/URETEROSCOPY/HOLMIUM LASER/STENT PLACEMENT;  Surgeon: Jerilee Field, MD;  Location: [facility];  Service: Urology;  Laterality: Right;   KNEE SURGERY     SURGERY SCROTAL / TESTICULAR     age 4   THULIUM LASER TURP (TRANSURETHRAL RESECTION OF PROSTATE) N/A 01/24/2022   Procedure: THULIUM LASER TURP (TRANSURETHRAL RESECTION OF PROSTATE);  Surgeon: Jerilee Field, MD;  Location: [facility];  Service: Urology;  Laterality: N/A;   TONSILLECTOMY     TOTAL HIP ARTHROPLASTY Left 06/27/2019   Procedure: LEFT TOTAL HIP ARTHROPLASTY ANTERIOR APPROACH;  Surgeon: Kathryne Hitch, MD;  Location: [facility];  Service: Orthopedics;  Laterality: Left;   TOTAL KNEE ARTHROPLASTY Right 03/17/2022   Procedure: RIGHT TOTAL KNEE ARTHROPLASTY;  Surgeon: Kathryne Hitch, MD;  Location: [facility];  Service: Orthopedics;  Laterality: Right;    Social History:   reports that he has never smoked. He has never used smokeless tobacco. He reports current alcohol use. He reports that he does not use drugs.  Allergies  Allergen Reactions   Atorvastatin Other (See Comments)    Other reaction(s): Myalgia Takes pravastatin at home   Rosuvastatin Other (See Comments)    Other reaction(s): Myalgia Takes pravastatin at home    Family History  Problem Relation Age of Onset   Cancer Father  bladder     Prior to Admission medications   Medication Sig Start Date End Date Taking? Authorizing Provider  acetaminophen (TYLENOL) 500 MG tablet Take 1,000 mg by mouth every 8 (eight) hours as needed for moderate pain.    [provider]  allopurinol (ZYLOPRIM) 300 MG tablet Take 300 mg by mouth daily at 6 PM. Pt takes at 6 pm at nite    [provider]  atorvastatin (LIPITOR) 40 MG tablet Take 1 tablet (40 mg total) by mouth daily. 12/08/22  12/03/23  O'NealRonnald Ramp, MD  ELIQUIS 5 MG TABS tablet Take 1 tablet (5 mg total) by mouth 2 (two) times daily. 01/26/22   Jerilee Field, MD  ezetimibe (ZETIA) 10 MG tablet Take 1 tablet by mouth daily. 08/30/22 08/30/23  [provider]  FEROSUL 325 (65 Fe) MG tablet Take 325 mg by mouth every other day. 01/31/23   [provider]  gabapentin (NEURONTIN) 300 MG capsule Take 600 mg by mouth 3 (three) times daily.    [provider]  MULTAQ 400 MG tablet Take 400 mg by mouth 2 (two) times daily. Pt takes at 6pm and 6 am 11/22/21   [provider]  oxyCODONE (ROXICODONE) 15 MG immediate release tablet Take 15 mg by mouth 2 (two) times daily as needed for pain. 12/02/21   [provider]  OZEMPIC, 1 MG/DOSE, 4 MG/3ML SOPN Inject 1 mg into the skin once a week.    [provider]  pregabalin (LYRICA) 75 MG capsule Take 75 mg by mouth 3 (three) times daily.    [provider]  senna (SENOKOT) 8.6 MG tablet Take 2-3 tablets by mouth daily as needed for constipation.    [provider]  tamsulosin (FLOMAX) 0.4 MG CAPS capsule Take 0.4 mg by mouth at bedtime. 9 pm 01/03/22   [provider]  testosterone cypionate (DEPOTESTOSTERONE CYPIONATE) 200 MG/ML injection Inject 200 mg into the muscle every 21 ( twenty-one) days.    [provider]  traZODone (DESYREL) 50 MG tablet Take 50 mg by mouth at bedtime.    [provider]  XTAMPZA ER 18 MG C12A Take 18 mg by mouth every 12 (twelve) hours. Pt takes at 6pm and 6 am 12/31/21   [provider]    Physical Exam: Vitals:   10/16/23 1442 10/16/23 1734 10/16/23 1848 10/16/23 1942  BP:  122/85  136/87  Pulse:  98  81  Resp:  16    Temp:   98.2 F (36.8 C) 98.9 F (37.2 C)  TempSrc:   Oral Oral  SpO2:  100%    Weight: 107 kg       Constitutional: NAD, no pallor or diaphoresis   Eyes: PERTLA, lids and conjunctivae normal ENMT: Mucous membranes  are moist. Posterior pharynx clear of any exudate or lesions.   Neck: supple, no masses  Respiratory: no wheezing, no crackles. No accessory muscle use.  Cardiovascular: S1 & S2 heard, regular rate and rhythm. No extremity edema.   Abdomen: No distension, soft, no guarding. Bowel sounds active.  Musculoskeletal: no clubbing / cyanosis. No joint deformity upper and lower extremities.   Skin: no significant rashes, lesions, ulcers. Warm, dry, well-perfused. Neurologic: CN 2-12 grossly intact. Moving all extremities. Alert and oriented.  Psychiatric: Calm. Cooperative.    Labs and Imaging on Admission: I have personally reviewed following labs and imaging studies  CBC: Recent Labs  Lab 10/16/23 1452  WBC 7.0  HGB 14.3  HCT 44.9  MCV 95.3  PLT 203   Basic Metabolic Panel: Recent Labs  Lab 10/16/23 1452  NA 131*  K 3.7  CL 100  CO2 21*  GLUCOSE 109*  BUN 19  CREATININE 1.15  CALCIUM 8.9   GFR: Estimated Creatinine Clearance: 78.4 mL/min (by C-G formula based on SCr of 1.15 mg/dL). Liver Function Tests: Recent Labs  Lab 10/16/23 1452  AST 20  ALT 21  ALKPHOS 53  BILITOT 0.7  PROT 7.9  ALBUMIN 4.4   No results for input(s): "LIPASE", "AMYLASE" in the last 168 hours. No results for input(s): "AMMONIA" in the last 168 hours. Coagulation Profile: No results for input(s): "INR", "PROTIME" in the last 168 hours. Cardiac Enzymes: No results for input(s): "CKTOTAL", "CKMB", "CKMBINDEX", "TROPONINI" in the last 168 hours. BNP (last 3 results) No results for input(s): "PROBNP" in the last 8760 hours. HbA1C: No results for input(s): "HGBA1C" in the last 72 hours. CBG: No results for input(s): "GLUCAP" in the last 168 hours. Lipid Profile: No results for input(s): "CHOL", "HDL", "LDLCALC", "TRIG", "CHOLHDL", "LDLDIRECT" in the last 72 hours. Thyroid Function Tests: No results for input(s): "TSH", "T4TOTAL", "FREET4", "T3FREE", "THYROIDAB" in the last 72 hours. Anemia  Panel: No results for input(s): "VITAMINB12", "FOLATE", "FERRITIN", "TIBC", "IRON", "RETICCTPCT" in the last 72 hours. Urine analysis:    Component Value Date/Time   COLORURINE YELLOW 05/11/2023 1553   APPEARANCEUR CLEAR 05/11/2023 1553   LABSPEC >1.046 (H) 05/11/2023 1553   PHURINE 5.0 05/11/2023 1553   GLUCOSEU NEGATIVE 05/11/2023 1553   HGBUR NEGATIVE 05/11/2023 1553   BILIRUBINUR NEGATIVE 05/11/2023 1553   KETONESUR NEGATIVE 05/11/2023 1553   PROTEINUR 30 (A) 05/11/2023 1553   NITRITE NEGATIVE 05/11/2023 1553   LEUKOCYTESUR NEGATIVE 05/11/2023 1553   Sepsis Labs: @LABRCNTIP (procalcitonin:4,lacticidven:4) )No results found for this or any previous visit (from the past 240 hour(s)).   Radiological Exams on Admission: CT ANGIO GI BLEED  Result Date: 10/16/2023 CLINICAL DATA:  Epigastric burning sensation 1 day ago with new lower abdominal pain and bloating associated with 2 episodes of melena. EXAM: CTA ABDOMEN AND PELVIS WITHOUT AND WITH CONTRAST TECHNIQUE: Multidetector CT imaging of the abdomen and pelvis was performed using the standard protocol during bolus administration of intravenous contrast. Multiplanar reconstructed images and MIPs were obtained and reviewed to evaluate the vascular anatomy. RADIATION DOSE REDUCTION: This exam was performed according to the departmental dose-optimization program which includes automated exposure control, adjustment of the mA and/or kV according to patient size and/or use of iterative reconstruction technique. CONTRAST:  OMNIPAQUE IOHEXOL 350 MG/ML SOLN COMPARISON:  CTA abdomen and pelvis dated 05/11/2023 FINDINGS: VASCULAR Aorta: Aortic atherosclerosis. Normal caliber aorta without aneurysm, dissection, vasculitis or significant stenosis. Celiac: Patent without evidence of aneurysm, dissection, vasculitis or significant stenosis. SMA: Patent without evidence of aneurysm, dissection, vasculitis or significant stenosis. Renals: Both renal  arteries are patent without evidence of aneurysm, dissection, vasculitis, fibromuscular dysplasia or significant stenosis. IMA: Patent without evidence of aneurysm, dissection, vasculitis or significant stenosis. Inflow: Patent without evidence of aneurysm, dissection, vasculitis or significant stenosis. Proximal Outflow: Bilateral common femoral and visualized portions of the superficial and profunda femoral arteries are patent without evidence of aneurysm, dissection, vasculitis or significant stenosis. Veins: No obvious venous abnormality within the limitations of this arterial phase study. Review of the MIP images confirms the above findings. NON-VASCULAR Lower chest: No focal consolidation or pulmonary nodule in the lung bases. No pleural effusions. Left atrial enlargement. Coronary artery calcifications. Hepatobiliary: No  focal hepatic lesions. No intra or extrahepatic biliary ductal dilation. Cholecystectomy. Pancreas: No focal lesions or main ductal dilation. Spleen: Normal in size without focal abnormality. Adrenals/Urinary Tract: 1.1 cm right adrenal nodule measures -8 HU, likely adenoma. No specific follow-up imaging recommended. No left adrenal nodule. Right upper pole renal cortical scarring. No suspicious renal mass or hydronephrosis. Punctate bilateral nonobstructing stones. Suboptimal evaluation of left bladder wall due to beam hardening artifact from hip arthroplasties. No focal mural thickening of the right bladder. Stomach/Bowel: Normal appearance of the stomach. No evidence of bowel wall thickening, distention, or inflammatory changes. Appendix is not discretely seen. Lymphatic: No enlarged abdominal or pelvic lymph nodes. Reproductive: Prostate is unremarkable. Other: No free fluid, fluid collection, or free air. Musculoskeletal: No acute or abnormal lytic or blastic osseous lesions. Multilevel degenerative changes of the partially imaged thoracic and lumbar spine. Left hip arthroplasty.  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 Surgical changes of the right inguinal region. IMPRESSION: 1. No evidence of active extravasation. 2. No acute abdominopelvic findings. 3. Punctate bilateral nonobstructing stones. 4. Aortic Atherosclerosis (ICD10-I70.0). Coronary artery calcifications. Assessment for potential risk factor modification, dietary therapy or pharmacologic therapy may be warranted, if clinically indicated. Electronically Signed   By: Agustin Cree M.D.   On: 10/16/2023 20:02    EKG: Independently reviewed. Atrial fibrillation, rate 121.   Assessment/Plan   1. GI bleeding  - Reports both black tarry stools and also some red blood; initial Hgb is 14.3, BUN is normal, BP is stable, and no active extravasation on CTA  - Hold Eliquis, continue bowel rest, continue IV PPI, trend H&H, transfuse if needed, follow-up on GI recommendations    2. Atrial fibrillation with RVR  - HR normalized with IVF and analgesia in ED  - Hold Eliquis, continue cardiac monitoring for  now   3. CAD  - No anginal symptoms    4. Hyponatremia  - Serum sodium 131 in setting of hypovolemia  - Hydrate with NS, repeat chem panel in am   5. Type II DM  - A1c was 5.7% in July 2024  - Check CBGs and use low-intensity SSI     DVT prophylaxis: SCDs, Eliquis pta  Code Status: Full  Level of Care: Level of care: Progressive Family Communication: None present   Disposition Plan:  Patient is from: home  Anticipated d/c is to: Home  Anticipated d/c date is: 10/19/23  Patient currently: Pending stable H&H Consults called: GI  Admission status: Inpatient     Briscoe Deutscher, MD [facility]  10/16/2023, 8:59 PM

## 2023-10-16 NOTE — ED Notes (Signed)
Pt in x-ray will get temp on return

## 2023-10-16 NOTE — ED Notes (Signed)
.ED TO INPATIENT HANDOFF REPORT  Name/Age/Gender Parker Daniel 67 y.o. male  Code Status    Code Status Orders  (From admission, onward)           Start     Ordered   10/16/23 2056  Full code  Continuous       Question:  By:  Answer:  Consent: discussion documented in EHR   10/16/23 2058           Code Status History     Date Active Date Inactive Code Status Order ID Comments User Context   05/11/2023 1856 05/12/2023 2200 Full Code [id number]  Buena Irish, MD ED   03/17/2022 1654 03/20/2022 1747 Full Code [id number]  Kathryne Hitch, MD Inpatient   06/27/2019 1111 06/29/2019 1450 Full Code [id number]  Kathryne Hitch, MD Inpatient       Home/SNF/Other Home  Chief Complaint Acute GI bleeding [K92.2]  Level of Care/Admitting Diagnosis ED Disposition     ED Disposition  Admit   Condition  --   Comment  Hospital Area: [facility] [[id number]]  Level of Care: Progressive [102]  Admit to Progressive based on following criteria: GI, ENDOCRINE disease patients with GI bleeding, acute liver failure or pancreatitis, stable with diabetic ketoacidosis or thyrotoxicosis (hypothyroid) state.  May admit patient to [facility] or [facility] if equivalent level of care is available:: Yes  Covid Evaluation: Asymptomatic - no recent exposure (last 10 days) testing not required  Diagnosis: Acute GI bleeding [366440]  Admitting Physician: Briscoe Deutscher [[id number]]  Attending Physician: Briscoe Deutscher [[id number]]  Certification:: I certify this patient will need inpatient services for at least 2 midnights  Expected Medical Readiness: 10/19/2023          Medical History Past Medical History:  Diagnosis Date   Arthritis    Coronary artery disease    Diabetes mellitus without complication (HCC)    Dyspnea    with exertion   GERD (gastroesophageal reflux disease)    Gout    History of kidney stones    Hyperlipidemia    Hypertension     Pneumonia    10/2021    Allergies Allergies  Allergen Reactions   Atorvastatin Other (See Comments)    Other reaction(s): Myalgia Takes pravastatin at home   Rosuvastatin Other (See Comments)    Other reaction(s): Myalgia Takes pravastatin at home    IV Location/Drains/Wounds Patient Lines/Drains/Airways Status     Active Line/Drains/Airways     Name Placement date Placement time Site Days   Peripheral IV 10/16/23 20 G 1" Anterior;Distal;Right;Upper Arm 10/16/23  1526  Arm  less than 1   Peripheral IV 10/16/23 20 G 1" Anterior;Distal;Left;Upper Arm 10/16/23  1552  Arm  less than 1            Labs/Imaging Results for orders placed or performed during the hospital encounter of 10/16/23 (from the past 48 hour(s))  Type and screen [facility]     Status: None   Collection Time: 10/16/23  2:47 PM  Result Value Ref Range   ABO/RH(D) O NEG    Antibody Screen NEG    Sample Expiration      10/19/2023,2359 Performed at [facility], [address], [place], [place] [address]   Comprehensive metabolic panel     Status: Abnormal   Collection Time: 10/16/23  2:52 PM  Result Value Ref Range   Sodium 131 (L) 135 - 145 mmol/L  Potassium 3.7 3.5 - 5.1 mmol/L   Chloride 100 98 - 111 mmol/L   CO2 21 (L) 22 - 32 mmol/L   Glucose, Bld 109 (H) 70 - 99 mg/dL    Comment: Glucose reference range applies only to samples taken after fasting for at least 8 hours.   BUN 19 8 - 23 mg/dL   Creatinine, Ser 1.61 0.61 - 1.24 mg/dL   Calcium 8.9 8.9 - 09.6 mg/dL   Total Protein 7.9 6.5 - 8.1 g/dL   Albumin 4.4 3.5 - 5.0 g/dL   AST 20 15 - 41 U/L   ALT 21 0 - 44 U/L   Alkaline Phosphatase 53 38 - 126 U/L   Total Bilirubin 0.7 <1.2 mg/dL   GFR, Estimated >04 >54 mL/min    Comment: (NOTE) Calculated using the CKD-EPI Creatinine Equation (2021)    Anion gap 10 5 - 15    Comment: Performed at [facility], [address]  [place], [place] [address]  CBC     Status: None   Collection Time: 10/16/23  2:52 PM  Result Value Ref Range   WBC 7.0 4.0 - 10.5 K/uL   RBC 4.71 4.22 - 5.81 MIL/uL   Hemoglobin 14.3 13.0 - 17.0 g/dL   HCT 91.4 78.2 - 95.6 %   MCV 95.3 80.0 - 100.0 fL   MCH 30.4 26.0 - 34.0 pg   MCHC 31.8 30.0 - 36.0 g/dL   RDW 21.3 08.6 - 57.8 %   Platelets 203 150 - 400 K/uL   nRBC 0.0 0.0 - 0.2 %    Comment: Performed at [facility], [address], [place], [place] [address]  ABO/Rh     Status: None   Collection Time: 10/16/23  2:52 PM  Result Value Ref Range   ABO/RH(D)      [place] NEG Performed at [facility], [address] [place], [place] [address]   POC occult blood, ED Provider will collect     Status: Abnormal   Collection Time: 10/16/23  4:31 PM  Result Value Ref Range   Fecal Occult Bld POSITIVE (A) NEGATIVE   CT ANGIO GI BLEED  Result Date: 10/16/2023 CLINICAL DATA:  Epigastric burning sensation 1 day ago with new lower abdominal pain and bloating associated with 2 episodes of melena. EXAM: CTA ABDOMEN AND PELVIS WITHOUT AND WITH CONTRAST TECHNIQUE: Multidetector CT imaging of the abdomen and pelvis was performed using the standard protocol during bolus administration of intravenous contrast. Multiplanar reconstructed images and MIPs were obtained and reviewed to evaluate the vascular anatomy. RADIATION DOSE REDUCTION: This exam was performed according to the departmental dose-optimization program which includes automated exposure control, adjustment of the mA and/or kV according to patient size and/or use of iterative reconstruction technique. CONTRAST:  OMNIPAQUE IOHEXOL 350 MG/ML SOLN COMPARISON:  CTA abdomen and pelvis dated 05/11/2023 FINDINGS: VASCULAR Aorta: Aortic atherosclerosis. Normal caliber aorta without aneurysm, dissection, vasculitis or significant stenosis. Celiac: Patent without evidence of aneurysm, dissection, vasculitis or  significant stenosis. SMA: Patent without evidence of aneurysm, dissection, vasculitis or significant stenosis. Renals: Both renal arteries are patent without evidence of aneurysm, dissection, vasculitis, fibromuscular dysplasia or significant stenosis. IMA: Patent without evidence of aneurysm, dissection, vasculitis or significant stenosis. Inflow: Patent without evidence of aneurysm, dissection, vasculitis or significant stenosis. Proximal Outflow: Bilateral common femoral and visualized portions of the superficial and profunda femoral arteries are patent without evidence of aneurysm, dissection, vasculitis or significant stenosis. Veins:  No obvious venous abnormality within the limitations of this arterial phase study. Review of the MIP images confirms the above findings. NON-VASCULAR Lower chest: No focal consolidation or pulmonary nodule in the lung bases. No pleural effusions. Left atrial enlargement. Coronary artery calcifications. Hepatobiliary: No focal hepatic lesions. No intra or extrahepatic biliary ductal dilation. Cholecystectomy. Pancreas: No focal lesions or main ductal dilation. Spleen: Normal in size without focal abnormality. Adrenals/Urinary Tract: 1.1 cm right adrenal nodule measures -8 HU, likely adenoma. No specific follow-up imaging recommended. No left adrenal nodule. Right upper pole renal cortical scarring. No suspicious renal mass or hydronephrosis. Punctate bilateral nonobstructing stones. Suboptimal evaluation of left bladder wall due to beam hardening artifact from hip arthroplasties. No focal mural thickening of the right bladder. Stomach/Bowel: Normal appearance of the stomach. No evidence of bowel wall thickening, distention, or inflammatory changes. Appendix is not discretely seen. Lymphatic: No enlarged abdominal or pelvic lymph nodes. Reproductive: Prostate is unremarkable. Other: No free fluid, fluid collection, or free air. Musculoskeletal: No acute or abnormal lytic or blastic  osseous lesions. Multilevel degenerative changes of the partially imaged thoracic and lumbar spine. Left hip arthroplasty. 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 Surgical changes of the right inguinal region. IMPRESSION: 1. No evidence of active extravasation. 2. No acute abdominopelvic findings. 3. Punctate bilateral nonobstructing stones. 4. Aortic Atherosclerosis (ICD10-I70.0). Coronary artery calcifications. Assessment for potential risk factor modification, dietary therapy or pharmacologic therapy may be warranted, if clinically indicated. Electronically Signed   By: Agustin Cree M.D.   On: 10/16/2023 20:02    Pending Labs Unresulted Labs (From admission, onward)     Start     Ordered   10/17/23 0500  Hemoglobin A1c  Tomorrow morning,   R       Comments: To assess prior glycemic control    10/16/23 2058   10/17/23 0500  Basic metabolic panel  Daily,   R      10/16/23 2058   10/17/23 0500  Magnesium  Tomorrow morning,   R        10/16/23 2058   10/16/23 2057  Hemoglobin  Now then every 6 hours,   R (with TIMED occurrences)      10/16/23 2058   10/16/23 2057  Hematocrit  Now then every 6 hours,   R (with TIMED occurrences)      10/16/23 2058            Vitals/Pain Today's Vitals   10/16/23 1734 10/16/23 1744 10/16/23 1848 10/16/23 1942  BP: 122/85   136/87  Pulse: 98   81  Resp: 16     Temp:   98.2 F (36.8 C) 98.9 F (37.2 C)  TempSrc:   Oral Oral  SpO2: 100%     Weight:      PainSc:  5   8     Isolation Precautions No active isolations  Medications Medications  HYDROmorphone (DILAUDID) injection 0.5 mg (has no administration in time range)  tamsulosin (FLOMAX) capsule 0.4 mg (has no administration in time range)  atorvastatin (LIPITOR) tablet 40 mg (has no administration in time range)  traZODone (DESYREL) tablet 50 mg (has no administration in time range)  insulin aspart (novoLOG) injection 0-6 Units (has no administration  in time range)  sodium chloride flush (NS) 0.9 % injection 3 mL (has no administration in time range)  acetaminophen (TYLENOL) tablet 650 mg (has no administration in time range)    Or  acetaminophen (TYLENOL) suppository  650 mg (has no administration in time range)  oxyCODONE (Oxy IR/ROXICODONE) immediate release tablet 5 mg (has no administration in time range)  ondansetron (ZOFRAN) tablet 4 mg (has no administration in time range)    Or  ondansetron (ZOFRAN) injection 4 mg (has no administration in time range)  pantoprazole (PROTONIX) injection 40 mg (has no administration in time range)  metoprolol tartrate (LOPRESSOR) injection 2.5 mg (has no administration in time range)  pantoprazole (PROTONIX) injection 40 mg (40 mg Intravenous Given 10/16/23 1602)  fentaNYL (SUBLIMAZE) injection 50 mcg (50 mcg Intravenous Given 10/16/23 1658)  ondansetron (ZOFRAN) injection 4 mg (4 mg Intravenous Given 10/16/23 1657)  sodium chloride 0.9 % bolus 1,000 mL (0 mLs Intravenous Stopped 10/16/23 1917)  iohexol (OMNIPAQUE) 350 MG/ML injection 100 mL (100 mLs Intravenous Contrast Given 10/16/23 1827)    Mobility walks with person assist

## 2023-10-16 NOTE — ED Triage Notes (Addendum)
C/o burning sensation to upper abd yesterday. Pt reports when waking up this am had lower abd pain with bloating and 2 episodes of tarry black stools with nausea.  Pt is currently on eliquis.  Hx of a-fib. Sob with activity.

## 2023-10-16 NOTE — ED Notes (Signed)
Floor nurse aware pt is coming up

## 2023-10-16 NOTE — ED Provider Notes (Signed)
[facility] EMERGENCY DEPARTMENT AT [facility] Provider Note   CSN: [id number] Arrival date & time: 10/16/23  1431     History  Chief Complaint  Patient presents with   Rectal Bleeding    Parker Daniel is a 67 y.o. male who presents emergency department the chief complaint of abdominal pain and melena.  He is currently on Eliquis for paroxysmal atrial fibrillation.  Patient reports having burning epigastric abdominal pain yesterday.  Today began having severe pain in his lower abdomen along with urgency to urinate.  He has had approximately 6 or 7 tarry black and maroon-colored bowel movements since yesterday.  He has associated sensation of near syncope and weakness.  He has never had anything like this before.   Rectal Bleeding      Home Medications Prior to Admission medications   Medication Sig Start Date End Date Taking? Authorizing Provider  acetaminophen (TYLENOL) 500 MG tablet Take 1,000 mg by mouth every 8 (eight) hours as needed for moderate pain.    [provider]  allopurinol (ZYLOPRIM) 300 MG tablet Take 300 mg by mouth daily at 6 PM. Pt takes at 6 pm at nite    [provider]  atorvastatin (LIPITOR) 40 MG tablet Take 1 tablet (40 mg total) by mouth daily. 12/08/22 12/03/23  O'NealRonnald Ramp, MD  ELIQUIS 5 MG TABS tablet Take 1 tablet (5 mg total) by mouth 2 (two) times daily. 01/26/22   Jerilee Field, MD  ezetimibe (ZETIA) 10 MG tablet Take 1 tablet by mouth daily. 08/30/22 08/30/23  [provider]  FEROSUL 325 (65 Fe) MG tablet Take 325 mg by mouth every other day. 01/31/23   [provider]  gabapentin (NEURONTIN) 300 MG capsule Take 600 mg by mouth 3 (three) times daily.    [provider]  MULTAQ 400 MG tablet Take 400 mg by mouth 2 (two) times daily. Pt takes at 6pm and 6 am 11/22/21   [provider]  oxyCODONE (ROXICODONE) 15 MG immediate release tablet Take 15 mg by mouth 2 (two) times daily  as needed for pain. 12/02/21   [provider]  OZEMPIC, 1 MG/DOSE, 4 MG/3ML SOPN Inject 1 mg into the skin once a week.    [provider]  pregabalin (LYRICA) 75 MG capsule Take 75 mg by mouth 3 (three) times daily.    [provider]  senna (SENOKOT) 8.6 MG tablet Take 2-3 tablets by mouth daily as needed for constipation.    [provider]  tamsulosin (FLOMAX) 0.4 MG CAPS capsule Take 0.4 mg by mouth at bedtime. 9 pm 01/03/22   [provider]  testosterone cypionate (DEPOTESTOSTERONE CYPIONATE) 200 MG/ML injection Inject 200 mg into the muscle every 21 ( twenty-one) days.    [provider]  traZODone (DESYREL) 50 MG tablet Take 50 mg by mouth at bedtime.    [provider]  XTAMPZA ER 18 MG C12A Take 18 mg by mouth every 12 (twelve) hours. Pt takes at 6pm and 6 am 12/31/21   [provider]      Allergies    Atorvastatin and Rosuvastatin    Review of Systems   Review of Systems  Gastrointestinal:  Positive for hematochezia.    Physical Exam Updated Vital Signs BP 122/85   Pulse 98   Temp 97.7 F (36.5 C) (Oral)   Resp 16   Wt 107 kg   SpO2 100%   BMI 33.85 kg/m  Physical  Exam Vitals and nursing note reviewed.  Constitutional:      General: He is not in acute distress.    Appearance: He is well-developed. He is not diaphoretic.  HENT:     Head: Normocephalic and atraumatic.  Eyes:     General: No scleral icterus.    Conjunctiva/sclera: Conjunctivae normal.  Cardiovascular:     Rate and Rhythm: Normal rate and regular rhythm.     Heart sounds: Normal heart sounds.  Pulmonary:     Effort: Pulmonary effort is normal. No respiratory distress.     Breath sounds: Normal breath sounds.  Abdominal:     Palpations: Abdomen is soft.     Tenderness: There is no abdominal tenderness.  Genitourinary:    Comments: Digital Rectal Exam reveals sphincter with good tone. No external hemorrhoids. No masses or  fissures. Melena on exam  Musculoskeletal:     Cervical back: Normal range of motion and neck supple.  Skin:    General: Skin is warm and dry.  Neurological:     Mental Status: He is alert.  Psychiatric:        Behavior: Behavior normal.     ED Results / Procedures / Treatments   Labs (all labs ordered are listed, but only abnormal results are displayed) Labs Reviewed  COMPREHENSIVE METABOLIC PANEL - Abnormal; Notable for the following components:      Result Value   Sodium 131 (*)    CO2 21 (*)    Glucose, Bld 109 (*)    All other components within normal limits  POC OCCULT BLOOD, ED - Abnormal; Notable for the following components:   Fecal Occult Bld POSITIVE (*)    All other components within normal limits  CBC  TYPE AND SCREEN  ABO/RH    EKG None  Radiology No results found.  Procedures Procedures    Medications Ordered in ED Medications  iohexol (OMNIPAQUE) 350 MG/ML injection 100 mL (has no administration in time range)  pantoprazole (PROTONIX) injection 40 mg (40 mg Intravenous Given 10/16/23 1602)  fentaNYL (SUBLIMAZE) injection 50 mcg (50 mcg Intravenous Given 10/16/23 1658)  ondansetron (ZOFRAN) injection 4 mg (4 mg Intravenous Given 10/16/23 1657)  sodium chloride 0.9 % bolus 1,000 mL (1,000 mLs Intravenous New Bag/Given 10/16/23 1700)    ED Course/ Medical Decision Making/ A&P                                 Medical Decision Making Patient presents to the ED with a cc absominal pain and melena. The differential diagnosis for generalized abdominal pain includes, but is not limited to AAA, gastroenteritis, appendicitis, Bowel obstruction, Bowel perforation. Gastroparesis, DKA, Hernia, Inflammatory bowel disease, mesenteric ischemia, pancreatitis, peritonitis SBP, volvulus.   Patient with overt melena on eliquis for afib He is symptomatic but HDS at this time.   Patient will need inpatient work up for GI bleed.   Amount and/or Complexity of Data  Reviewed Labs: ordered.    Details: Labs reviewed hgb stable. Cmp with glu elevated at 109 likely acute phase reaction. Radiology: ordered and independent interpretation performed.    Details: I personally visualized and interpreted the images using our PACS system. Acute findings include:  No acute findings   Atherosclerosis noted  ECG/medicine tests: ordered and independent interpretation performed.    Details: Afib w/ rvr rate of 121 Currently rate controlled on cardiac monitor  Risk Prescription drug management. Decision regarding hospitalization.  Case discussed with Dr. Antionette Char for admission. Secure message sent to Dr. Dulce Sellar for GI consult        Final Clinical Impression(s) / ED Diagnoses Final diagnoses:  None    Rx / DC Orders ED Discharge Orders     None         Arthor Captain, PA-C 10/20/23 1041    Charlynne Pander, MD 10/21/23 [phone]

## 2023-10-17 ENCOUNTER — Inpatient Hospital Stay (HOSPITAL_COMMUNITY): Payer: Medicare HMO | Admitting: Anesthesiology

## 2023-10-17 ENCOUNTER — Encounter (HOSPITAL_COMMUNITY): Payer: Self-pay | Admitting: Family Medicine

## 2023-10-17 ENCOUNTER — Encounter (HOSPITAL_COMMUNITY)
Admission: EM | Disposition: A | Discharge status: Home / Self Care | Payer: Self-pay | Source: Home / Self Care | Attending: Emergency Medicine

## 2023-10-17 DIAGNOSIS — K922 Gastrointestinal hemorrhage, unspecified: Secondary | ICD-10-CM | POA: Diagnosis not present

## 2023-10-17 DIAGNOSIS — K259 Gastric ulcer, unspecified as acute or chronic, without hemorrhage or perforation: Secondary | ICD-10-CM | POA: Diagnosis not present

## 2023-10-17 HISTORY — PX: ESOPHAGOGASTRODUODENOSCOPY (EGD) WITH PROPOFOL: SHX5813

## 2023-10-17 HISTORY — PX: BIOPSY: SHX5522

## 2023-10-17 LAB — BASIC METABOLIC PANEL
Anion gap: 8 (ref 5–15)
BUN: 14 mg/dL (ref 8–23)
CO2: 21 mmol/L — ABNORMAL LOW (ref 22–32)
Calcium: 8.3 mg/dL — ABNORMAL LOW (ref 8.9–10.3)
Chloride: 107 mmol/L (ref 98–111)
Creatinine, Ser: 1.16 mg/dL (ref 0.61–1.24)
GFR, Estimated: >60 mL/min (ref >60)
Glucose, Bld: 86 mg/dL (ref 70–99)
Potassium: 4 mmol/L (ref 3.5–5.1)
Sodium: 136 mmol/L (ref 135–145)

## 2023-10-17 LAB — MAGNESIUM: Magnesium: 2 mg/dL (ref 1.7–2.4)

## 2023-10-17 LAB — HEMATOCRIT
HCT: 36.9 % — ABNORMAL LOW (ref 39.0–52.0)
HCT: 37.5 % — ABNORMAL LOW (ref 39.0–52.0)
HCT: 41.5 % (ref 39.0–52.0)
HCT: 39.3 % (ref 39.0–52.0)

## 2023-10-17 LAB — GLUCOSE, CAPILLARY
Glucose-Capillary: 87 mg/dL (ref 70–99)
Glucose-Capillary: 88 mg/dL (ref 70–99)
Glucose-Capillary: 83 mg/dL (ref 70–99)

## 2023-10-17 LAB — HEMOGLOBIN A1C
Hgb A1c MFr Bld: 5.1 % (ref 4.8–5.6)
Mean Plasma Glucose: 99.67 mg/dL

## 2023-10-17 LAB — HEMOGLOBIN
Hemoglobin: 12 g/dL — ABNORMAL LOW (ref 13.0–17.0)
Hemoglobin: 12 g/dL — ABNORMAL LOW (ref 13.0–17.0)
Hemoglobin: 13 g/dL (ref 13.0–17.0)
Hemoglobin: 12.6 g/dL — ABNORMAL LOW (ref 13.0–17.0)

## 2023-10-17 SURGERY — ESOPHAGOGASTRODUODENOSCOPY (EGD) WITH PROPOFOL
Anesthesia: Monitor Anesthesia Care | Laterality: Left

## 2023-10-17 MED ORDER — PHENYLEPHRINE HCL (PRESSORS) 10 MG/ML IV SOLN
INTRAVENOUS | Status: DC | PRN
  Administered 2023-10-17: 200 ug via INTRAVENOUS
  Administered 2023-10-17: 100 ug via INTRAVENOUS

## 2023-10-17 MED ORDER — PROPOFOL 1000 MG/100ML IV EMUL
INTRAVENOUS | Status: AC
  Filled 2023-10-17: qty 200

## 2023-10-17 MED ORDER — SENNA 8.6 MG PO TABS: 2.0000 | ORAL_TABLET | Freq: Every day | ORAL | Status: DC | PRN

## 2023-10-17 MED ORDER — PREGABALIN 75 MG PO CAPS
75.0000 mg | ORAL_CAPSULE | Freq: Three times a day (TID) | ORAL | Status: DC
  Administered 2023-10-17 – 2023-10-18 (×3): 75 mg via ORAL
  Filled 2023-10-17 (×3): qty 1

## 2023-10-17 MED ORDER — PROPOFOL 500 MG/50ML IV EMUL
INTRAVENOUS | Status: DC | PRN
  Administered 2023-10-17: 100 ug/kg/min via INTRAVENOUS

## 2023-10-17 MED ORDER — DRONEDARONE HCL 400 MG PO TABS
400.0000 mg | ORAL_TABLET | Freq: Two times a day (BID) | ORAL | Status: DC
  Administered 2023-10-17 – 2023-10-18 (×3): 400 mg via ORAL
  Filled 2023-10-17 (×3): qty 1

## 2023-10-17 MED ORDER — ACETAMINOPHEN 500 MG PO TABS: 1000.0000 mg | ORAL_TABLET | Freq: Three times a day (TID) | ORAL | Status: DC | PRN

## 2023-10-17 MED ORDER — ORAL CARE MOUTH RINSE: 15.0000 mL | OROMUCOSAL | Status: DC | PRN

## 2023-10-17 MED ORDER — PANTOPRAZOLE SODIUM 40 MG PO TBEC
40.0000 mg | DELAYED_RELEASE_TABLET | Freq: Two times a day (BID) | ORAL | Status: DC
  Administered 2023-10-17 – 2023-10-18 (×2): 40 mg via ORAL
  Filled 2023-10-17 (×3): qty 1

## 2023-10-17 MED ORDER — OXYCODONE HCL 5 MG PO TABS
5.0000 mg | ORAL_TABLET | ORAL | Status: DC | PRN
  Administered 2023-10-17: 5 mg via ORAL
  Filled 2023-10-17: qty 1

## 2023-10-17 MED ORDER — SODIUM CHLORIDE 0.9 % IV SOLN: INTRAVENOUS | Status: DC | PRN

## 2023-10-17 MED ORDER — OXYCODONE HCL 5 MG PO TABS: 15.0000 mg | ORAL_TABLET | Freq: Two times a day (BID) | ORAL | Status: DC | PRN

## 2023-10-17 MED ORDER — OXYCODONE HCL ER 20 MG PO T12A: 20.0000 mg | EXTENDED_RELEASE_TABLET | Freq: Two times a day (BID) | ORAL | Status: DC

## 2023-10-17 MED ORDER — DEXAMETHASONE SODIUM PHOSPHATE 10 MG/ML IJ SOLN
INTRAMUSCULAR | Status: DC | PRN
  Administered 2023-10-17: 10 mg via INTRAVENOUS

## 2023-10-17 SURGICAL SUPPLY — 14 items

## 2023-10-17 NOTE — Transfer of Care (Signed)
Immediate Anesthesia Transfer of Care Note  Patient: JAG OBENHAUS  Procedure(s) Performed: ESOPHAGOGASTRODUODENOSCOPY (EGD) WITH PROPOFOL (Left) BIOPSY  Patient Location: PACU and Endo recovery  Anesthesia Type:MAC  Level of Consciousness: awake  Airway & Oxygen Therapy: Patient Spontanous Breathing and Patient connected to face mask oxygen  Post-op Assessment: Report given to RN and Post -op Vital signs reviewed and stable  Post vital signs: stable  Last Vitals:  Vitals Value Taken Time  BP 119/78 10/17/23 1303  Temp 36.4 C 10/17/23 1302  Pulse 84 10/17/23 1307  Resp 16 10/17/23 1307  SpO2 95 % 10/17/23 1307  Vitals shown include unfiled device data.  Last Pain:  Vitals:   10/17/23 1302  TempSrc: Temporal  PainSc: 8       Patients Stated Pain Goal: 3 (10/17/23 1302)  Complications: No notable events documented.

## 2023-10-17 NOTE — Interval H&P Note (Signed)
History and Physical Interval Note:  10/17/2023 12:11 PM  Parker Daniel  has presented today for surgery, with the diagnosis of melena, anemia.  The various methods of treatment have been discussed with the patient and family. After consideration of risks, benefits and other options for treatment, the patient has consented to  Procedure(s): ESOPHAGOGASTRODUODENOSCOPY (EGD) WITH PROPOFOL (Left) as a surgical intervention.  The patient's history has been reviewed, patient examined, no change in status, stable for surgery.  I have reviewed the patient's chart and labs.  Questions were answered to the patient's satisfaction.     Freddy Jaksch

## 2023-10-17 NOTE — Consult Note (Signed)
[facility] Consultation Note  Referring Provider: [facility] Primary Care Physician:  System, Provider Not In Primary Gastroenterologist:  Unassigned  Reason for Consultation:  GI bleeding  HPI: Parker Daniel is a 67 y.o. male admitted GI bleeding.  MMP as below.  On eliquis, last dose 2 days ago.  2 days of hematochezia and melena.  Hgb 12.  CTA negative bleeding source.  Denies prior GI bleeding, but does report history of gastric ulcer few years ago.  Takes NSAIDs regularly for back pain.  Reports having had endoscopy and colonoscopy about one year ago in [place] (results not known).  No hematemesis.   Past Medical History:  Diagnosis Date   Arthritis    Coronary artery disease    Diabetes mellitus without complication (HCC)    Dyspnea    with exertion   GERD (gastroesophageal reflux disease)    Gout    History of kidney stones    Hyperlipidemia    Hypertension    Pneumonia    10/2021    Past Surgical History:  Procedure Laterality Date   APPENDECTOMY     age 49   CARDIOVERSION N/A 06/11/2023   Procedure: CARDIOVERSION;  Surgeon: Thurmon Fair, MD;  Location: [facility] INVASIVE CV LAB;  Service: Cardiovascular;  Laterality: N/A;   CHOLECYSTECTOMY     age 88   CYSTOSCOPY/URETEROSCOPY/HOLMIUM LASER/STENT PLACEMENT Right 01/24/2022   Procedure: CYSTOSCOPY/RETROGRADE/URETEROSCOPY/HOLMIUM LASER/STENT PLACEMENT;  Surgeon: Jerilee Field, MD;  Location: [facility] ORS;  Service: Urology;  Laterality: Right;   KNEE SURGERY     SURGERY SCROTAL / TESTICULAR     age 83   THULIUM LASER TURP (TRANSURETHRAL RESECTION OF PROSTATE) N/A 01/24/2022   Procedure: THULIUM LASER TURP (TRANSURETHRAL RESECTION OF PROSTATE);  Surgeon: Jerilee Field, MD;  Location: [facility] ORS;  Service: Urology;  Laterality: N/A;   TONSILLECTOMY     TOTAL HIP ARTHROPLASTY Left 06/27/2019   Procedure: LEFT TOTAL HIP ARTHROPLASTY ANTERIOR APPROACH;  Surgeon: Kathryne Hitch, MD;  Location: [facility]  ORS;  Service: Orthopedics;  Laterality: Left;   TOTAL KNEE ARTHROPLASTY Right 03/17/2022   Procedure: RIGHT TOTAL KNEE ARTHROPLASTY;  Surgeon: Kathryne Hitch, MD;  Location: [facility] ORS;  Service: Orthopedics;  Laterality: Right;    Prior to Admission medications   Medication Sig Start Date End Date Taking? Authorizing Provider  acetaminophen (TYLENOL) 500 MG tablet Take 1,000 mg by mouth every 8 (eight) hours as needed for moderate pain.   Yes [provider]  allopurinol (ZYLOPRIM) 300 MG tablet Take 300 mg by mouth daily at 6 PM. Pt takes at 6 pm at nite   Yes [provider]  atorvastatin (LIPITOR) 40 MG tablet Take 1 tablet (40 mg total) by mouth daily. 12/08/22 12/03/23 Yes O'Neal, Ronnald Ramp, MD  ezetimibe (ZETIA) 10 MG tablet Take 1 tablet by mouth daily. 08/30/22 10/17/23 Yes [provider]  FEROSUL 325 (65 Fe) MG tablet Take 325 mg by mouth every other day. 01/31/23  Yes [provider]  MULTAQ 400 MG tablet Take 400 mg by mouth 2 (two) times daily. Pt takes at 6pm and 6 am 11/22/21  Yes [provider]  pregabalin (LYRICA) 75 MG capsule Take 75 mg by mouth 3 (three) times daily.   Yes [provider]  senna (SENOKOT) 8.6 MG tablet Take 2-3 tablets by mouth daily as needed for constipation.   Yes [provider]  tamsulosin (FLOMAX) 0.4 MG CAPS capsule Take 0.4 mg by mouth at bedtime. 9 pm 01/03/22  Yes [provider]  testosterone cypionate (DEPOTESTOSTERONE CYPIONATE) 200 MG/ML injection Inject 200 mg into the muscle every 21 ( twenty-one) days.   Yes [provider]  traZODone (DESYREL) 50 MG tablet Take 50 mg by mouth at bedtime.   Yes [provider]  ELIQUIS 5 MG TABS tablet Take 1 tablet (5 mg total) by mouth 2 (two) times daily. Patient not taking: Reported on 10/17/2023 01/26/22   Jerilee Field, MD  oxyCODONE (ROXICODONE) 15 MG immediate release tablet Take 15 mg by mouth 2 (two) times  daily as needed for pain. Patient not taking: Reported on 10/17/2023 12/02/21   [provider]  OZEMPIC, 1 MG/DOSE, 4 MG/3ML SOPN Inject 1 mg into the skin once a week. Patient not taking: Reported on 10/17/2023    [provider]  XTAMPZA ER 18 MG C12A Take 18 mg by mouth every 12 (twelve) hours. Pt takes at 6pm and 6 am Patient not taking: Reported on 10/17/2023 12/31/21   [provider]    Current Facility-Administered Medications  Medication Dose Route Frequency Provider Last Rate Last Admin   acetaminophen (TYLENOL) tablet 650 mg  650 mg Oral Q6H PRN Opyd, Lavone Neri, MD       Or   acetaminophen (TYLENOL) suppository 650 mg  650 mg Rectal Q6H PRN Opyd, Lavone Neri, MD       atorvastatin (LIPITOR) tablet 40 mg  40 mg Oral Daily Opyd, Lavone Neri, MD   40 mg at 10/17/23 6237   dronedarone (MULTAQ) tablet 400 mg  400 mg Oral BID Rhetta Mura, MD   400 mg at 10/17/23 1124   HYDROmorphone (DILAUDID) injection 0.5 mg  0.5 mg Intravenous Q4H PRN Opyd, Lavone Neri, MD   0.5 mg at 10/17/23 6283   influenza vaccine adjuvanted (FLUAD) injection 0.5 mL  0.5 mL Intramuscular Tomorrow-1000 Opyd, Lavone Neri, MD       insulin aspart (novoLOG) injection 0-6 Units  0-6 Units Subcutaneous Q4H Opyd, Lavone Neri, MD       metoprolol tartrate (LOPRESSOR) injection 2.5 mg  2.5 mg Intravenous Q2H PRN Opyd, Lavone Neri, MD       ondansetron (ZOFRAN) tablet 4 mg  4 mg Oral Q6H PRN Opyd, Lavone Neri, MD       Or   ondansetron (ZOFRAN) injection 4 mg  4 mg Intravenous Q6H PRN Opyd, Lavone Neri, MD       oxyCODONE (Oxy IR/ROXICODONE) immediate release tablet 15 mg  15 mg Oral BID PRN Rhetta Mura, MD       oxyCODONE (OXYCONTIN) 12 hr tablet 20 mg  20 mg Oral Q12H Rhetta Mura, MD       pantoprazole (PROTONIX) injection 40 mg  40 mg Intravenous Q12H Opyd, Lavone Neri, MD   40 mg at 10/17/23 1517   pneumococcal 20-valent conjugate vaccine (PREVNAR 20) injection 0.5 mL  0.5 mL  Intramuscular Tomorrow-1000 Opyd, Lavone Neri, MD       senna (SENOKOT) tablet 17.2 mg  2 tablet Oral Daily PRN Rhetta Mura, MD       sodium chloride flush (NS) 0.9 % injection 3 mL  3 mL Intravenous Q12H Opyd, Lavone Neri, MD   3 mL at 10/17/23 0809   tamsulosin (FLOMAX) capsule 0.4 mg  0.4 mg Oral QHS Opyd, Lavone Neri, MD   0.4 mg at 10/16/23 2115   traZODone (DESYREL) tablet 50 mg  50 mg Oral QHS Briscoe Deutscher, MD   50 mg at 10/16/23 2115  Allergies as of 10/16/2023 - Review Complete 10/16/2023  Allergen Reaction Noted   Atorvastatin Other (See Comments) 12/02/2010   Rosuvastatin Other (See Comments) 12/02/2010    Family History  Problem Relation Age of Onset   Cancer Father        bladder    Social History   Socioeconomic History   Marital status: Married    Spouse name: Not on file   Number of children: 1   Years of education: Not on file   Highest education level: Not on file  Occupational History   Not on file  Tobacco Use   Smoking status: Never   Smokeless tobacco: Never  Vaping Use   Vaping status: Never Used  Substance and Sexual Activity   Alcohol use: Yes    Comment: rare   Drug use: Never   Sexual activity: Yes    Birth control/protection: None  Other Topics Concern   Not on file  Social History Narrative   Not on file   Social Determinants of Health   Financial Resource Strain: Medium Risk (11/28/2022)   Received from [facility], [facility], [facility], [facility], [facility]   Overall Financial Resource Strain (CARDIA)    Difficulty of Paying Living Expenses: Somewhat hard  Food Insecurity: No Food Insecurity (10/16/2023)   Hunger Vital Sign    Worried About Running Out of Food in the Last Year: Never true    Ran Out of Food in the Last Year: Never true  Transportation Needs: No Transportation Needs (10/16/2023)   PRAPARE - Administrator, Civil Service (Medical): No     Lack of Transportation (Non-Medical): No  Physical Activity: Not on file  Stress: Not on file  Social Connections: Unknown (01/09/2023)   Received from [facility], [facility]   Social Network    Social Network: Not on file  Recent Concern: Social Connections - Moderately Isolated (11/28/2022)   Received from [facility], [facility], [facility], [facility]   Social Connection and Isolation Panel [NHANES]    Frequency of Communication with Friends and Family: More than three times a week    Frequency of Social Gatherings with Friends and Family: Twice a week    Attends Religious Services: Never    Database administrator or Organizations: No    Attends Banker Meetings: Never    Marital Status: Married  Catering manager Violence: Not At Risk (10/16/2023)   Humiliation, Afraid, Rape, and Kick questionnaire    Fear of Current or Ex-Partner: No    Emotionally Abused: No    Physically Abused: No    Sexually Abused: No    Review of Systems: As per HPI, all others negative  Physical Exam: Vital signs in last 24 hours: Temp:  [97.7 F (36.5 C)-99 F (37.2 C)] 98.2 F (36.8 C) (12/04 0931) Pulse Rate:  [78-105] 78 (12/04 0931) Resp:  [16-20] 20 (12/04 0931) BP: (104-146)/(70-91) 104/70 (12/04 0931) SpO2:  [95 %-100 %] 95 % (12/04 0931) Weight:  [100.3 kg-107 kg] 100.3 kg (12/04 0638) Last BM Date : 10/16/23 General:   Alert,  Well-developed, well-nourished, pleasant and cooperative in NAD Head:  Normocephalic and atraumatic. Eyes:  Sclera clear, no icterus.   Conjunctiva pink. Ears:  Normal auditory acuity. Nose:  No deformity, discharge,  or lesions. Mouth:  No deformity or lesions.  Oropharynx pink & moist. Neck:  Supple; no masses or thyromegaly. Lungs:  No respiratory distress Abdomen:  Soft, nontender and nondistended. No masses, hepatosplenomegaly or hernias noted. Normal bowel sounds, without guarding, and without  rebound.     Msk:  Symmetrical without gross deformities. Normal posture. Pulses:  Normal pulses noted. Extremities:  Without clubbing or edema. Neurologic:  Alert and  oriented x4;  grossly normal neurologically. Skin:  Intact without significant lesions or rashes. Psych:  Alert and cooperative. Normal mood and affect.   Lab Results: Recent Labs    10/16/23 1452 10/16/23 2223 10/17/23 0353 10/17/23 1035  WBC 7.0  --   --   --   HGB 14.3 12.7* 12.0* 12.0*  HCT 44.9 38.9* 36.9* 37.5*  PLT 203  --   --   --    BMET Recent Labs    10/16/23 1452 10/17/23 0353  NA 131* 136  K 3.7 4.0  CL 100 107  CO2 21* 21*  GLUCOSE 109* 86  BUN 19 14  CREATININE 1.15 1.16  CALCIUM 8.9 8.3*   LFT Recent Labs    10/16/23 1452  PROT 7.9  ALBUMIN 4.4  AST 20  ALT 21  ALKPHOS 53  BILITOT 0.7   PT/INR No results for input(s): "LABPROT", "INR" in the last 72 hours.  Studies/Results: CT ANGIO GI BLEED  Result Date: 10/16/2023 CLINICAL DATA:  Epigastric burning sensation 1 day ago with new lower abdominal pain and bloating associated with 2 episodes of melena. EXAM: CTA ABDOMEN AND PELVIS WITHOUT AND WITH CONTRAST TECHNIQUE: Multidetector CT imaging of the abdomen and pelvis was performed using the standard protocol during bolus administration of intravenous contrast. Multiplanar reconstructed images and MIPs were obtained and reviewed to evaluate the vascular anatomy. RADIATION DOSE REDUCTION: This exam was performed according to the departmental dose-optimization program which includes automated exposure control, adjustment of the mA and/or kV according to patient size and/or use of iterative reconstruction technique. CONTRAST:  OMNIPAQUE IOHEXOL 350 MG/ML SOLN COMPARISON:  CTA abdomen and pelvis dated 05/11/2023 FINDINGS: VASCULAR Aorta: Aortic atherosclerosis. Normal caliber aorta without aneurysm, dissection, vasculitis or significant stenosis. Celiac: Patent without evidence of  aneurysm, dissection, vasculitis or significant stenosis. SMA: Patent without evidence of aneurysm, dissection, vasculitis or significant stenosis. Renals: Both renal arteries are patent without evidence of aneurysm, dissection, vasculitis, fibromuscular dysplasia or significant stenosis. IMA: Patent without evidence of aneurysm, dissection, vasculitis or significant stenosis. Inflow: Patent without evidence of aneurysm, dissection, vasculitis or significant stenosis. Proximal Outflow: Bilateral common femoral and visualized portions of the superficial and profunda femoral arteries are patent without evidence of aneurysm, dissection, vasculitis or significant stenosis. Veins: No obvious venous abnormality within the limitations of this arterial phase study. Review of the MIP images confirms the above findings. NON-VASCULAR Lower chest: No focal consolidation or pulmonary nodule in the lung bases. No pleural effusions. Left atrial enlargement. Coronary artery calcifications. Hepatobiliary: No focal hepatic lesions. No intra or extrahepatic biliary ductal dilation. Cholecystectomy. Pancreas: No focal lesions or main ductal dilation. Spleen: Normal in size without focal abnormality. Adrenals/Urinary Tract: 1.1 cm right adrenal nodule measures -8 HU, likely adenoma. No specific follow-up imaging recommended. No left adrenal nodule. Right upper pole renal cortical scarring. No suspicious renal mass or hydronephrosis. Punctate bilateral nonobstructing stones. Suboptimal evaluation of left bladder wall due to beam hardening artifact from hip arthroplasties. No focal mural thickening of the right bladder. Stomach/Bowel: Normal appearance of the stomach. No evidence of bowel wall thickening, distention, or inflammatory changes. Appendix is not  discretely seen. Lymphatic: No enlarged abdominal or pelvic lymph nodes. Reproductive: Prostate is unremarkable. Other: No free fluid, fluid collection, or free air. Musculoskeletal:  No acute or abnormal lytic or blastic osseous lesions. Multilevel degenerative changes of the partially imaged thoracic and lumbar spine. Left hip arthroplasty. 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 Surgical changes of the right inguinal region. IMPRESSION: 1. No evidence of active extravasation. 2. No acute abdominopelvic findings. 3. Punctate bilateral nonobstructing stones. 4. Aortic Atherosclerosis (ICD10-I70.0). Coronary artery calcifications. Assessment for potential risk factor modification, dietary therapy or pharmacologic therapy may be warranted, if clinically indicated. Electronically Signed   By: Agustin Cree M.D.   On: 10/16/2023 20:02     Impression:   Melena + hematochezia. Acute blood loss anemia, mild. Chronic anticoagulation (Eliquis), last dose 2 days ago per patient. Atrial fibrillation. Personal history of gastric ulcer.  Plan:   NPO. PPI. Minimize/avoid NSAIDs. Eliquis on hold. Endoscopy today for evaluation. Risks (bleeding, infection, bowel perforation that could require surgery, sedation-related changes in cardiopulmonary systems), benefits (identification and possible treatment of source of symptoms, exclusion of certain causes of symptoms), and alternatives (watchful waiting, radiographic imaging studies, empiric medical treatment) of upper endoscopy (EGD) were explained to patient/family in detail and patient wishes to proceed.    LOS: 1 day   Allea Kassner M  10/17/2023, 11:25 AM  Cell [phone] If no answer or after 5 PM call [phone]

## 2023-10-17 NOTE — H&P (View-Only) (Signed)
[facility] Consultation Note  Referring Provider: [facility] Primary Care Physician:  System, Provider Not In Primary Gastroenterologist:  Unassigned  Reason for Consultation:  GI bleeding  HPI: Parker Daniel is a 67 y.o. male admitted GI bleeding.  MMP as below.  On eliquis, last dose 2 days ago.  2 days of hematochezia and melena.  Hgb 12.  CTA negative bleeding source.  Denies prior GI bleeding, but does report history of gastric ulcer few years ago.  Takes NSAIDs regularly for back pain.  Reports having had endoscopy and colonoscopy about one year ago in [place] (results not known).  No hematemesis.   Past Medical History:  Diagnosis Date   Arthritis    Coronary artery disease    Diabetes mellitus without complication (HCC)    Dyspnea    with exertion   GERD (gastroesophageal reflux disease)    Gout    History of kidney stones    Hyperlipidemia    Hypertension    Pneumonia    10/2021    Past Surgical History:  Procedure Laterality Date   APPENDECTOMY     age 49   CARDIOVERSION N/A 06/11/2023   Procedure: CARDIOVERSION;  Surgeon: Thurmon Fair, MD;  Location: [facility];  Service: Cardiovascular;  Laterality: N/A;   CHOLECYSTECTOMY     age 88   CYSTOSCOPY/URETEROSCOPY/HOLMIUM LASER/STENT PLACEMENT Right 01/24/2022   Procedure: CYSTOSCOPY/RETROGRADE/URETEROSCOPY/HOLMIUM LASER/STENT PLACEMENT;  Surgeon: Jerilee Field, MD;  Location: [facility];  Service: Urology;  Laterality: Right;   KNEE SURGERY     SURGERY SCROTAL / TESTICULAR     age 83   THULIUM LASER TURP (TRANSURETHRAL RESECTION OF PROSTATE) N/A 01/24/2022   Procedure: THULIUM LASER TURP (TRANSURETHRAL RESECTION OF PROSTATE);  Surgeon: Jerilee Field, MD;  Location: [facility];  Service: Urology;  Laterality: N/A;   TONSILLECTOMY     TOTAL HIP ARTHROPLASTY Left 06/27/2019   Procedure: LEFT TOTAL HIP ARTHROPLASTY ANTERIOR APPROACH;  Surgeon: Kathryne Hitch, MD;  Location: [facility]  [facility];  Service: Orthopedics;  Laterality: Left;   TOTAL KNEE ARTHROPLASTY Right 03/17/2022   Procedure: RIGHT TOTAL KNEE ARTHROPLASTY;  Surgeon: Kathryne Hitch, MD;  Location: [facility];  Service: Orthopedics;  Laterality: Right;    Prior to Admission medications   Medication Sig Start Date End Date Taking? Authorizing Provider  acetaminophen (TYLENOL) 500 MG tablet Take 1,000 mg by mouth every 8 (eight) hours as needed for moderate pain.   Yes [provider]  allopurinol (ZYLOPRIM) 300 MG tablet Take 300 mg by mouth daily at 6 PM. Pt takes at 6 pm at nite   Yes [provider]  atorvastatin (LIPITOR) 40 MG tablet Take 1 tablet (40 mg total) by mouth daily. 12/08/22 12/03/23 Yes O'Neal, Ronnald Ramp, MD  ezetimibe (ZETIA) 10 MG tablet Take 1 tablet by mouth daily. 08/30/22 10/17/23 Yes [provider]  FEROSUL 325 (65 Fe) MG tablet Take 325 mg by mouth every other day. 01/31/23  Yes [provider]  MULTAQ 400 MG tablet Take 400 mg by mouth 2 (two) times daily. Pt takes at 6pm and 6 am 11/22/21  Yes [provider]  pregabalin (LYRICA) 75 MG capsule Take 75 mg by mouth 3 (three) times daily.   Yes [provider]  senna (SENOKOT) 8.6 MG tablet Take 2-3 tablets by mouth daily as needed for constipation.   Yes [provider]  tamsulosin (FLOMAX) 0.4 MG CAPS capsule Take 0.4 mg by mouth at bedtime. 9 pm 01/03/22  Yes [provider]  testosterone cypionate (DEPOTESTOSTERONE CYPIONATE) 200 MG/ML injection Inject 200 mg into the muscle every 21 ( twenty-one) days.   Yes [provider]  traZODone (DESYREL) 50 MG tablet Take 50 mg by mouth at bedtime.   Yes [provider]  ELIQUIS 5 MG TABS tablet Take 1 tablet (5 mg total) by mouth 2 (two) times daily. Patient not taking: Reported on 10/17/2023 01/26/22   Jerilee Field, MD  oxyCODONE (ROXICODONE) 15 MG immediate release tablet Take 15 mg by mouth 2 (two) times  daily as needed for pain. Patient not taking: Reported on 10/17/2023 12/02/21   [provider]  OZEMPIC, 1 MG/DOSE, 4 MG/3ML SOPN Inject 1 mg into the skin once a week. Patient not taking: Reported on 10/17/2023    [provider]  XTAMPZA ER 18 MG C12A Take 18 mg by mouth every 12 (twelve) hours. Pt takes at 6pm and 6 am Patient not taking: Reported on 10/17/2023 12/31/21   [provider]    Current Facility-Administered Medications  Medication Dose Route Frequency Provider Last Rate Last Admin   acetaminophen (TYLENOL) tablet 650 mg  650 mg Oral Q6H PRN Opyd, Lavone Neri, MD       Or   acetaminophen (TYLENOL) suppository 650 mg  650 mg Rectal Q6H PRN Opyd, Lavone Neri, MD       atorvastatin (LIPITOR) tablet 40 mg  40 mg Oral Daily Opyd, Lavone Neri, MD   40 mg at 10/17/23 6237   dronedarone (MULTAQ) tablet 400 mg  400 mg Oral BID Rhetta Mura, MD   400 mg at 10/17/23 1124   HYDROmorphone (DILAUDID) injection 0.5 mg  0.5 mg Intravenous Q4H PRN Opyd, Lavone Neri, MD   0.5 mg at 10/17/23 6283   influenza vaccine adjuvanted (FLUAD) injection 0.5 mL  0.5 mL Intramuscular Tomorrow-1000 Opyd, Lavone Neri, MD       insulin aspart (novoLOG) injection 0-6 Units  0-6 Units Subcutaneous Q4H Opyd, Lavone Neri, MD       metoprolol tartrate (LOPRESSOR) injection 2.5 mg  2.5 mg Intravenous Q2H PRN Opyd, Lavone Neri, MD       ondansetron (ZOFRAN) tablet 4 mg  4 mg Oral Q6H PRN Opyd, Lavone Neri, MD       Or   ondansetron (ZOFRAN) injection 4 mg  4 mg Intravenous Q6H PRN Opyd, Lavone Neri, MD       oxyCODONE (Oxy IR/ROXICODONE) immediate release tablet 15 mg  15 mg Oral BID PRN Rhetta Mura, MD       oxyCODONE (OXYCONTIN) 12 hr tablet 20 mg  20 mg Oral Q12H Rhetta Mura, MD       pantoprazole (PROTONIX) injection 40 mg  40 mg Intravenous Q12H Opyd, Lavone Neri, MD   40 mg at 10/17/23 1517   pneumococcal 20-valent conjugate vaccine (PREVNAR 20) injection 0.5 mL  0.5 mL  Intramuscular Tomorrow-1000 Opyd, Lavone Neri, MD       senna (SENOKOT) tablet 17.2 mg  2 tablet Oral Daily PRN Rhetta Mura, MD       sodium chloride flush (NS) 0.9 % injection 3 mL  3 mL Intravenous Q12H Opyd, Lavone Neri, MD   3 mL at 10/17/23 0809   tamsulosin (FLOMAX) capsule 0.4 mg  0.4 mg Oral QHS Opyd, Lavone Neri, MD   0.4 mg at 10/16/23 2115   traZODone (DESYREL) tablet 50 mg  50 mg Oral QHS Briscoe Deutscher, MD   50 mg at 10/16/23 2115  Allergies as of 10/16/2023 - Review Complete 10/16/2023  Allergen Reaction Noted   Atorvastatin Other (See Comments) 12/02/2010   Rosuvastatin Other (See Comments) 12/02/2010    Family History  Problem Relation Age of Onset   Cancer Father        bladder    Social History   Socioeconomic History   Marital status: Married    Spouse name: Not on file   Number of children: 1   Years of education: Not on file   Highest education level: Not on file  Occupational History   Not on file  Tobacco Use   Smoking status: Never   Smokeless tobacco: Never  Vaping Use   Vaping status: Never Used  Substance and Sexual Activity   Alcohol use: Yes    Comment: rare   Drug use: Never   Sexual activity: Yes    Birth control/protection: None  Other Topics Concern   Not on file  Social History Narrative   Not on file   Social Determinants of Health   Financial Resource Strain: Medium Risk (11/28/2022)   Received from [facility], [facility], [facility], [facility], [facility]   Overall Financial Resource Strain (CARDIA)    Difficulty of Paying Living Expenses: Somewhat hard  Food Insecurity: No Food Insecurity (10/16/2023)   Hunger Vital Sign    Worried About Running Out of Food in the Last Year: Never true    Ran Out of Food in the Last Year: Never true  Transportation Needs: No Transportation Needs (10/16/2023)   PRAPARE - Administrator, Civil Service (Medical): No     Lack of Transportation (Non-Medical): No  Physical Activity: Not on file  Stress: Not on file  Social Connections: Unknown (01/09/2023)   Received from [facility], [facility]   Social Network    Social Network: Not on file  Recent Concern: Social Connections - Moderately Isolated (11/28/2022)   Received from [facility], [facility], [facility], [facility]   Social Connection and Isolation Panel [NHANES]    Frequency of Communication with Friends and Family: More than three times a week    Frequency of Social Gatherings with Friends and Family: Twice a week    Attends Religious Services: Never    Database administrator or Organizations: No    Attends Banker Meetings: Never    Marital Status: Married  Catering manager Violence: Not At Risk (10/16/2023)   Humiliation, Afraid, Rape, and Kick questionnaire    Fear of Current or Ex-Partner: No    Emotionally Abused: No    Physically Abused: No    Sexually Abused: No    Review of Systems: As per HPI, all others negative  Physical Exam: Vital signs in last 24 hours: Temp:  [97.7 F (36.5 C)-99 F (37.2 C)] 98.2 F (36.8 C) (12/04 0931) Pulse Rate:  [78-105] 78 (12/04 0931) Resp:  [16-20] 20 (12/04 0931) BP: (104-146)/(70-91) 104/70 (12/04 0931) SpO2:  [95 %-100 %] 95 % (12/04 0931) Weight:  [100.3 kg-107 kg] 100.3 kg (12/04 0638) Last BM Date : 10/16/23 General:   Alert,  Well-developed, well-nourished, pleasant and cooperative in NAD Head:  Normocephalic and atraumatic. Eyes:  Sclera clear, no icterus.   Conjunctiva pink. Ears:  Normal auditory acuity. Nose:  No deformity, discharge,  or lesions. Mouth:  No deformity or lesions.  Oropharynx pink & moist. Neck:  Supple; no masses or thyromegaly. Lungs:  No respiratory distress Abdomen:  Soft, nontender and nondistended. No masses, hepatosplenomegaly or hernias noted. Normal bowel sounds, without guarding, and without  rebound.     Msk:  Symmetrical without gross deformities. Normal posture. Pulses:  Normal pulses noted. Extremities:  Without clubbing or edema. Neurologic:  Alert and  oriented x4;  grossly normal neurologically. Skin:  Intact without significant lesions or rashes. Psych:  Alert and cooperative. Normal mood and affect.   Lab Results: Recent Labs    10/16/23 1452 10/16/23 2223 10/17/23 0353 10/17/23 1035  WBC 7.0  --   --   --   HGB 14.3 12.7* 12.0* 12.0*  HCT 44.9 38.9* 36.9* 37.5*  PLT 203  --   --   --    BMET Recent Labs    10/16/23 1452 10/17/23 0353  NA 131* 136  K 3.7 4.0  CL 100 107  CO2 21* 21*  GLUCOSE 109* 86  BUN 19 14  CREATININE 1.15 1.16  CALCIUM 8.9 8.3*   LFT Recent Labs    10/16/23 1452  PROT 7.9  ALBUMIN 4.4  AST 20  ALT 21  ALKPHOS 53  BILITOT 0.7   PT/INR No results for input(s): "LABPROT", "INR" in the last 72 hours.  Studies/Results: CT ANGIO GI BLEED  Result Date: 10/16/2023 CLINICAL DATA:  Epigastric burning sensation 1 day ago with new lower abdominal pain and bloating associated with 2 episodes of melena. EXAM: CTA ABDOMEN AND PELVIS WITHOUT AND WITH CONTRAST TECHNIQUE: Multidetector CT imaging of the abdomen and pelvis was performed using the standard protocol during bolus administration of intravenous contrast. Multiplanar reconstructed images and MIPs were obtained and reviewed to evaluate the vascular anatomy. RADIATION DOSE REDUCTION: This exam was performed according to the departmental dose-optimization program which includes automated exposure control, adjustment of the mA and/or kV according to patient size and/or use of iterative reconstruction technique. CONTRAST:  OMNIPAQUE IOHEXOL 350 MG/ML SOLN COMPARISON:  CTA abdomen and pelvis dated 05/11/2023 FINDINGS: VASCULAR Aorta: Aortic atherosclerosis. Normal caliber aorta without aneurysm, dissection, vasculitis or significant stenosis. Celiac: Patent without evidence of  aneurysm, dissection, vasculitis or significant stenosis. SMA: Patent without evidence of aneurysm, dissection, vasculitis or significant stenosis. Renals: Both renal arteries are patent without evidence of aneurysm, dissection, vasculitis, fibromuscular dysplasia or significant stenosis. IMA: Patent without evidence of aneurysm, dissection, vasculitis or significant stenosis. Inflow: Patent without evidence of aneurysm, dissection, vasculitis or significant stenosis. Proximal Outflow: Bilateral common femoral and visualized portions of the superficial and profunda femoral arteries are patent without evidence of aneurysm, dissection, vasculitis or significant stenosis. Veins: No obvious venous abnormality within the limitations of this arterial phase study. Review of the MIP images confirms the above findings. NON-VASCULAR Lower chest: No focal consolidation or pulmonary nodule in the lung bases. No pleural effusions. Left atrial enlargement. Coronary artery calcifications. Hepatobiliary: No focal hepatic lesions. No intra or extrahepatic biliary ductal dilation. Cholecystectomy. Pancreas: No focal lesions or main ductal dilation. Spleen: Normal in size without focal abnormality. Adrenals/Urinary Tract: 1.1 cm right adrenal nodule measures -8 HU, likely adenoma. No specific follow-up imaging recommended. No left adrenal nodule. Right upper pole renal cortical scarring. No suspicious renal mass or hydronephrosis. Punctate bilateral nonobstructing stones. Suboptimal evaluation of left bladder wall due to beam hardening artifact from hip arthroplasties. No focal mural thickening of the right bladder. Stomach/Bowel: Normal appearance of the stomach. No evidence of bowel wall thickening, distention, or inflammatory changes. Appendix is not  discretely seen. Lymphatic: No enlarged abdominal or pelvic lymph nodes. Reproductive: Prostate is unremarkable. Other: No free fluid, fluid collection, or free air. Musculoskeletal:  No acute or abnormal lytic or blastic osseous lesions. Multilevel degenerative changes of the partially imaged thoracic and lumbar spine. Left hip arthroplasty. 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 Surgical changes of the right inguinal region. IMPRESSION: 1. No evidence of active extravasation. 2. No acute abdominopelvic findings. 3. Punctate bilateral nonobstructing stones. 4. Aortic Atherosclerosis (ICD10-I70.0). Coronary artery calcifications. Assessment for potential risk factor modification, dietary therapy or pharmacologic therapy may be warranted, if clinically indicated. Electronically Signed   By: Agustin Cree M.D.   On: 10/16/2023 20:02     Impression:   Melena + hematochezia. Acute blood loss anemia, mild. Chronic anticoagulation (Eliquis), last dose 2 days ago per patient. Atrial fibrillation. Personal history of gastric ulcer.  Plan:   NPO. PPI. Minimize/avoid NSAIDs. Eliquis on hold. Endoscopy today for evaluation. Risks (bleeding, infection, bowel perforation that could require surgery, sedation-related changes in cardiopulmonary systems), benefits (identification and possible treatment of source of symptoms, exclusion of certain causes of symptoms), and alternatives (watchful waiting, radiographic imaging studies, empiric medical treatment) of upper endoscopy (EGD) were explained to patient/family in detail and patient wishes to proceed.    LOS: 1 day   Allea Kassner M  10/17/2023, 11:25 AM  Cell [phone] If no answer or after 5 PM call [phone]

## 2023-10-17 NOTE — Care Management CC44 (Signed)
Condition Code 44 Documentation Completed  Patient Details  Name: Parker Daniel MRN: [id number] Date of Birth: [birth year]   Condition Code 44 given:  Yes Patient signature on Condition Code 44 notice:  Yes Documentation of 2 MD's agreement:  Yes Code 44 added to claim:  Yes    Armanda Heritage, RN 10/17/2023, 3:35 PM

## 2023-10-17 NOTE — Anesthesia Preprocedure Evaluation (Signed)
Anesthesia Evaluation  Patient identified by MRN, date of birth, ID band Patient awake    Reviewed: Allergy & Precautions, H&P , NPO status , Patient's Chart, lab work & pertinent test results  Airway Mallampati: II  TM Distance: >3 FB Neck ROM: Full    Dental no notable dental hx.    Pulmonary neg pulmonary ROS   Pulmonary exam normal breath sounds clear to auscultation       Cardiovascular hypertension, Pt. on medications Normal cardiovascular exam(-) dysrhythmias Atrial Fibrillation  Rhythm:Regular Rate:Normal  Afib with RVR   Neuro/Psych Chronic narcotic use  negative psych ROS   GI/Hepatic Neg liver ROS,GERD  ,,  Endo/Other  diabetes    Renal/GU negative Renal ROS  negative genitourinary   Musculoskeletal negative musculoskeletal ROS (+)    Abdominal   Peds negative pediatric ROS (+)  Hematology  (+) Blood dyscrasia, anemia   Anesthesia Other Findings   Reproductive/Obstetrics negative OB ROS                             Anesthesia Physical Anesthesia Plan  ASA: 3  Anesthesia Plan: MAC   Post-op Pain Management: Minimal or no pain anticipated   Induction: Intravenous  PONV Risk Score and Plan: 1 and Propofol infusion and Treatment may vary due to age or medical condition  Airway Management Planned: Simple Face Mask  Additional Equipment:   Intra-op Plan:   Post-operative Plan:   Informed Consent: I have reviewed the patients History and Physical, chart, labs and discussed the procedure including the risks, benefits and alternatives for the proposed anesthesia with the patient or authorized representative who has indicated his/her understanding and acceptance.     Dental advisory given  Plan Discussed with: CRNA and Surgeon  Anesthesia Plan Comments:        Anesthesia Quick Evaluation

## 2023-10-17 NOTE — Progress Notes (Signed)
HOSPITALIST ROUNDING NOTE Parker Daniel:[id number]  DOB: [birth year]  DOA: 10/16/2023  PCP: System, Provider Not In  10/17/2023,7:05 AM   LOS: 1 day      Code Status: full From:  home     Current Dispo: Likely back home     67 year old white male known left total hip repair 2020 Right knee repair 2023 Persistent A-fib on Multaq/Eliquis CHADVASC >4-cardioversion 06/11/2023-remains in A-fib-?  Ablation candidate last seen by Dr. Elberta Fortis 07/31/2023 Chronic pain secondary to multiple surgeries as above Known right renal lesion BMI 33 6/28-6/29 hospitalization AKI 2/2 outdoor work not enough hydration volume depletion losartan etc. and syncope  Brought to emergency room with burning sensation to upper abdomen 2 episodes tarry black stool with nausea as well as dull ache in umbilicus no vomit Found to be in A-fib CT angiogram negative  for bleed BUN/creatinine 19/1.1 hemoglobin 14, FOBT positive Rx in ED saline Protonix Zofran Dilaudid  12/4 endoscopy normal esophagus gastritis nonbleeding gastric ulcer with no stigmata of bleeding normal duodenal bulb and second part of duodenum  Plan   acute GI bleedSecondary to nonbleeding gastric ulcer 2/2 anticoagulation + NSAIDs-avoid NSAIDs going forward at all costs-pain control will have to be managed with other modalities and has been tapered down off of some of the high-dose opiates that previously he was on Anticoagulation to be resumed in 10 days Watch H. pylori and treat as per GI  Permanent A-fib on Multaq/Eliquis Continue metoprolol as needed Multaq 400 twice daily, watch potassium replace if below 3.8 tomorrow and sent home with supplementation Should follow-up with his electrophysiologist for consideration of ablation-CC Dr. Elberta Fortis at discharge  Multiple orthopedic procedures with chronic pain Clarified with patient he is taking oxycodone 5 mg 4-6 hourly-he is not taking gabapentin he is taking Lyrica 75 3 times daily which has been  resumed  Known right renal lesion Needs outpatient characterization with nonemergent MRI  BMI 33  No family present  DVT prophylaxis: SCD  Status is: Inpatient Remains inpatient appropriate because: Requires overnight monitoring of labs and likely can discharge when taking full diet      Subjective: Coherent pleasant tolerated some liquids no pain no fever Last bleeding episode was on 12/3 No nausea no vomiting   Objective + exam Vitals:   10/16/23 2159 10/16/23 2213 10/17/23 0421 10/17/23 0638  BP: (!) 127/91  118/79 113/75  Pulse: 99  80 84  Resp: 18  17 20   Temp: 98.7 F (37.1 C)  99 F (37.2 C) 98.5 F (36.9 C)  TempSrc: Oral  Oral Oral  SpO2: 97%  96% 98%  Weight:  100.3 kg  100.3 kg  Height:  5\' 10"  (1.778 m)     Filed Weights   10/16/23 1442 10/16/23 2213 10/17/23 0638  Weight: 107 kg 100.3 kg 100.3 kg    Examination:  EOMI NCAT no focal deficit no icterus no pallor CTAB no added sound no wheeze rales rhonchi S1-S2 no murmur-on monitors seems to be A-fib NSVT with rates in the 70s and rate controlled Abdomen soft no rebound no guarding No lower extremity edema Neuro intact no focal deficit   Data Reviewed: reviewed   CBC    Component Value Date/Time   WBC 7.0 10/16/2023 1452   RBC 4.71 10/16/2023 1452   HGB 12.0 (L) 10/17/2023 0353   HGB 14.1 06/07/2023 1532   HCT 36.9 (L) 10/17/2023 0353   HCT 44.0 06/07/2023 1532   PLT 203 10/16/2023 1452  PLT 197 06/07/2023 1532   MCV 95.3 10/16/2023 1452   MCV 93 06/07/2023 1532   MCH 30.4 10/16/2023 1452   MCHC 31.8 10/16/2023 1452   RDW 13.4 10/16/2023 1452   RDW 13.2 06/07/2023 1532   LYMPHSABS 1.5 05/11/2023 1546   MONOABS 0.9 05/11/2023 1546   EOSABS 0.0 05/11/2023 1546   BASOSABS 0.0 05/11/2023 1546      Latest Ref Rng & Units 10/17/2023    3:53 AM 10/16/2023    2:52 PM 06/07/2023    3:32 PM  CMP  Glucose 70 - 99 mg/dL 86  161  74   BUN 8 - 23 mg/dL 14  19  17    Creatinine 0.61 - 1.24  mg/dL 0.96  0.45  4.09   Sodium 135 - 145 mmol/L 136  131  138   Potassium 3.5 - 5.1 mmol/L 4.0  3.7  4.8   Chloride 98 - 111 mmol/L 107  100  102   CO2 22 - 32 mmol/L 21  21  23    Calcium 8.9 - 10.3 mg/dL 8.3  8.9  9.2   Total Protein 6.5 - 8.1 g/dL  7.9    Total Bilirubin <1.2 mg/dL  0.7    Alkaline Phos 38 - 126 U/L  53    AST 15 - 41 U/L  20    ALT 0 - 44 U/L  21       Scheduled Meds: Visible mucus  Continuous Infusions:    Time 46  Rhetta Mura, MD  [facility]

## 2023-10-17 NOTE — Op Note (Signed)
[facility] Patient Name: Parker Daniel Procedure Date: 10/17/2023 MRN: [id number] Attending MD: Willis Modena , MD, [id number] Date of Birth: 12-29-1955 CSN: [id number] Age: 67 Admit Type: Inpatient Procedure:                Upper GI endoscopy Indications:              Acute post hemorrhagic anemia, Hematochezia, Melena Providers:                Willis Modena, MD, Fransisca Connors, Rozetta Nunnery, Technician Referring MD:             [facility] Medicines:                Monitored Anesthesia Care Complications:            No immediate complications. Estimated Blood Loss:     Estimated blood loss: none. Procedure:                Pre-Anesthesia Assessment:                           - Prior to the procedure, a History and Physical                            was performed, and patient medications and                            allergies were reviewed. The patient's tolerance of                            previous anesthesia was also reviewed. The risks                            and benefits of the procedure and the sedation                            options and risks were discussed with the patient.                            All questions were answered, and informed consent                            was obtained. Prior Anticoagulants: The patient has                            taken Eliquis (apixaban), last dose was 2 days                            prior to procedure. Patient takes NSAIDs regularly.                            ASA Grade Assessment: III - A patient with severe  systemic disease. After reviewing the risks and                            benefits, the patient was deemed in satisfactory                            condition to undergo the procedure.                           After obtaining informed consent, the endoscope was                            passed under direct vision. Throughout the                             procedure, the patient's blood pressure, pulse, and                            oxygen saturations were monitored continuously. The                            [id number]-[id number] ([id number]) Olympus endoscope was introduced                            through the mouth, and advanced to the second part                            of duodenum. The upper GI endoscopy was                            accomplished without difficulty. The patient                            tolerated the procedure well. Scope In: Scope Out: Findings:      The examined esophagus was normal.      Patchy mild inflammation was found in the entire examined stomach.       Biopsies were taken with a cold forceps for histology.      One non-bleeding cratered gastric ulcer with no stigmata of bleeding was       found in the gastric antrum. The lesion was 8 mm in largest dimension.      The exam of the stomach was otherwise normal.      The duodenal bulb, first portion of the duodenum and second portion of       the duodenum were normal. Impression:               - Normal esophagus.                           - Gastritis. Biopsied.                           - Non-bleeding gastric ulcer with no stigmata of  bleeding. Highly likely source of bleeding.                           - Normal duodenal bulb, first portion of the                            duodenum and second portion of the duodenum. Moderate Sedation:      Not Applicable - Patient had care per Anesthesia. Recommendation:           - Return patient to hospital ward for ongoing care.                           - Soft diet today.                           - Continue present medications.                           - Avoid NSAIDs indefinitely.                           - Would not restart Eliquis for at least another 10                            days.                           - Await pathology results and treat for H. pylori                             if positive.                           - Will need repeat endoscopy in 3 months to ensure                            gastric ulcer healing.                           - Pending clinical course, suspect patient will be                            able to be discharged home tomorrow.                           [facility] will follow. Procedure Code(s):        --- Professional ---                           [id number], Esophagogastroduodenoscopy, flexible,                            transoral; with biopsy, single or multiple Diagnosis Code(s):        --- Professional ---  K29.70, Gastritis, unspecified, without bleeding                           K25.9, Gastric ulcer, unspecified as acute or                            chronic, without hemorrhage or perforation                           D62, Acute posthemorrhagic anemia                           K92.1, Melena (includes Hematochezia) CPT copyright 2022 American Medical Association. All rights reserved. The codes documented in this report are preliminary and upon coder review may  be revised to meet current compliance requirements. Willis Modena, MD 10/17/2023 12:47:10 PM This report has been signed electronically. Number of Addenda: 0

## 2023-10-17 NOTE — Care Management CC44 (Signed)
Condition Code 44 Documentation Completed  Patient Details  Name: Parker Daniel MRN: [id number] Date of Birth: 11/29/1955   Condition Code 44 given:    Patient signature on Condition Code 44 notice:    Documentation of 2 MD's agreement:    Code 44 added to claim:       Howell Rucks, RN 10/17/2023, 3:07 PM

## 2023-10-17 NOTE — TOC CM/SW Note (Addendum)
Transition of Care [facility]) - Inpatient Brief Assessment   Patient Details  Name: Parker Daniel MRN: [id number] Date of Birth: 20-Nov-1955  [facility] CM/SW Contact:    Howell Rucks, RN Phone Number: 10/17/2023, 10:33 AM   Clinical Narrative: Met with pt and spouse at bedside to introduce role of TOC/NCM and review for dc planning. Pt confirms he has an established PCP and pharmacy, no current home care services, has a walker at home he uses as needed, reports he feels safe returning home with support from his spouse, spouse will provide transportation at discharge. TOC Brief Assessment completed. No TOC needs identified at this time.   -3:27pm Code 44 completed    Transition of Care Asessment: Insurance and Status: Insurance coverage has been reviewed Patient has primary care physician: Yes Home environment has been reviewed: resides in private residence with spouse Prior level of function:: Independent Prior/Current Home Services: No current home services Social Determinants of Health Reivew: SDOH reviewed no interventions necessary Readmission risk has been reviewed: Yes Transition of care needs: no transition of care needs at this time

## 2023-10-17 NOTE — Care Management Obs Status (Signed)
MEDICARE OBSERVATION STATUS NOTIFICATION   Patient Details  Name: CLENNON MACZKO MRN: [id number] Date of Birth: 1956/01/05   Medicare Observation Status Notification Given:  Yes    Armanda Heritage, RN 10/17/2023, 3:35 PM

## 2023-10-17 NOTE — Care Management Obs Status (Signed)
MEDICARE OBSERVATION STATUS NOTIFICATION   Patient Details  Name: Parker Daniel MRN: [id number] Date of Birth: 1956-08-05   Medicare Observation Status Notification Given:       Howell Rucks, RN 10/17/2023, 3:26 PM

## 2023-10-18 DIAGNOSIS — K922 Gastrointestinal hemorrhage, unspecified: Secondary | ICD-10-CM | POA: Diagnosis not present

## 2023-10-18 LAB — BASIC METABOLIC PANEL
Anion gap: 7 (ref 5–15)
BUN: 20 mg/dL (ref 8–23)
CO2: 23 mmol/L (ref 22–32)
Calcium: 8.8 mg/dL — ABNORMAL LOW (ref 8.9–10.3)
Chloride: 107 mmol/L (ref 98–111)
Creatinine, Ser: 1.02 mg/dL (ref 0.61–1.24)
GFR, Estimated: >60 mL/min (ref >60)
Glucose, Bld: 162 mg/dL — ABNORMAL HIGH (ref 70–99)
Potassium: 4.1 mmol/L (ref 3.5–5.1)
Sodium: 137 mmol/L (ref 135–145)

## 2023-10-18 LAB — CBC WITH DIFFERENTIAL/PLATELET
Abs Immature Granulocytes: 0.07 10*3/uL (ref 0.00–0.07)
Basophils Absolute: 0 10*3/uL (ref 0.0–0.1)
Basophils Relative: 0 %
Eosinophils Absolute: 0 10*3/uL (ref 0.0–0.5)
Eosinophils Relative: 0 %
HCT: 38.8 % — ABNORMAL LOW (ref 39.0–52.0)
Hemoglobin: 12.5 g/dL — ABNORMAL LOW (ref 13.0–17.0)
Immature Granulocytes: 1 %
Lymphocytes Relative: 22 %
Lymphs Abs: 1.1 10*3/uL (ref 0.7–4.0)
MCH: 30.6 pg (ref 26.0–34.0)
MCHC: 32.2 g/dL (ref 30.0–36.0)
MCV: 94.9 fL (ref 80.0–100.0)
Monocytes Absolute: 0.6 10*3/uL (ref 0.1–1.0)
Monocytes Relative: 11 %
Neutro Abs: 3.2 10*3/uL (ref 1.7–7.7)
Neutrophils Relative %: 66 %
Platelets: 176 10*3/uL (ref 150–400)
RBC: 4.09 MIL/uL — ABNORMAL LOW (ref 4.22–5.81)
RDW: 13.2 % (ref 11.5–15.5)
WBC: 4.8 10*3/uL (ref 4.0–10.5)
nRBC: 0 % (ref 0.0–0.2)

## 2023-10-18 LAB — SURGICAL PATHOLOGY

## 2023-10-18 MED ORDER — OXYCODONE HCL 5 MG PO TABS: 5.0000 mg | ORAL_TABLET | ORAL | Status: DC | PRN

## 2023-10-18 MED ORDER — ELIQUIS 5 MG PO TABS: 5.0000 mg | ORAL_TABLET | Freq: Two times a day (BID) | ORAL | Status: AC

## 2023-10-18 MED ORDER — PANTOPRAZOLE SODIUM 40 MG PO TBEC: 40.0000 mg | DELAYED_RELEASE_TABLET | Freq: Two times a day (BID) | ORAL | 11 refills | Status: AC

## 2023-10-18 NOTE — Anesthesia Postprocedure Evaluation (Signed)
Anesthesia Post Note  Patient: Parker Daniel  Procedure(s) Performed: ESOPHAGOGASTRODUODENOSCOPY (EGD) WITH PROPOFOL (Left) BIOPSY     Patient location during evaluation: PACU Anesthesia Type: MAC Level of consciousness: awake and alert Pain management: pain level controlled Vital Signs Assessment: post-procedure vital signs reviewed and stable Respiratory status: spontaneous breathing, nonlabored ventilation, respiratory function stable and patient connected to nasal cannula oxygen Cardiovascular status: stable and blood pressure returned to baseline Postop Assessment: no apparent nausea or vomiting Anesthetic complications: no  No notable events documented.  Last Vitals:  Vitals:   10/17/23 1951 10/18/23 0446  BP: 123/83 120/77  Pulse: 95 77  Resp: 17 18  Temp: 36.7 C 36.8 C  SpO2: 95% 92%    Last Pain:  Vitals:   10/18/23 0912  TempSrc:   PainSc: 7                  Tylor Gambrill S

## 2023-10-18 NOTE — Discharge Summary (Signed)
Physician Discharge Summary  Parker Daniel ZOX:[id number] DOB: [birth year] DOA: 10/16/2023  PCP: System, Provider Not In  Admit date: 10/16/2023 Discharge date: 10/18/2023  Time spent: 33 minutes  Recommendations for Outpatient Follow-up:  Does require CBC Chem-12 in about 1 week Resume Eliquis in 9 to 10 days as per record Use Protonix 40 twice daily for at least 2 months and may need repeat scope as per GI Follow H. pylori CC Dr. Dulce Sellar to coordinate the same Please ensure nonemergent MRI of right renal lesion is performed  Discharge Diagnoses:  MAIN problem for hospitalization   Probable upper GI bleed secondary to gastric ulcer and NSAIDs Permanent A-fib on Eliquis Chronic pain Right renal lesion  Please see below for itemized issues addressed in HOpsital- refer to other progress notes for clarity if needed  Discharge Condition: Improved  Diet recommendation: Heart healthy  Filed Weights   10/16/23 2213 10/17/23 0638 10/18/23 0544  Weight: 100.3 kg 100.3 kg 100 kg    History of present illness:  67 year old white male known left total hip repair 2020 Right knee repair 2023 Persistent A-fib on Multaq/Eliquis CHADVASC >4-cardioversion 06/11/2023-remains in A-fib-?  Ablation candidate last seen by Dr. Elberta Fortis 07/31/2023 Chronic pain secondary to multiple surgeries as above Known right renal lesion BMI 33 6/28-6/29 hospitalization AKI 2/2 outdoor work not enough hydration volume depletion losartan etc. and syncope   Brought to emergency room with burning sensation to upper abdomen 2 episodes tarry black stool with nausea as well as dull ache in umbilicus no vomit Found to be in A-fib CT angiogram negative  for bleed BUN/creatinine 19/1.1 hemoglobin 14, FOBT positive Rx in ED saline Protonix Zofran Dilaudid   12/4 endoscopy normal esophagus gastritis nonbleeding gastric ulcer with no stigmata of bleeding normal duodenal bulb and second part of duodenum  Hospital Course:   acute GI bleedSecondary to nonbleeding gastric ulcer 2/2 anticoagulation + NSAIDs-avoid NSAIDs going forward at all costs-pain control will have to be managed with other modalities and has been tapered down off of some of the high-dose opiates that previously he was on Anticoagulation to be resumed in 10 days and date given H pylori pathology will need to be followed up from 12/4 CC Dr. Dulce Sellar in the outpatient setting   Permanent A-fib on Multaq/Eliquis Continue metoprolol as needed Multaq 400 twice daily needs labs in a week Should follow-up with his electrophysiologist for consideration of ablation-CC Dr. Elberta Fortis at discharge   Multiple orthopedic procedures with chronic pain Clarified with patient he is taking oxycodone 5 mg 4-6 hourly-, continues on Lyrica 75 3 times daily which has been resumed   Known right renal lesion Needs outpatient characterization with nonemergent MRI   BMI 33    Discharge Exam: Vitals:   10/17/23 1951 10/18/23 0446  BP: 123/83 120/77  Pulse: 95 77  Resp: 17 18  Temp: 98.1 F (36.7 C) 98.2 F (36.8 C)  SpO2: 95% 92%    Subj on day of d/c   Awake coherent no distress no icterus no pallor looks well  General Exam on discharge  EOMI NCAT no focal deficit Chest is clear no wheeze no rales no rhonchi Abdomen soft no rebound or guarding No lower extremity edema ROM intact   Discharge Instructions    Allergies as of 10/18/2023       Reactions   Atorvastatin Other (See Comments)   Other reaction(s): Myalgia Takes pravastatin at home   Rosuvastatin Other (See Comments)   Other reaction(s): Myalgia Takes  pravastatin at home        Medication List     STOP taking these medications    Ozempic (1 MG/DOSE) 4 MG/3ML Sopn Generic drug: Semaglutide (1 MG/DOSE)   Xtampza ER 18 MG C12a Generic drug: oxyCODONE ER       TAKE these medications    acetaminophen 500 MG tablet Commonly known as: TYLENOL Take 1,000 mg by mouth every 8  (eight) hours as needed for moderate pain.   allopurinol 300 MG tablet Commonly known as: ZYLOPRIM Take 300 mg by mouth daily at 6 PM. Pt takes at 6 pm at nite   atorvastatin 40 MG tablet Commonly known as: LIPITOR Take 1 tablet (40 mg total) by mouth daily.   Eliquis 5 MG Tabs tablet Generic drug: apixaban Take 1 tablet (5 mg total) by mouth 2 (two) times daily. Start taking on: October 26, 2023 What changed: These instructions start on October 26, 2023. If you are unsure what to do until then, ask your doctor or other care provider.   ezetimibe 10 MG tablet Commonly known as: ZETIA Take 1 tablet by mouth daily.   FeroSul 325 (65 Fe) MG tablet Generic drug: ferrous sulfate Take 325 mg by mouth every other day.   Multaq 400 MG tablet Generic drug: dronedarone Take 400 mg by mouth 2 (two) times daily. Pt takes at 6pm and 6 am   oxyCODONE 5 MG immediate release tablet Commonly known as: Oxy IR/ROXICODONE Take 1 tablet (5 mg total) by mouth every 4 (four) hours as needed for moderate pain (pain score 4-6). What changed:  medication strength how much to take when to take this reasons to take this   pantoprazole 40 MG tablet Commonly known as: Protonix Take 1 tablet (40 mg total) by mouth 2 (two) times daily.   pregabalin 75 MG capsule Commonly known as: LYRICA Take 75 mg by mouth 3 (three) times daily.   senna 8.6 MG tablet Commonly known as: SENOKOT Take 2-3 tablets by mouth daily as needed for constipation.   tamsulosin 0.4 MG Caps capsule Commonly known as: FLOMAX Take 0.4 mg by mouth at bedtime. 9 pm   testosterone cypionate 200 MG/ML injection Commonly known as: DEPOTESTOSTERONE CYPIONATE Inject 200 mg into the muscle every 21 ( twenty-one) days.   traZODone 50 MG tablet Commonly known as: DESYREL Take 50 mg by mouth at bedtime.       Allergies  Allergen Reactions   Atorvastatin Other (See Comments)    Other reaction(s): Myalgia Takes pravastatin  at home   Rosuvastatin Other (See Comments)    Other reaction(s): Myalgia Takes pravastatin at home      The results of significant diagnostics from this hospitalization (including imaging, microbiology, ancillary and laboratory) are listed below for reference.    Significant Diagnostic Studies: CT ANGIO GI BLEED  Result Date: 10/16/2023 CLINICAL DATA:  Epigastric burning sensation 1 day ago with new lower abdominal pain and bloating associated with 2 episodes of melena. EXAM: CTA ABDOMEN AND PELVIS WITHOUT AND WITH CONTRAST TECHNIQUE: Multidetector CT imaging of the abdomen and pelvis was performed using the standard protocol during bolus administration of intravenous contrast. Multiplanar reconstructed images and MIPs were obtained and reviewed to evaluate the vascular anatomy. RADIATION DOSE REDUCTION: This exam was performed according to the departmental dose-optimization program which includes automated exposure control, adjustment of the mA and/or kV according to patient size and/or use of iterative reconstruction technique. CONTRAST:  OMNIPAQUE IOHEXOL 350 MG/ML SOLN COMPARISON:  CTA abdomen and pelvis dated 05/11/2023 FINDINGS: VASCULAR Aorta: Aortic atherosclerosis. Normal caliber aorta without aneurysm, dissection, vasculitis or significant stenosis. Celiac: Patent without evidence of aneurysm, dissection, vasculitis or significant stenosis. SMA: Patent without evidence of aneurysm, dissection, vasculitis or significant stenosis. Renals: Both renal arteries are patent without evidence of aneurysm, dissection, vasculitis, fibromuscular dysplasia or significant stenosis. IMA: Patent without evidence of aneurysm, dissection, vasculitis or significant stenosis. Inflow: Patent without evidence of aneurysm, dissection, vasculitis or significant stenosis. Proximal Outflow: Bilateral common femoral and visualized portions of the superficial and profunda femoral arteries are patent without  evidence of aneurysm, dissection, vasculitis or significant stenosis. Veins: No obvious venous abnormality within the limitations of this arterial phase study. Review of the MIP images confirms the above findings. NON-VASCULAR Lower chest: No focal consolidation or pulmonary nodule in the lung bases. No pleural effusions. Left atrial enlargement. Coronary artery calcifications. Hepatobiliary: No focal hepatic lesions. No intra or extrahepatic biliary ductal dilation. Cholecystectomy. Pancreas: No focal lesions or main ductal dilation. Spleen: Normal in size without focal abnormality. Adrenals/Urinary Tract: 1.1 cm right adrenal nodule measures -8 HU, likely adenoma. No specific follow-up imaging recommended. No left adrenal nodule. Right upper pole renal cortical scarring. No suspicious renal mass or hydronephrosis. Punctate bilateral nonobstructing stones. Suboptimal evaluation of left bladder wall due to beam hardening artifact from hip arthroplasties. No focal mural thickening of the right bladder. Stomach/Bowel: Normal appearance of the stomach. No evidence of bowel wall thickening, distention, or inflammatory changes. Appendix is not discretely seen. Lymphatic: No enlarged abdominal or pelvic lymph nodes. Reproductive: Prostate is unremarkable. Other: No free fluid, fluid collection, or free air. Musculoskeletal: No acute or abnormal lytic or blastic osseous lesions. Multilevel degenerative changes of the partially imaged thoracic and lumbar spine. Left hip arthroplasty. 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 Surgical changes of the right inguinal region. IMPRESSION: 1. No evidence of active extravasation. 2. No acute abdominopelvic findings. 3. Punctate bilateral nonobstructing stones. 4. Aortic Atherosclerosis (ICD10-I70.0). Coronary artery calcifications. Assessment for potential risk factor modification, dietary therapy or pharmacologic therapy may be warranted, if  clinically indicated. Electronically Signed   By: Agustin Cree M.D.   On: 10/16/2023 20:02    Microbiology: No results found for this or any previous visit (from the past 240 hour(s)).   Labs: Basic Metabolic Panel: Recent Labs  Lab 10/16/23 1452 10/17/23 0353 10/18/23 0417  NA 131* 136 137  K 3.7 4.0 4.1  CL 100 107 107  CO2 21* 21* 23  GLUCOSE 109* 86 162*  BUN 19 14 20   CREATININE 1.15 1.16 1.02  CALCIUM 8.9 8.3* 8.8*  MG  --  2.0  --    Liver Function Tests: Recent Labs  Lab 10/16/23 1452  AST 20  ALT 21  ALKPHOS 53  BILITOT 0.7  PROT 7.9  ALBUMIN 4.4   No results for input(s): "LIPASE", "AMYLASE" in the last 168 hours. No results for input(s): "AMMONIA" in the last 168 hours. CBC: Recent Labs  Lab 10/16/23 1452 10/16/23 2223 10/17/23 0353 10/17/23 1035 10/17/23 1600 10/17/23 2104 10/18/23 0417  WBC 7.0  --   --   --   --   --  4.8  NEUTROABS  --   --   --   --   --   --  3.2  HGB 14.3   < > 12.0* 12.0* 13.0 12.6* 12.5*  HCT 44.9   < > 36.9* 37.5* 41.5 39.3 38.8*  MCV  95.3  --   --   --   --   --  94.9  PLT 203  --   --   --   --   --  176   < > = values in this interval not displayed.   Cardiac Enzymes: No results for input(s): "CKTOTAL", "CKMB", "CKMBINDEX", "TROPONINI" in the last 168 hours. BNP: BNP (last 3 results) Recent Labs    05/11/23 1835  BNP 121.9*    ProBNP (last 3 results) No results for input(s): "PROBNP" in the last 8760 hours.  CBG: Recent Labs  Lab 10/16/23 2210 10/16/23 2354 10/17/23 0418 10/17/23 0750 10/17/23 1123  GLUCAP 114* 104* 87 88 83       Signed:  Rhetta Mura MD   [facility] 10/18/2023, 8:50 AM

## 2023-10-18 NOTE — Plan of Care (Signed)

## 2023-10-22 ENCOUNTER — Encounter (HOSPITAL_COMMUNITY): Payer: Self-pay | Admitting: Gastroenterology

## 2023-10-29 ENCOUNTER — Telehealth: Payer: Self-pay | Admitting: Cardiology

## 2023-10-29 IMAGING — CT CT RENAL STONE PROTOCOL
2 of 4 series · 16 of 46 positions shown, 18 images · non-contrast
Comparison: 11/28/2021

CLINICAL DATA: Flank pain.  Evaluate for kidney stone.



[Series 2: axial st · axial · 0.87mm/px · z∈[-484,-34]mm · 13 of 104 slices shown, 15 images]
[im 7/104  soft-tissue]
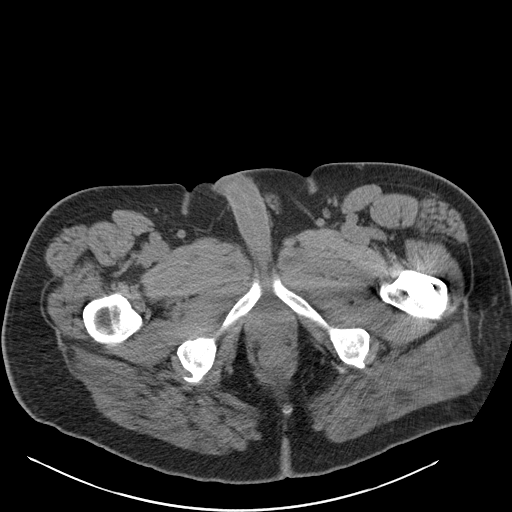
[im 7/104  bone]
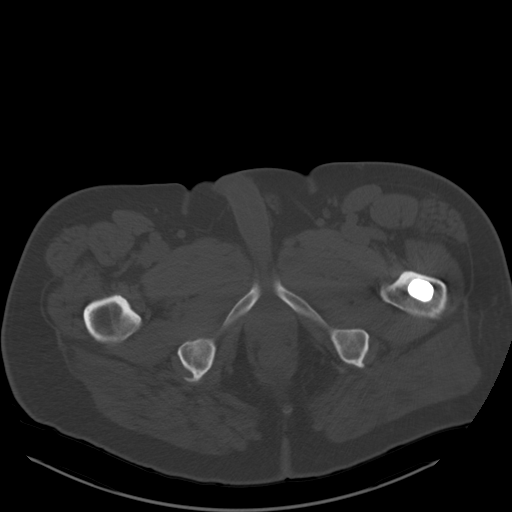
[im 13/104  soft-tissue]
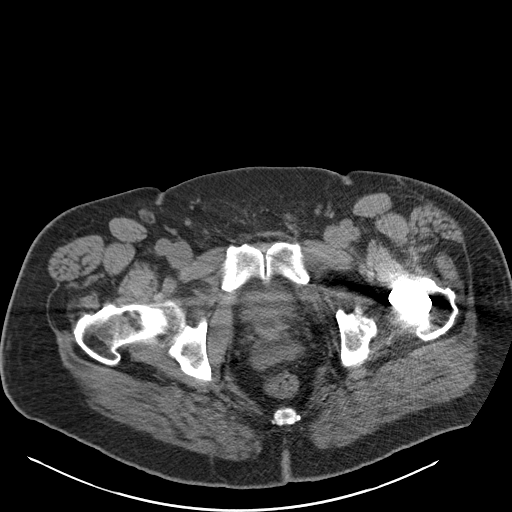
[im 20/104  soft-tissue]
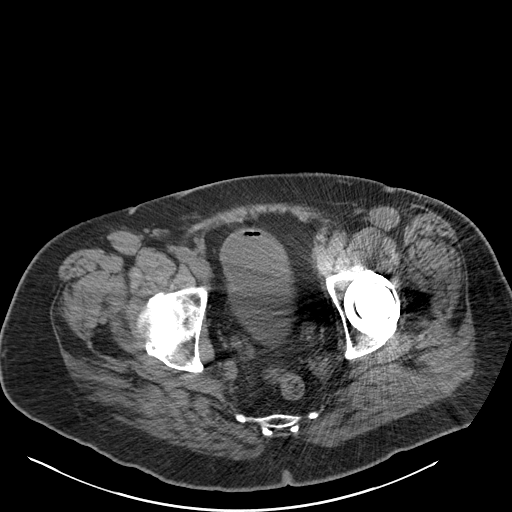
[im 33/104  soft-tissue]
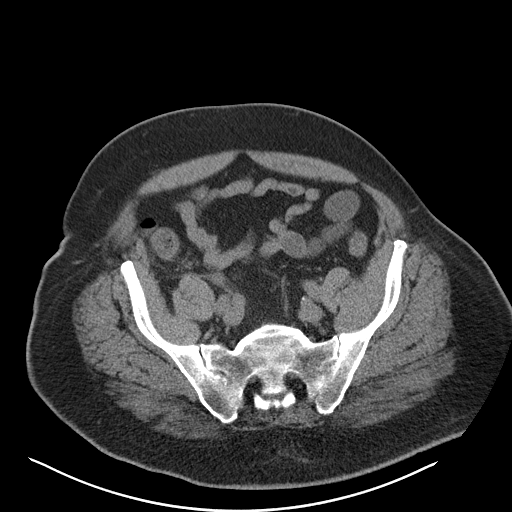
[im 39/104  soft-tissue]
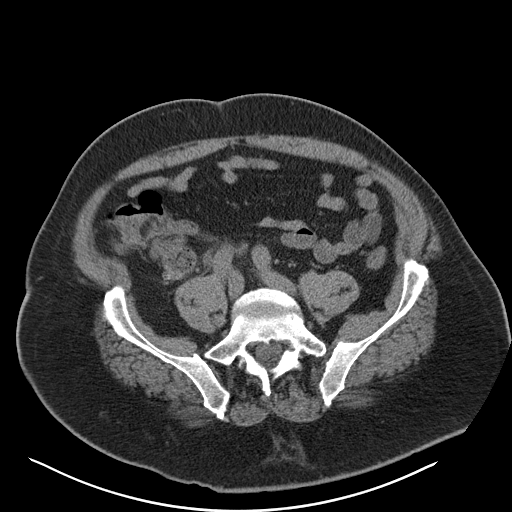
[im 46/104  soft-tissue]
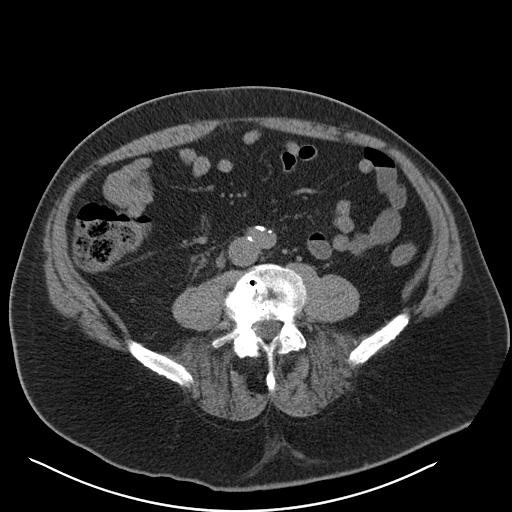
[im 52/104  soft-tissue]
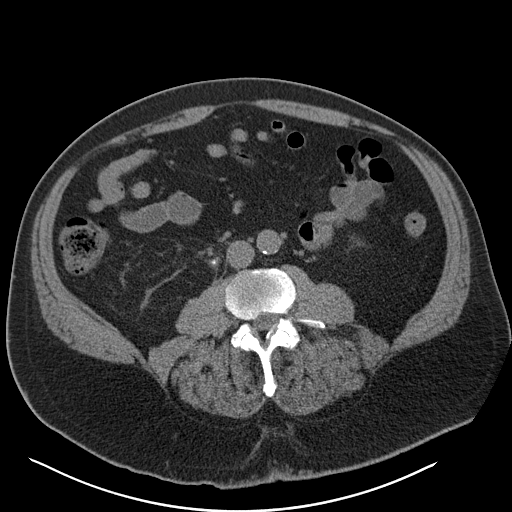
[im 58/104  soft-tissue]
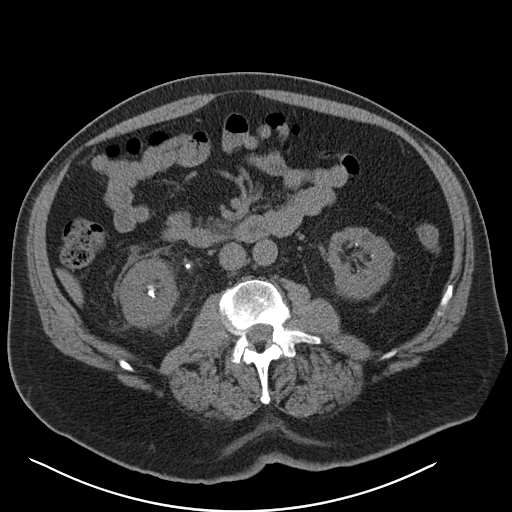
[im 65/104  soft-tissue]
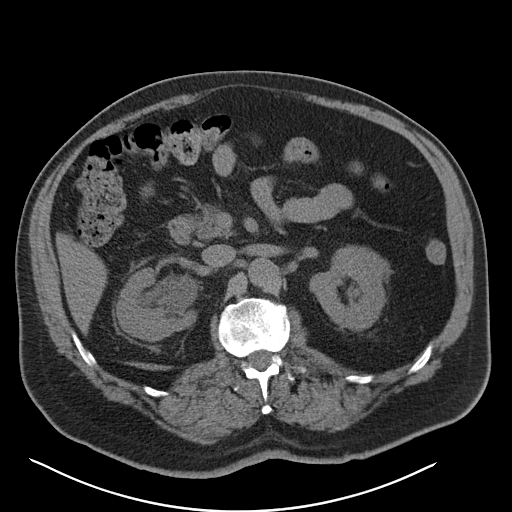
[im 65/104  bone]
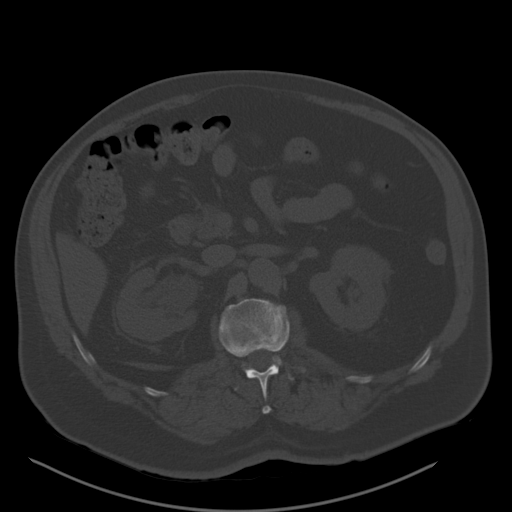
[im 71/104  soft-tissue]
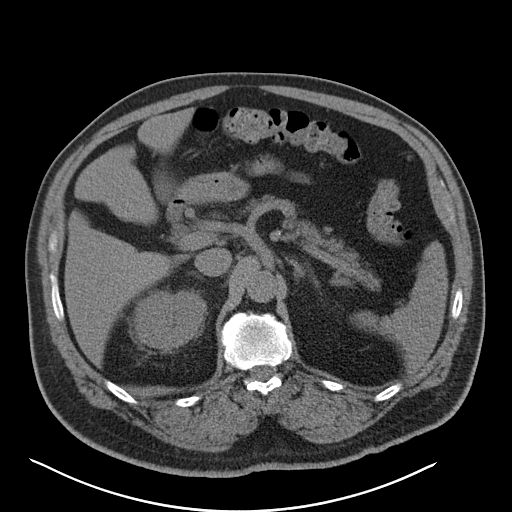
[im 84/104  soft-tissue]
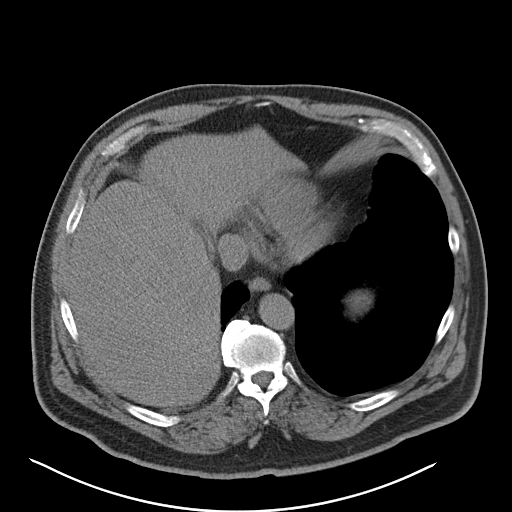
[im 91/104  soft-tissue]
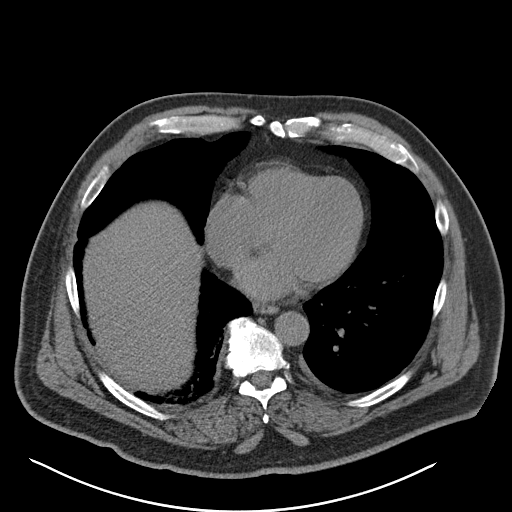
[im 97/104  soft-tissue]
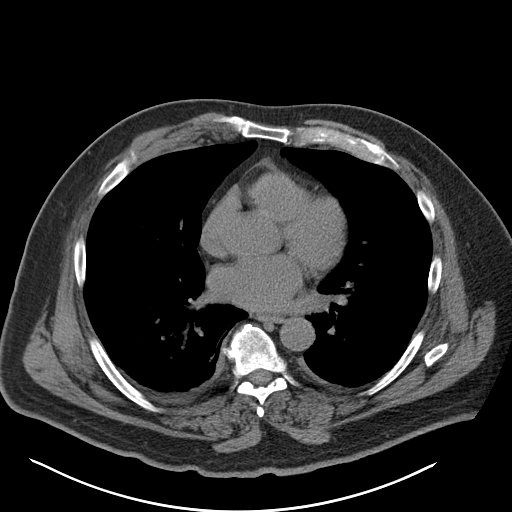

[Series 4: coronal · coronal · 0.87mm/px · 3 of 179 slices shown]
[im 60/179  soft-tissue]
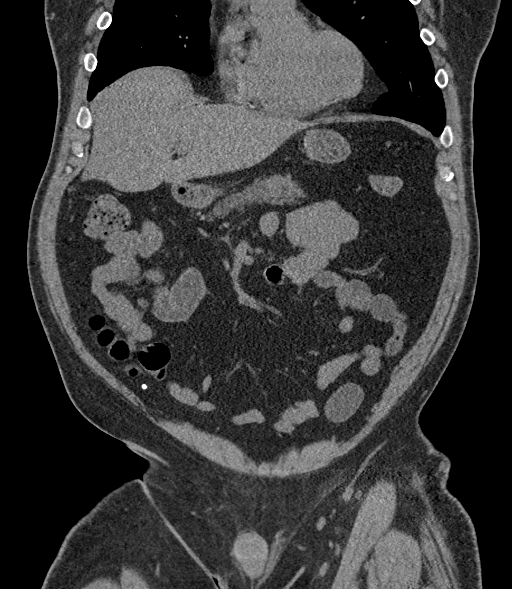
[im 80/179  soft-tissue]
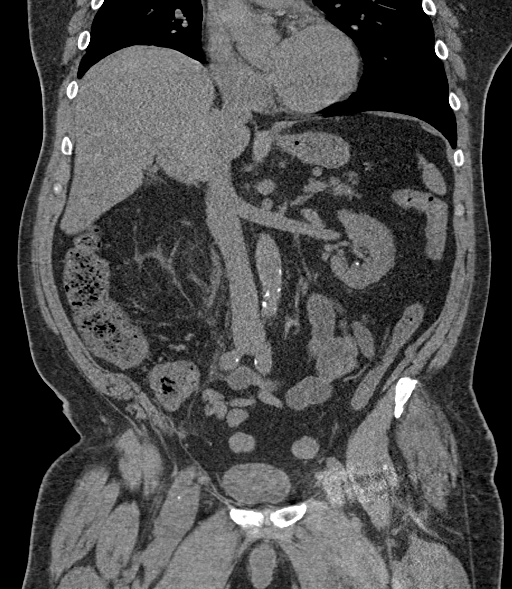
[im 99/179  soft-tissue]
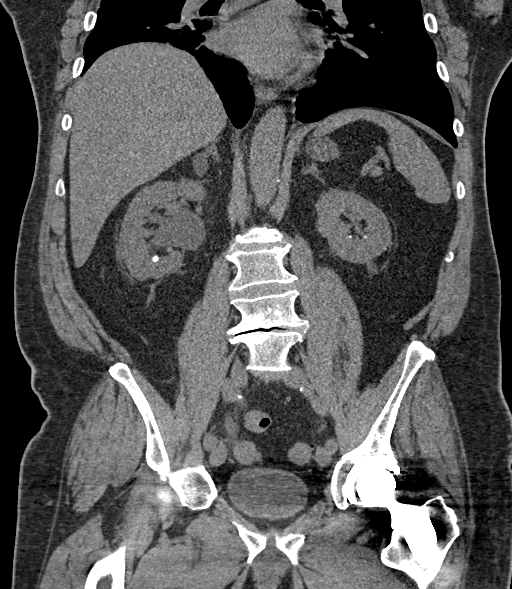

[16 of 46 positions shown; findings below may reference images not displayed]

FINDINGS: Lower chest: Small right pleural effusion identified.

Hepatobiliary: No focal liver abnormality. Status post
cholecystectomy. No bile duct dilatation.

Pancreas: Unremarkable. No pancreatic ductal dilatation or
surrounding inflammatory changes.

Spleen: Normal in size without focal abnormality.

Adrenals/Urinary Tract: Normal adrenal glands.

Bilateral nephrolithiasis is again identified. The largest stone is
in the lower pole collecting system of the right kidney measuring 9
mm, image 98/4. There is right-sided perinephric fat stranding and
hydronephrosis. Multiple tiny stacked stone fragments are identified
along the course of the proximal right ureter from the UPJ to the
level of the L3-4 disc space. Stone fragments or conglomeration of
stone fragments measure up to 4 mm, image 48/2. There is no
left-sided hydronephrosis, hydroureter or ureteral calculi.

Bladder is unremarkable.

Stomach/Bowel: Stomach is within normal limits. No evidence of bowel
wall thickening, distention, or inflammatory changes.

Vascular/Lymphatic: Aortic atherosclerosis. No enlarged abdominal or
pelvic lymph nodes.

Reproductive: Prostate is unremarkable.

Other: No ascites or focal fluid collections.

Musculoskeletal: Previous left hip arthroplasty.Degenerative disc
disease identified within the thoracolumbar spine.
IMPRESSION: 1. Right-sided hydronephrosis and perinephric fat stranding with
multiple tiny stacked stone fragments along the course of the
proximal right ureter.
2. Bilateral nephrolithiasis.
3. Small right pleural effusion.
4. Aortic Atherosclerosis ([id number]).

## 2023-10-29 NOTE — Telephone Encounter (Signed)
Patient is calling to schedule procedure with Roanna Raider, Charity fundraiser. Requesting call back.

## 2023-11-01 NOTE — Telephone Encounter (Signed)
Spoke to [employer] - She will contact patient

## 2023-11-01 NOTE — Telephone Encounter (Signed)
Left message that I would call him tommorrow

## 2023-11-01 NOTE — Telephone Encounter (Signed)
  Pt is calling back to follow up. He is ready to schedule his ablation

## 2023-11-02 NOTE — Telephone Encounter (Signed)
Pt aware we will call to schedule ablation.  Aware it may be after 1st of year before hearing from [place].  Patient agreeable to plan.

## 2023-11-20 ENCOUNTER — Telehealth: Payer: Self-pay | Admitting: Cardiovascular Disease

## 2023-11-20 ENCOUNTER — Other Ambulatory Visit: Payer: Self-pay | Admitting: Urology

## 2023-11-20 NOTE — Telephone Encounter (Signed)
   Pre-operative Risk Assessment    Patient Name: Parker Daniel  DOB: Aug 26, 1956 MRN: [id number]      Request for Surgical Clearance    Procedure:   TURP  Date of Surgery:  Clearance 12/25/23                                 Surgeon: Dr. Nieves Surgeon's Group or Practice Name: [facility] Phone number: [phone] Fax number: [phone]   Type of Clearance Requested:   - Medical  - Pharmacy:  Hold Apixaban  (Eliquis ) 2 days    Type of Anesthesia:  General    Additional requests/questions:    [name]   11/20/2023, 8:35 AM

## 2023-11-20 NOTE — Telephone Encounter (Signed)
 Pt aware I spoke to [facility] about his TURP procedure in February and how long he will need to hold [facility] post procedure. Pt scheduled for afib ablation 02/15/24.  Aware he will need to see Dr. Inocencio prior to and that scheduler will contact him to arrange an OV w/ MD  (ok to schedule him at next available, but not urgent.  Ensure appt is prior to 01/15/24 please). Informed that I would follow up week/two after TURP to confirm he has restarted Eliquis . Aware will go over procedure instructions at later date or possibly his OV. Patient verbalized understanding and agreeable to plan.

## 2023-11-24 NOTE — Telephone Encounter (Signed)
 Patient with diagnosis of atrial fibrillation on Eliquis  for anticoagulation.    Procedure: TURP Date of procedure: 12/25/23   CHA2DS2-VASc Score = 4   This indicates a 4.8% annual risk of stroke. The patient's score is based upon: CHF History: 0 HTN History: 1 Diabetes History: 1 Stroke History: 0 Vascular Disease History: 1 Age Score: 1 Gender Score: 0  CrCl 110 Platelet count 176  Per office protocol, patient can hold Eliquis  for 2 days prior to procedure.   Patient will not need bridging with Lovenox (enoxaparin) around procedure.  **This guidance is not considered finalized until pre-operative APP has relayed final recommendations.**

## 2023-11-26 NOTE — Telephone Encounter (Signed)
 I d/w Dr. Girard Cooter who states he will be happy to see the pt for preop clearance as well. I will update the appt notes need preop clearance.

## 2023-11-26 NOTE — Telephone Encounter (Signed)
   Name: Parker Daniel  DOB: 06-18-1956  MRN: [id number]  Primary Cardiologist: Darryle ONEIDA Decent, MD   Preoperative team, please contact this patient and set up a phone call appointment for further preoperative risk assessment. Please obtain consent and complete medication review. Thank you for your help.  I confirm that guidance regarding antiplatelet and oral anticoagulation therapy has been completed and, if necessary, noted below.  Patient with diagnosis of atrial fibrillation on Eliquis  for anticoagulation.     Procedure: TURP Date of procedure: 12/25/23     CHA2DS2-VASc Score = 4   This indicates a 4.8% annual risk of stroke. The patient's score is based upon: CHF History: 0 HTN History: 1 Diabetes History: 1 Stroke History: 0 Vascular Disease History: 1 Age Score: 1 Gender Score: 0   CrCl 110 Platelet count 176   Per office protocol, patient can hold Eliquis  for 2 days prior to procedure.   Patient will not need bridging with Lovenox (enoxaparin) around procedure.  I also confirmed the patient resides in the state of [place] . As per [facility] telemedicine laws, the patient must reside in the state in which the provider is licensed.   Parker Satterfield, NP 11/26/2023, 8:21 AM [facility]

## 2023-12-06 NOTE — Progress Notes (Signed)
Anesthesia Review:  PCP: Cardiologist : Chest x-ray : EKG : 10/19/23    EP Study - 06/11/23  Echo : Stress test: Cardiac Cath :  Activity level:  Sleep Study/ CPAP : Fasting Blood Sugar :      / Checks Blood Sugar -- times a day:   Blood Thinner/ Instructions /Last Dose: ASA / Instructions/ Last Dose :    [place]-    DM- type Hgba1c-    02/15/24 scheduled for Afib ablaton    12/11/23- appt with Will Camnitz at 1015am  10/17/23- EGD

## 2023-12-06 NOTE — Patient Instructions (Signed)
SURGICAL WAITING ROOM VISITATION  Patients having surgery or a procedure may have no more than 2 support people in the waiting area - these visitors may rotate.    Children under the age of 61 must have an adult with them who is not the patient.  Due to an increase in RSV and influenza rates and associated hospitalizations, children ages 34 and under may not visit patients in [facility] hospitals.  Visitors with respiratory illnesses are discouraged from visiting and should remain at home.  If the patient needs to stay at the hospital during part of their recovery, the visitor guidelines for inpatient rooms apply. Pre-op nurse will coordinate an appropriate time for 1 support person to accompany patient in pre-op.  This support person may not rotate.    Please refer to the [facility] website for the visitor guidelines for Inpatients (after your surgery is over and you are in a regular room).       Your procedure is scheduled on:  12/25/23    Report to [facility] Main Entrance    Report to admitting at   1030AM   Call this number if you have problems the morning of surgery [phone]   Do not eat food or drink liquids  :After Midnight.               If you have questions, please contact your surgeon's office.      Oral Hygiene is also important to reduce your risk of infection.                                    Remember - BRUSH YOUR TEETH THE MORNING OF SURGERY WITH YOUR REGULAR TOOTHPASTE  DENTURES WILL BE REMOVED PRIOR TO SURGERY PLEASE DO NOT APPLY "Poly grip" OR ADHESIVES!!!   Do NOT smoke after Midnight   Stop all vitamins and herbal supplements 7 days before surgery.   Take these medicines the morning of surgery with A SIP OF WATER:  allopurinol, multaq, protonix, lyrica flomax   DO NOT TAKE ANY ORAL DIABETIC MEDICATIONS DAY OF YOUR SURGERY  Bring CPAP mask and tubing day of surgery.                              You may not have any metal on your  body including hair pins, jewelry, and body piercing             Do not wear make-up, lotions, powders, perfumes/cologne, or deodorant  Do not wear nail polish including gel and S&S, artificial/acrylic nails, or any other type of covering on natural nails including finger and toenails. If you have artificial nails, gel coating, etc. that needs to be removed by a nail salon please have this removed prior to surgery or surgery may need to be canceled/ delayed if the surgeon/ anesthesia feels like they are unable to be safely monitored.   Do not shave  48 hours prior to surgery.               Men may shave face and neck.   Do not bring valuables to the hospital. [facility] IS NOT             RESPONSIBLE   FOR VALUABLES.   Contacts, glasses, dentures or bridgework may not be worn into surgery.   Bring small overnight bag  day of surgery.   DO NOT BRING YOUR HOME MEDICATIONS TO THE HOSPITAL. PHARMACY WILL DISPENSE MEDICATIONS LISTED ON YOUR MEDICATION LIST TO YOU DURING YOUR ADMISSION IN THE HOSPITAL!    Patients discharged on the day of surgery will not be allowed to drive home.  Someone NEEDS to stay with you for the first 24 hours after anesthesia.   Special Instructions: Bring a copy of your healthcare power of attorney and living will documents the day of surgery if you haven't scanned them before.              Please read over the following fact sheets you were given: IF YOU HAVE QUESTIONS ABOUT YOUR PRE-OP INSTRUCTIONS PLEASE CALL [phone]   If you received a COVID test during your pre-op visit  it is requested that you wear a mask when out in public, stay away from anyone that may not be feeling well and notify your surgeon if you develop symptoms. If you test positive for Covid or have been in contact with anyone that has tested positive in the last 10 days please notify you surgeon.    [place] - Preparing for Surgery Before surgery, you can play an important role.  Because  skin is not sterile, your skin needs to be as free of germs as possible.  You can reduce the number of germs on your skin by washing with CHG (chlorahexidine gluconate) soap before surgery.  CHG is an antiseptic cleaner which kills germs and bonds with the skin to continue killing germs even after washing. Please DO NOT use if you have an allergy to CHG or antibacterial soaps.  If your skin becomes reddened/irritated stop using the CHG and inform your nurse when you arrive at Short Stay. Do not shave (including legs and underarms) for at least 48 hours prior to the first CHG shower.  You may shave your face/neck. Please follow these instructions carefully:  1.  Shower with CHG Soap the night before surgery and the  morning of Surgery.  2.  If you choose to wash your hair, wash your hair first as usual with your  normal  shampoo.  3.  After you shampoo, rinse your hair and body thoroughly to remove the  shampoo.                           4.  Use CHG as you would any other liquid soap.  You can apply chg directly  to the skin and wash                       Gently with a scrungie or clean washcloth.  5.  Apply the CHG Soap to your body ONLY FROM THE NECK DOWN.   Do not use on face/ open                           Wound or open sores. Avoid contact with eyes, ears mouth and genitals (private parts).                       Wash face,  Genitals (private parts) with your normal soap.             6.  Wash thoroughly, paying special attention to the area where your surgery  will be performed.  7.  Thoroughly rinse your body with warm water from  the neck down.  8.  DO NOT shower/wash with your normal soap after using and rinsing off  the CHG Soap.                9.  Pat yourself dry with a clean towel.            10.  Wear clean pajamas.            11.  Place clean sheets on your bed the night of your first shower and do not  sleep with pets. Day of Surgery : Do not apply any lotions/deodorants the morning of  surgery.  Please wear clean clothes to the hospital/surgery center.  FAILURE TO FOLLOW THESE INSTRUCTIONS MAY RESULT IN THE CANCELLATION OF YOUR SURGERY PATIENT SIGNATURE_________________________________  NURSE SIGNATURE__________________________________  ________________________________________________________________________

## 2023-12-11 ENCOUNTER — Ambulatory Visit: Payer: Medicare HMO | Attending: Cardiology | Admitting: Cardiology

## 2023-12-11 ENCOUNTER — Encounter: Payer: Self-pay | Admitting: Cardiology

## 2023-12-11 ENCOUNTER — Other Ambulatory Visit: Payer: Self-pay

## 2023-12-11 VITALS — BP 140/86 | HR 84 | Ht 70.0 in | Wt 233.0 lb

## 2023-12-11 DIAGNOSIS — D6869 Other thrombophilia: Secondary | ICD-10-CM

## 2023-12-11 DIAGNOSIS — I4819 Other persistent atrial fibrillation: Secondary | ICD-10-CM | POA: Diagnosis not present

## 2023-12-11 NOTE — Progress Notes (Signed)
  Electrophysiology Office Note:   Date:  12/11/2023  ID:  Parker Daniel, DOB 04/23/1956, MRN [id number]  Primary Cardiologist: Reatha Harps, MD Primary Heart Failure: None Electrophysiologist: Calyx Hawker Jorja Loa, MD      History of Present Illness:   Parker Daniel is a 68 y.o. male with h/o atrial fibrillation, hypertension, diabetes seen today for routine electrophysiology followup.   Since last being seen in our clinic the patient reports doing overall well.  He has continued to have episodes of atrial fibrillation, but has no acute complaints today.  He is ready for his ablation.  he denies chest pain, palpitations, dyspnea, PND, orthopnea, nausea, vomiting, dizziness, syncope, edema, weight gain, or early satiety.   Review of systems complete and found to be negative unless listed in HPI.   EP Information / Studies Reviewed:    EKG is not ordered today. EKG from 11/12/2023 reviewed which showed atrial fibrillation        Risk Assessment/Calculations:    CHA2DS2-VASc Score = 4   This indicates a 4.8% annual risk of stroke. The patient's score is based upon: CHF History: 0 HTN History: 1 Diabetes History: 1 Stroke History: 0 Vascular Disease History: 1 Age Score: 1 Gender Score: 0            Physical Exam:   VS:  BP (!) 140/86 (BP Location: Right Arm, Patient Position: Sitting, Cuff Size: Large)   Pulse 84   Ht 5\' 10"  (1.778 m)   Wt 233 lb (105.7 kg)   SpO2 97%   BMI 33.43 kg/m    Wt Readings from Last 3 Encounters:  12/11/23 233 lb (105.7 kg)  10/18/23 220 lb 7.4 oz (100 kg)  07/31/23 236 lb 12.8 oz (107.4 kg)     GEN: Well nourished, well developed in no acute distress NECK: No JVD; No carotid bruits CARDIAC: Regular rate and rhythm, no murmurs, rubs, gallops RESPIRATORY:  Clear to auscultation without rales, wheezing or rhonchi  ABDOMEN: Soft, non-tender, non-distended EXTREMITIES:  No edema; No deformity   ASSESSMENT AND PLAN:    1.  Persistent  atrial fibrillation: He is continue to have episodes of atrial fibrillation.  He feels fatigued and short of breath.  He would benefit from continued rhythm control.  Samariah Hokenson plan for ablation.  Risk and benefits have been discussed.  He understands the risks and is agreed to the procedure.  In the interim, we Aquila Menzie continue his Multaq.  Risk, benefits, and alternatives to EP study and radiofrequency/pulse field ablation for afib were also discussed in detail today. These risks include but are not limited to stroke, bleeding, vascular damage, tamponade, perforation, damage to the esophagus, lungs, and other structures, pulmonary vein stenosis, worsening renal function, and death. The patient understands these risk and wishes to proceed.  We Kaitlyn Franko therefore proceed with catheter ablation at the next available time.  Carto, ICE, anesthesia are requested for the procedure.  Charise Leinbach also obtain CT PV protocol prior to the procedure to exclude LAA thrombus and further evaluate atrial anatomy.  2.  Nonobstructive coronary artery disease: No current chest pain.  Plan per primary cardiology.  3.  Secondary hypercoagulable state: Currently on Eliquis for atrial fibrillation  Follow up with Dr. Elberta Fortis as usual post procedure  Signed, Belinda Bringhurst Jorja Loa, MD

## 2023-12-11 NOTE — Patient Instructions (Signed)
Medication Instructions:  Your physician recommends that you continue on your current medications as directed. Please refer to the Current Medication list given to you today.  *If you need a refill on your cardiac medications before your next appointment, please call your pharmacy*   Lab Work: Pre procedure labs -- we will call you to schedule:  BMP & CBC  If you have a lab test that is abnormal and we need to change your treatment, we will call you to review the results -- otherwise no news is good news.    Testing/Procedures: Your physician has requested that you have cardiac CT 1 month PRIOR to your ablation. Cardiac computed tomography (CT) is a painless test that uses an x-ray machine to take clear, detailed pictures of your heart. We will contact you if the result is abnormal. We will call you to schedule.  Your physician has recommended that you have an ablation. Catheter ablation is a medical procedure used to treat some cardiac arrhythmias (irregular heartbeats). During catheter ablation, a long, thin, flexible tube is put into a blood vessel in your groin (upper thigh), or neck. This tube is called an ablation catheter. It is then guided to your heart through the blood vessel. Radio frequency waves destroy small areas of heart tissue where abnormal heartbeats may cause an arrhythmia to start.   Your ablation is scheduled for 02/15/2024. Please arrive at [facility] at 5:30 am.  We will call you for further instructions.   Follow-Up: At [facility], you and your health needs are our priority.  As part of our continuing mission to provide you with exceptional heart care, we have created designated Provider Care Teams.  These Care Teams include your primary Cardiologist (physician) and Advanced Practice Providers (APPs -  Physician Assistants and Nurse Practitioners) who all work together to provide you with the care you need, when you need it.  Your next appointment:   1  month(s) after your ablation  The format for your next appointment:   In Person  Provider:   AFib clinic   Thank you for choosing [facility]!!   Dory Horn, RN [phone]    Other Instructions   Cardiac Ablation Cardiac ablation is a procedure to destroy (ablate) some heart tissue that is sending bad signals. These bad signals cause problems in heart rhythm. The heart has many areas that make these signals. If there are problems in these areas, they can make the heart beat in a way that is not normal. Destroying some tissues can help make the heart rhythm normal. Tell your doctor about: Any allergies you have. All medicines you are taking. These include vitamins, herbs, eye drops, creams, and over-the-counter medicines. Any problems you or family members have had with medicines that make you fall asleep (anesthetics). Any blood disorders you have. Any surgeries you have had. Any medical conditions you have, such as kidney failure. Whether you are pregnant or may be pregnant. What are the risks? This is a safe procedure. But problems may occur, including: Infection. Bruising and bleeding. Bleeding into the chest. Stroke or blood clots. Damage to nearby areas of your body. Allergies to medicines or dyes. The need for a pacemaker if the normal system is damaged. Failure of the procedure to treat the problem. What happens before the procedure? Medicines Ask your doctor about: Changing or stopping your normal medicines. This is important. Taking aspirin and ibuprofen. Do not take these medicines unless your doctor tells you to  take them. Taking other medicines, vitamins, herbs, and supplements. General instructions Follow instructions from your doctor about what you cannot eat or drink. Plan to have someone take you home from the hospital or clinic. If you will be going home right after the procedure, plan to have someone with you for 24 hours. Ask your doctor  what steps will be taken to prevent infection. What happens during the procedure?  An IV tube will be put into one of your veins. You will be given a medicine to help you relax. The skin on your neck or groin will be numbed. A cut (incision) will be made in your neck or groin. A needle will be put through your cut and into a large vein. A tube (catheter) will be put into the needle. The tube will be moved to your heart. Dye may be put through the tube. This helps your doctor see your heart. Small devices (electrodes) on the tube will send out signals. A type of energy will be used to destroy some heart tissue. The tube will be taken out. Pressure will be held on your cut. This helps stop bleeding. A bandage will be put over your cut. The exact procedure may vary among doctors and hospitals. What happens after the procedure? You will be watched until you leave the hospital or clinic. This includes checking your heart rate, breathing rate, oxygen, and blood pressure. Your cut will be watched for bleeding. You will need to lie still for a few hours. Do not drive for 24 hours or as long as your doctor tells you. Summary Cardiac ablation is a procedure to destroy some heart tissue. This is done to treat heart rhythm problems. Tell your doctor about any medical conditions you may have. Tell him or her about all medicines you are taking to treat them. This is a safe procedure. But problems may occur. These include infection, bruising, bleeding, and damage to nearby areas of your body. Follow what your doctor tells you about food and drink. You may also be told to change or stop some of your medicines. After the procedure, do not drive for 24 hours or as long as your doctor tells you. This information is not intended to replace advice given to you by your health care provider. Make sure you discuss any questions you have with your health care provider. Document Revised: 01/20/2022 Document  Reviewed: 10/02/2019 Elsevier Patient Education  2023 [employer]   Cardiac Ablation, Care After  This sheet gives you information about how to care for yourself after your procedure. Your health care provider may also give you more specific instructions. If you have problems or questions, contact your health care provider. What can I expect after the procedure? After the procedure, it is common to have: Bruising around your puncture site. Tenderness around your puncture site. Skipped heartbeats. If you had an atrial fibrillation ablation, you may have atrial fibrillation during the first several months after your procedure.  Tiredness (fatigue).  Follow these instructions at home: Puncture site care  Follow instructions from your health care provider about how to take care of your puncture site. Make sure you: If present, leave stitches (sutures), skin glue, or adhesive strips in place. These skin closures may need to stay in place for up to 2 weeks. If adhesive strip edges start to loosen and curl up, you may trim the loose edges. Do not remove adhesive strips completely unless your health care provider tells you to do that.  If a large square bandage is present, this may be removed 24 hours after surgery.  Check your puncture site every day for signs of infection. Check for: Redness, swelling, or pain. Fluid or blood. If your puncture site starts to bleed, lie down on your back, apply firm pressure to the area, and contact your health care provider. Warmth. Pus or a bad smell. A pea or small marble sized lump at the site is normal and can take up to three months to resolve.  Driving Do not drive for at least 4 days after your procedure or however long your health care provider recommends. (Do not resume driving if you have previously been instructed not to drive for other health reasons.) Do not drive or use heavy machinery while taking prescription pain medicine. Activity Avoid  activities that take a lot of effort for at least 7 days after your procedure. Do not lift anything that is heavier than 5 lb (4.5 kg) for one week.  No sexual activity for 1 week.  Return to your normal activities as told by your health care provider. Ask your health care provider what activities are safe for you. General instructions Take over-the-counter and prescription medicines only as told by your health care provider. Do not use any products that contain nicotine or tobacco, such as cigarettes and e-cigarettes. If you need help quitting, ask your health care provider. You may shower after 24 hours, but Do not take baths, swim, or use a hot tub for 1 week.  Do not drink alcohol for 24 hours after your procedure. Keep all follow-up visits as told by your health care provider. This is important. Contact a health care provider if: You have redness, mild swelling, or pain around your puncture site. You have fluid or blood coming from your puncture site that stops after applying firm pressure to the area. Your puncture site feels warm to the touch. You have pus or a bad smell coming from your puncture site. You have a fever. You have chest pain or discomfort that spreads to your neck, jaw, or arm. You have chest pain that is worse with lying on your back or taking a deep breath. You are sweating a lot. You feel nauseous. You have a fast or irregular heartbeat. You have shortness of breath. You are dizzy or light-headed and feel the need to lie down. You have pain or numbness in the arm or leg closest to your puncture site. Get help right away if: Your puncture site suddenly swells. Your puncture site is bleeding and the bleeding does not stop after applying firm pressure to the area. These symptoms may represent a serious problem that is an emergency. Do not wait to see if the symptoms will go away. Get medical help right away. Call your local emergency services (911 in the [place] Do not  drive yourself to the hospital. Summary After the procedure, it is normal to have bruising and tenderness at the puncture site in your groin, neck, or forearm. Check your puncture site every day for signs of infection. Get help right away if your puncture site is bleeding and the bleeding does not stop after applying firm pressure to the area. This is a medical emergency. This information is not intended to replace advice given to you by your health care provider. Make sure you discuss any questions you have with your health care provider.

## 2023-12-12 ENCOUNTER — Encounter (HOSPITAL_COMMUNITY): Payer: Self-pay

## 2023-12-12 ENCOUNTER — Encounter (HOSPITAL_COMMUNITY)
Admission: RE | Admit: 2023-12-12 | Discharge: 2023-12-12 | Disposition: A | Discharge status: Home / Self Care | Payer: Medicare HMO | Source: Ambulatory Visit | Attending: Urology | Admitting: Urology

## 2023-12-12 ENCOUNTER — Other Ambulatory Visit: Payer: Self-pay

## 2023-12-12 VITALS — BP 139/82 | HR 84 | Temp 98.7°F | Ht 70.0 in | Wt 231.0 lb

## 2023-12-12 DIAGNOSIS — E119 Type 2 diabetes mellitus without complications: Secondary | ICD-10-CM | POA: Insufficient documentation

## 2023-12-12 DIAGNOSIS — I4891 Unspecified atrial fibrillation: Secondary | ICD-10-CM | POA: Insufficient documentation

## 2023-12-12 DIAGNOSIS — Z01812 Encounter for preprocedural laboratory examination: Secondary | ICD-10-CM | POA: Diagnosis present

## 2023-12-12 HISTORY — DX: Cardiac arrhythmia, unspecified: I49.9

## 2023-12-12 LAB — CBC
HCT: 40.6 % (ref 39.0–52.0)
Hemoglobin: 12.5 g/dL — ABNORMAL LOW (ref 13.0–17.0)
MCH: 26.1 pg (ref 26.0–34.0)
MCHC: 30.8 g/dL (ref 30.0–36.0)
MCV: 84.8 fL (ref 80.0–100.0)
Platelets: 213 10*3/uL (ref 150–400)
RBC: 4.79 MIL/uL (ref 4.22–5.81)
RDW: 15.2 % (ref 11.5–15.5)
WBC: 8.4 10*3/uL (ref 4.0–10.5)
nRBC: 0 % (ref 0.0–0.2)

## 2023-12-12 LAB — BASIC METABOLIC PANEL
Anion gap: 7 (ref 5–15)
BUN: 15 mg/dL (ref 8–23)
CO2: 23 mmol/L (ref 22–32)
Calcium: 9.2 mg/dL (ref 8.9–10.3)
Chloride: 106 mmol/L (ref 98–111)
Creatinine, Ser: 1.09 mg/dL (ref 0.61–1.24)
GFR, Estimated: >60 mL/min (ref >60)
Glucose, Bld: 80 mg/dL (ref 70–99)
Potassium: 3.9 mmol/L (ref 3.5–5.1)
Sodium: 136 mmol/L (ref 135–145)

## 2023-12-12 LAB — GLUCOSE, CAPILLARY: Glucose-Capillary: 81 mg/dL (ref 70–99)

## 2023-12-12 NOTE — Progress Notes (Addendum)
Sande Rives, MD :Cardiology  Echo: 01/02/23 CT coronary: 12/23/22  Eliquis will be hold after: 12/22/23

## 2023-12-13 NOTE — Progress Notes (Incomplete)
Anesthesia Chart Review   Case: [id number] Date/Time: 12/25/23 1215   Procedure: TRANSURETHRAL RESECTION OF THE PROSTATE (TURP) - 60 MINUTE CASE   Anesthesia type: General   Pre-op diagnosis: BENIGN PROSTATIC HYPERPLASIA   Location: [facility] / [facility]   Surgeons: Jerilee Field, MD       DISCUSSION:67 y.o. never smoker with h/o HTN, atrial fibrillation, DM II, nonobstructive CAD, BPH scheduled for above procedure 12/25/2023 with Dr. Jerilee Field.    VS: BP 139/82   Pulse 84   Temp 37.1 C (Oral)   Ht 5\' 10"  (1.778 m)   Wt 104.8 kg   SpO2 97%   BMI 33.15 kg/m   PROVIDERS: System, Provider Not In   LABS: Labs reviewed: Acceptable for surgery. (all labs ordered are listed, but only abnormal results are displayed)  Labs Reviewed  CBC - Abnormal; Notable for the following components:      Result Value   Hemoglobin 12.5 (*)    All other components within normal limits  BASIC METABOLIC PANEL  GLUCOSE, CAPILLARY     IMAGES:   EKG:   CV: Echo 01/02/2023 1. Left ventricular ejection fraction, by estimation, is 60 to 65%. The  left ventricle has normal function. The left ventricle has no regional  wall motion abnormalities. Left ventricular diastolic parameters were  normal.   2. Right ventricular systolic function is normal. The right ventricular  size is normal.   3. The mitral valve is normal in structure. No evidence of mitral valve  regurgitation. No evidence of mitral stenosis.   4. The aortic valve is normal in structure. Aortic valve regurgitation is  not visualized. No aortic stenosis is present.   5. The inferior vena cava is normal in size with greater than 50%  respiratory variability, suggesting right atrial pressure of 3 mmHg.  Past Medical History:  Diagnosis Date   Arthritis    Coronary artery disease    Diabetes mellitus without complication (HCC)    Dyspnea    with exertion   Dysrhythmia    GERD (gastroesophageal reflux disease)     Gout    History of kidney stones    Hyperlipidemia    Hypertension    Pneumonia    10/2021    Past Surgical History:  Procedure Laterality Date   APPENDECTOMY     age 55   BIOPSY  10/17/2023   Procedure: BIOPSY;  Surgeon: Willis Modena, MD;  Location: [facility];  Service: Gastroenterology;;   CARDIOVERSION N/A 06/11/2023   Procedure: CARDIOVERSION;  Surgeon: Thurmon Fair, MD;  [facility]: [facility];  Service: Cardiovascular;  Laterality: N/A;   CHOLECYSTECTOMY     age 10   CYSTOSCOPY/URETEROSCOPY/HOLMIUM LASER/STENT PLACEMENT Right 01/24/2022   Procedure: CYSTOSCOPY/RETROGRADE/URETEROSCOPY/HOLMIUM LASER/STENT PLACEMENT;  Surgeon: Jerilee Field, MD;  Location: [facility];  Service: Urology;  Laterality: Right;   ESOPHAGOGASTRODUODENOSCOPY (EGD) WITH PROPOFOL Left 10/17/2023   Procedure: ESOPHAGOGASTRODUODENOSCOPY (EGD) WITH PROPOFOL;  Surgeon: Willis Modena, MD;  [facility];  Service: Gastroenterology;  Laterality: Left;   KNEE SURGERY Right    total knee   SURGERY SCROTAL / TESTICULAR     age 33   THULIUM LASER TURP (TRANSURETHRAL RESECTION OF PROSTATE) N/A 01/24/2022   Procedure: THULIUM LASER TURP (TRANSURETHRAL RESECTION OF PROSTATE);  Surgeon: Jerilee Field, MD;  Location[facility];  Service: Urology;  Laterality: N/A;   TONSILLECTOMY     TOTAL HIP ARTHROPLASTY Left 06/27/2019   Procedure: LEFT TOTAL HIP ARTHROPLASTY ANTERIOR APPROACH;  Surgeon:  Kathryne Hitch, MD;  Location[facility];  Service: Orthopedics;  Laterality: Left;   TOTAL KNEE ARTHROPLASTY Right 03/17/2022   Procedure: RIGHT TOTAL KNEE ARTHROPLASTY;  Surgeon: Kathryne Hitch, MD;  [facility];  Service: Orthopedics;  Laterality: Right;    MEDICATIONS:  acetaminophen (TYLENOL) 500 MG tablet   allopurinol (ZYLOPRIM) 300 MG tablet   atorvastatin (LIPITOR) 40 MG tablet   ELIQUIS 5 MG TABS tablet   ezetimibe (ZETIA) 10 MG tablet   FEROSUL 325 (65 Fe) MG tablet    MULTAQ 400 MG tablet   oxyCODONE (OXY IR/ROXICODONE) 5 MG immediate release tablet   pantoprazole (PROTONIX) 40 MG tablet   pregabalin (LYRICA) 75 MG capsule   senna (SENOKOT) 8.6 MG tablet   tamsulosin (FLOMAX) 0.4 MG CAPS capsule   testosterone cypionate (DEPOTESTOSTERONE CYPIONATE) 200 MG/ML injection   traZODone (DESYREL) 50 MG tablet   No current facility-administered medications for this encounter.    Jodell Cipro Ward, PA-C [facility] [phone]

## 2023-12-16 IMAGING — DX DG KNEE 1-2V PORT*R*
2 series · 2 of 2 positions shown · non-contrast
Comparison: 01/04/2022

CLINICAL DATA: Right knee surgery

EXAM:
PORTABLE RIGHT KNEE - 1-2 VIEW

[knee ap]
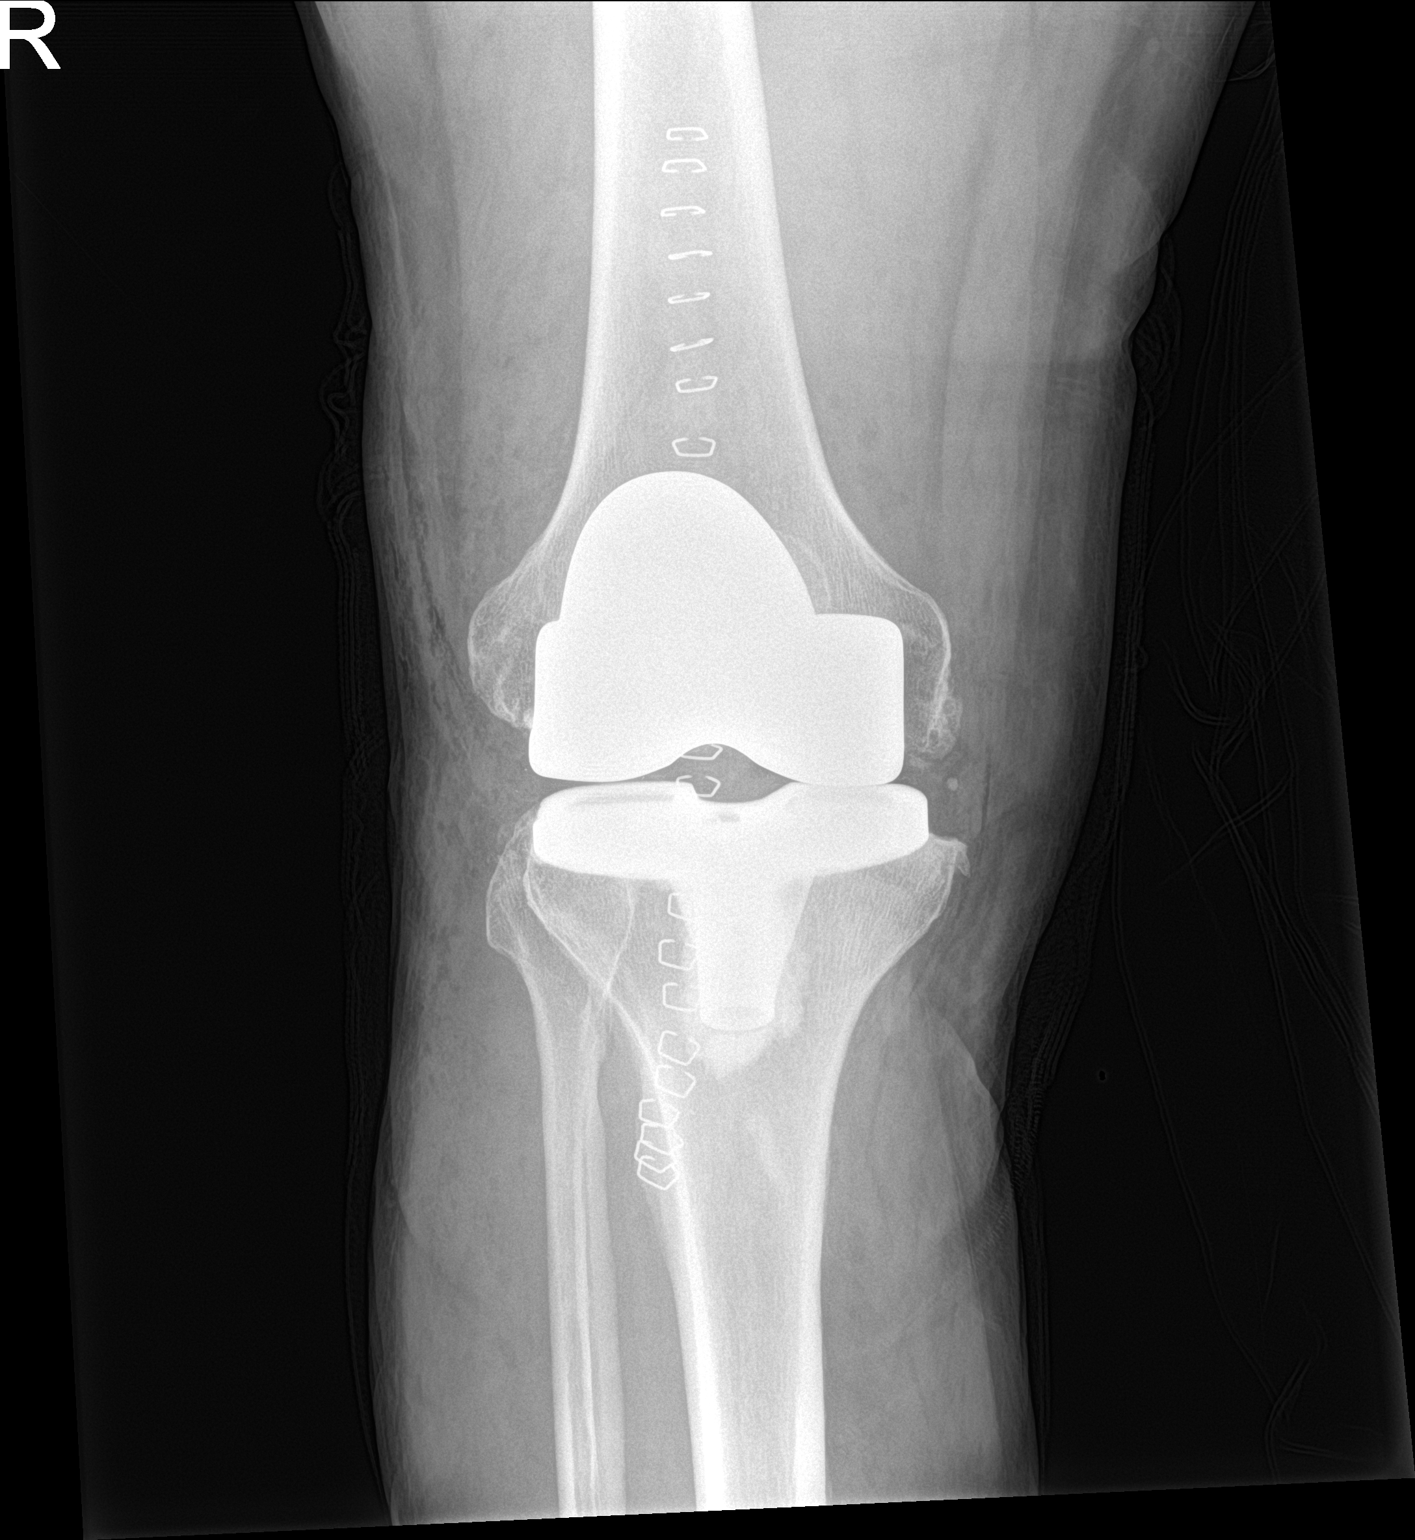

[knee lat]
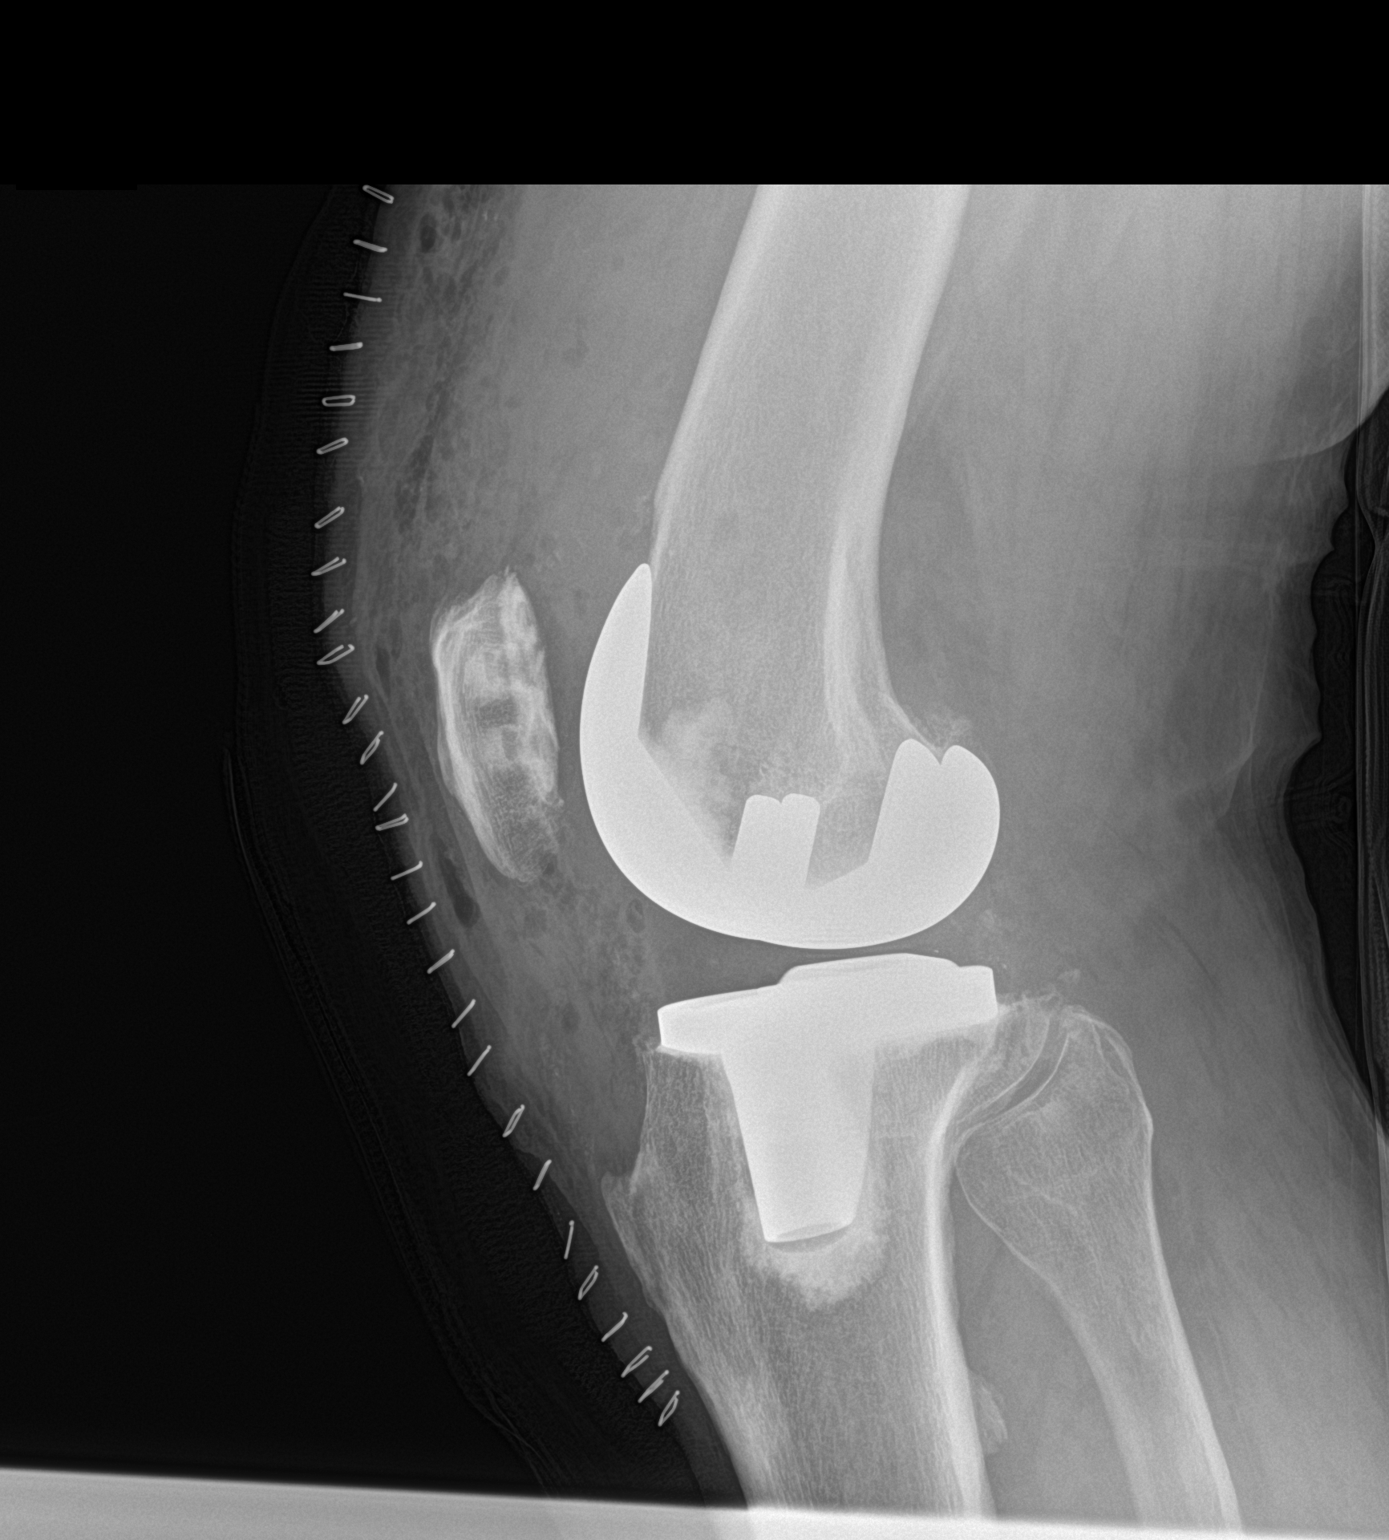

[2 of 2 positions shown; findings below may reference images not displayed]

FINDINGS: Interval postsurgical changes from right total knee arthroplasty.
Arthroplasty components are in their expected alignment. No
periprosthetic fracture or evidence of other complication. Expected
postoperative changes within the overlying soft tissues.
IMPRESSION: Interval postsurgical changes from right total knee arthroplasty.

## 2023-12-18 NOTE — Telephone Encounter (Signed)
 Dr. Inocencio,  Parker Daniel 67 year old male is requesting preoperative cardiac evaluation for TURP.  Procedure is scheduled for 12/25/2023.  He was recently seen by you in clinic on 12/11/2023.  Would you be able to provide/advise on cardiac risk for upcoming procedure.    Thank you for your help.  Please direct your response to [facility] preop pool.  Josefa HERO. Gwyndolyn Guilford NP-C     12/18/2023, 2:49 PM [facility] [address] Office [phone] Fax [phone]

## 2023-12-20 NOTE — Telephone Encounter (Signed)
   Patient Name: Parker Daniel  DOB: 05-13-1956 MRN: [id number]  Primary Cardiologist: Darryle ONEIDA Decent, MD  Chart reviewed as part of pre-operative protocol coverage. Given past medical history and time since last visit, based on ACC/AHA guidelines, Parker Daniel is at acceptable risk for the planned procedure without further cardiovascular testing.   Patient was last seen in office by Dr. Inocencio on 12/11/23. Per Dr. Inocencio, patient with intermediate risk for intermediate risk procedure.  Okay to hold Eliquis .  No further testing needed.  Per office protocol, patient can hold Eliquis  for 2 days prior to procedure.  Please resume when safe to do for from a bleeding standpoint.  I will route this recommendation to the requesting party via Epic fax function and remove from pre-op pool.  Please call with questions.  Gissel Keilman D Sidharth Leverette, NP 12/20/2023, 12:21 PM

## 2023-12-21 NOTE — Anesthesia Preprocedure Evaluation (Addendum)
Anesthesia Evaluation  Patient identified by MRN, date of birth, ID band Patient awake    Reviewed: Allergy & Precautions, NPO status , Patient's Chart, lab work & pertinent test results  Airway Mallampati: IV  TM Distance: >3 FB Neck ROM: Full    Dental  (+) Teeth Intact, Dental Advisory Given   Pulmonary  Snores at night, no witnessed apneas   Pulmonary exam normal breath sounds clear to auscultation       Cardiovascular hypertension (147/88 preop), Pt. on medications + CAD (nonobstructive)  Normal cardiovascular exam+ dysrhythmias (eliquis LD 2/8) Atrial Fibrillation  Rhythm:Regular Rate:Normal  Echo 01/02/2023 1. Left ventricular ejection fraction, by estimation, is 60 to 65%. The  left ventricle has normal function. The left ventricle has no regional  wall motion abnormalities. Left ventricular diastolic parameters were  normal.   2. Right ventricular systolic function is normal. The right ventricular  size is normal.   3. The mitral valve is normal in structure. No evidence of mitral valve  regurgitation. No evidence of mitral stenosis.   4. The aortic valve is normal in structure. Aortic valve regurgitation is  not visualized. No aortic stenosis is present.   5. The inferior vena cava is normal in size with greater than 50%  respiratory variability, suggesting right atrial pressure of 3 mmHg.     Neuro/Psych negative neurological ROS  negative psych ROS   GI/Hepatic Neg liver ROS,GERD  Medicated and Controlled,,  Endo/Other  diabetes, Well Controlled, Type 2  Obesity BMI 33  Renal/GU negative Renal ROS  negative genitourinary   Musculoskeletal  (+) Arthritis , Osteoarthritis,    Abdominal  (+) + obese  Peds  Hematology negative hematology ROS (+) Hb 12.5   Anesthesia Other Findings   Reproductive/Obstetrics negative OB ROS                             Anesthesia  Physical Anesthesia Plan  ASA: 2  Anesthesia Plan: General   Post-op Pain Management: Tylenol PO (pre-op)*   Induction: Intravenous  PONV Risk Score and Plan: 2 and Ondansetron, Dexamethasone, Midazolam and Treatment may vary due to age or medical condition  Airway Management Planned: LMA  Additional Equipment: None  Intra-op Plan:   Post-operative Plan: Extubation in OR  Informed Consent: I have reviewed the patients History and Physical, chart, labs and discussed the procedure including the risks, benefits and alternatives for the proposed anesthesia with the patient or authorized representative who has indicated his/her understanding and acceptance.     Dental advisory given  Plan Discussed with: CRNA  Anesthesia Plan Comments:        Anesthesia Quick Evaluation

## 2023-12-25 ENCOUNTER — Ambulatory Visit (HOSPITAL_COMMUNITY): Payer: Medicare HMO | Admitting: Physician Assistant

## 2023-12-25 ENCOUNTER — Ambulatory Visit (HOSPITAL_COMMUNITY)
Admission: RE | Admit: 2023-12-25 | Discharge: 2023-12-25 | Disposition: A | Discharge status: Home / Self Care | Payer: Medicare HMO | Attending: Urology | Admitting: Urology

## 2023-12-25 ENCOUNTER — Encounter (HOSPITAL_COMMUNITY): Payer: Self-pay | Admitting: Urology

## 2023-12-25 ENCOUNTER — Ambulatory Visit (HOSPITAL_BASED_OUTPATIENT_CLINIC_OR_DEPARTMENT_OTHER): Payer: Self-pay | Admitting: Certified Registered"

## 2023-12-25 ENCOUNTER — Encounter (HOSPITAL_COMMUNITY)
Admission: RE | Disposition: A | Discharge status: Home / Self Care | Payer: Self-pay | Source: Home / Self Care | Attending: Urology

## 2023-12-25 DIAGNOSIS — E119 Type 2 diabetes mellitus without complications: Secondary | ICD-10-CM | POA: Insufficient documentation

## 2023-12-25 DIAGNOSIS — E669 Obesity, unspecified: Secondary | ICD-10-CM | POA: Diagnosis not present

## 2023-12-25 DIAGNOSIS — N401 Enlarged prostate with lower urinary tract symptoms: Secondary | ICD-10-CM | POA: Insufficient documentation

## 2023-12-25 DIAGNOSIS — Z7901 Long term (current) use of anticoagulants: Secondary | ICD-10-CM | POA: Diagnosis not present

## 2023-12-25 DIAGNOSIS — K219 Gastro-esophageal reflux disease without esophagitis: Secondary | ICD-10-CM | POA: Diagnosis not present

## 2023-12-25 DIAGNOSIS — N400 Enlarged prostate without lower urinary tract symptoms: Secondary | ICD-10-CM | POA: Diagnosis not present

## 2023-12-25 DIAGNOSIS — Z6833 Body mass index (BMI) 33.0-33.9, adult: Secondary | ICD-10-CM | POA: Diagnosis not present

## 2023-12-25 DIAGNOSIS — N138 Other obstructive and reflux uropathy: Secondary | ICD-10-CM

## 2023-12-25 DIAGNOSIS — I10 Essential (primary) hypertension: Secondary | ICD-10-CM | POA: Diagnosis not present

## 2023-12-25 DIAGNOSIS — I4891 Unspecified atrial fibrillation: Secondary | ICD-10-CM | POA: Diagnosis not present

## 2023-12-25 DIAGNOSIS — I2510 Atherosclerotic heart disease of native coronary artery without angina pectoris: Secondary | ICD-10-CM | POA: Insufficient documentation

## 2023-12-25 DIAGNOSIS — Z79899 Other long term (current) drug therapy: Secondary | ICD-10-CM | POA: Diagnosis not present

## 2023-12-25 DIAGNOSIS — N320 Bladder-neck obstruction: Secondary | ICD-10-CM | POA: Diagnosis not present

## 2023-12-25 HISTORY — PX: TRANSURETHRAL RESECTION OF PROSTATE: SHX73

## 2023-12-25 LAB — GLUCOSE, CAPILLARY: Glucose-Capillary: 89 mg/dL (ref 70–99)

## 2023-12-25 SURGERY — TURP (TRANSURETHRAL RESECTION OF PROSTATE)
Anesthesia: General

## 2023-12-25 MED ORDER — PHENYLEPHRINE HCL (PRESSORS) 10 MG/ML IV SOLN
INTRAVENOUS | Status: AC
  Filled 2023-12-25: qty 1

## 2023-12-25 MED ORDER — HYDROMORPHONE HCL 1 MG/ML IJ SOLN
INTRAMUSCULAR | Status: AC
  Administered 2023-12-25: 0.5 mg via INTRAVENOUS
  Filled 2023-12-25: qty 1

## 2023-12-25 MED ORDER — MIDAZOLAM HCL 2 MG/2ML IJ SOLN
INTRAMUSCULAR | Status: DC | PRN
  Administered 2023-12-25: 2 mg via INTRAVENOUS

## 2023-12-25 MED ORDER — SODIUM CHLORIDE 0.9 % IR SOLN
Status: DC | PRN
  Administered 2023-12-25: 6000 mL

## 2023-12-25 MED ORDER — PHENYLEPHRINE 80 MCG/ML (10ML) SYRINGE FOR IV PUSH (FOR BLOOD PRESSURE SUPPORT)
PREFILLED_SYRINGE | INTRAVENOUS | Status: AC
  Filled 2023-12-25: qty 10

## 2023-12-25 MED ORDER — PHENYLEPHRINE 80 MCG/ML (10ML) SYRINGE FOR IV PUSH (FOR BLOOD PRESSURE SUPPORT)
PREFILLED_SYRINGE | INTRAVENOUS | Status: DC | PRN
  Administered 2023-12-25 (×2): 160 ug via INTRAVENOUS
  Administered 2023-12-25: 80 ug via INTRAVENOUS
  Administered 2023-12-25: 160 ug via INTRAVENOUS
  Administered 2023-12-25 (×3): 80 ug via INTRAVENOUS

## 2023-12-25 MED ORDER — SODIUM CHLORIDE 0.9 % IV SOLN
2.0000 g | INTRAVENOUS | Status: AC
  Administered 2023-12-25: 2 g via INTRAVENOUS
  Filled 2023-12-25: qty 20

## 2023-12-25 MED ORDER — DEXAMETHASONE SODIUM PHOSPHATE 10 MG/ML IJ SOLN
INTRAMUSCULAR | Status: AC
  Filled 2023-12-25: qty 1

## 2023-12-25 MED ORDER — MIDAZOLAM HCL 2 MG/2ML IJ SOLN
INTRAMUSCULAR | Status: AC
  Filled 2023-12-25: qty 2

## 2023-12-25 MED ORDER — LIDOCAINE 2% (20 MG/ML) 5 ML SYRINGE
INTRAMUSCULAR | Status: DC | PRN
  Administered 2023-12-25: 40 mg via INTRAVENOUS
  Administered 2023-12-25: 60 mg via INTRAVENOUS

## 2023-12-25 MED ORDER — LACTATED RINGERS IV SOLN: INTRAVENOUS | Status: DC

## 2023-12-25 MED ORDER — PROPOFOL 10 MG/ML IV BOLUS
INTRAVENOUS | Status: AC
  Filled 2023-12-25: qty 20

## 2023-12-25 MED ORDER — FENTANYL CITRATE (PF) 100 MCG/2ML IJ SOLN
INTRAMUSCULAR | Status: DC | PRN
  Administered 2023-12-25 (×2): 50 ug via INTRAVENOUS

## 2023-12-25 MED ORDER — OXYCODONE HCL 5 MG PO TABS
ORAL_TABLET | ORAL | Status: AC
  Administered 2023-12-25: 5 mg via ORAL
  Filled 2023-12-25: qty 1

## 2023-12-25 MED ORDER — OXYCODONE HCL 5 MG/5ML PO SOLN: 5.0000 mg | Freq: Once | ORAL | Status: AC | PRN

## 2023-12-25 MED ORDER — HYDROMORPHONE HCL 1 MG/ML IJ SOLN
0.2500 mg | INTRAMUSCULAR | Status: DC | PRN
  Administered 2023-12-25 (×2): 0.5 mg via INTRAVENOUS

## 2023-12-25 MED ORDER — KETOROLAC TROMETHAMINE 30 MG/ML IJ SOLN
INTRAMUSCULAR | Status: AC
  Filled 2023-12-25: qty 1

## 2023-12-25 MED ORDER — ONDANSETRON HCL 4 MG/2ML IJ SOLN
INTRAMUSCULAR | Status: DC | PRN
  Administered 2023-12-25: 4 mg via INTRAVENOUS

## 2023-12-25 MED ORDER — ACETAMINOPHEN 500 MG PO TABS
1000.0000 mg | ORAL_TABLET | Freq: Once | ORAL | Status: AC
  Administered 2023-12-25: 1000 mg via ORAL
  Filled 2023-12-25: qty 2

## 2023-12-25 MED ORDER — 0.9 % SODIUM CHLORIDE (POUR BTL) OPTIME
TOPICAL | Status: DC | PRN
  Administered 2023-12-25: 1000 mL

## 2023-12-25 MED ORDER — ONDANSETRON HCL 4 MG/2ML IJ SOLN: 4.0000 mg | Freq: Once | INTRAMUSCULAR | Status: DC | PRN

## 2023-12-25 MED ORDER — PROPOFOL 10 MG/ML IV BOLUS
INTRAVENOUS | Status: DC | PRN
  Administered 2023-12-25: 180 mg via INTRAVENOUS

## 2023-12-25 MED ORDER — CHLORHEXIDINE GLUCONATE 0.12 % MT SOLN
15.0000 mL | Freq: Once | OROMUCOSAL | Status: AC
  Administered 2023-12-25: 15 mL via OROMUCOSAL

## 2023-12-25 MED ORDER — ONDANSETRON HCL 4 MG/2ML IJ SOLN
INTRAMUSCULAR | Status: AC
  Filled 2023-12-25: qty 2

## 2023-12-25 MED ORDER — DEXAMETHASONE SODIUM PHOSPHATE 10 MG/ML IJ SOLN
INTRAMUSCULAR | Status: DC | PRN
  Administered 2023-12-25: 4 mg via INTRAVENOUS

## 2023-12-25 MED ORDER — FENTANYL CITRATE (PF) 100 MCG/2ML IJ SOLN
INTRAMUSCULAR | Status: AC
  Filled 2023-12-25: qty 2

## 2023-12-25 MED ORDER — ORAL CARE MOUTH RINSE: 15.0000 mL | Freq: Once | OROMUCOSAL | Status: AC

## 2023-12-25 MED ORDER — OXYCODONE HCL 5 MG PO TABS: 5.0000 mg | ORAL_TABLET | Freq: Once | ORAL | Status: AC | PRN

## 2023-12-25 MED ORDER — AMISULPRIDE (ANTIEMETIC) 5 MG/2ML IV SOLN: 10.0000 mg | Freq: Once | INTRAVENOUS | Status: DC | PRN

## 2023-12-25 SURGICAL SUPPLY — 20 items
BAG URINE DRAIN 2000ML AR STRL (UROLOGICAL SUPPLIES) ×1 IMPLANT
BAG URO CATCHER STRL LF (MISCELLANEOUS) ×1 IMPLANT
CATH FOLEY 2WAY SLVR 5CC 18FR (CATHETERS) IMPLANT
DRAPE FOOT SWITCH (DRAPES) ×1 IMPLANT
EVACUATOR MICROVAS BLADDER (UROLOGICAL SUPPLIES) ×1 IMPLANT
GLOVE SURG LX STRL 7.5 STRW (GLOVE) ×1 IMPLANT
GOWN STRL REIN 3XL XLG LVL4 (GOWN DISPOSABLE) IMPLANT
GOWN STRL REUS W/ TWL XL LVL3 (GOWN DISPOSABLE) ×1 IMPLANT
HOLDER FOLEY CATH W/STRAP (MISCELLANEOUS) IMPLANT
KIT TURNOVER KIT A (KITS) IMPLANT
LOOP CUT BIPOLAR 24F LRG (ELECTROSURGICAL) IMPLANT
MANIFOLD NEPTUNE II (INSTRUMENTS) ×1 IMPLANT
PACK CYSTO (CUSTOM PROCEDURE TRAY) ×1 IMPLANT
PAD PREP 24X48 CUFFED NSTRL (MISCELLANEOUS) ×1 IMPLANT
SYR 30ML LL (SYRINGE) IMPLANT
SYR TOOMEY IRRIG 70ML (MISCELLANEOUS) ×1 IMPLANT
SYRINGE TOOMEY IRRIG 70ML (MISCELLANEOUS) ×1 IMPLANT
TUBING CONNECTING 10 (TUBING) ×1 IMPLANT
TUBING UROLOGY SET (TUBING) ×1 IMPLANT
WATER STERILE IRR 500ML POUR (IV SOLUTION) IMPLANT

## 2023-12-25 NOTE — Anesthesia Procedure Notes (Addendum)
Procedure Name: LMA Insertion Date/Time: 12/25/2023 12:31 PM  Performed by: Sindy Guadeloupe, CRNAPre-anesthesia Checklist: Patient identified, Emergency Drugs available, Suction available, Patient being monitored and Timeout performed Patient Re-evaluated:Patient Re-evaluated prior to induction Oxygen Delivery Method: Circle system utilized Preoxygenation: Pre-oxygenation with 100% oxygen Induction Type: IV induction Ventilation: Mask ventilation without difficulty LMA: LMA inserted LMA Size: 5.0 Number of attempts: 1 Tube secured with: Tape Dental Injury: Teeth and Oropharynx as per pre-operative assessment

## 2023-12-25 NOTE — Progress Notes (Deleted)
  Cardiology Office Note:  .   Date:  12/25/2023  ID:  NISAIAH BECHTOL, DOB Nov 16, 1955, MRN [id number] PCP: System, Provider Not In  [facility] Providers Cardiologist:  Reatha Harps, MD Electrophysiologist:  Will Jorja Loa, MD { Click to update primary MD,subspecialty MD or APP then REFRESH:1}   History of Present Illness: .   No chief complaint on file.   Parker Daniel is a 68 y.o. male with history of persistent Afib, non-obstructive CAD, DM, HTN, HLD who presents for follow-up.      Problem List Atrial fibrillation  -DCCV 01/25/2021 -DCCV 06/11/2023 -CHADSVASC= 4 (age, HTN, DM, CAD) 2. CAD -mild 25-49% -258 (74th percentile) 3. DM -A1c 5.7 4. HLD -T chol 99, HDL 38, LDL 44, TG 84 5. HTN 6. GI bleed -gastric ulcer 10/2023    ROS: All other ROS reviewed and negative. Pertinent positives noted in the HPI.     Studies Reviewed: Marland Kitchen     TTE 01/02/2023  1. Left ventricular ejection fraction, by estimation, is 60 to 65%. The  left ventricle has normal function. The left ventricle has no regional  wall motion abnormalities. Left ventricular diastolic parameters were  normal.   2. Right ventricular systolic function is normal. The right ventricular  size is normal.   3. The mitral valve is normal in structure. No evidence of mitral valve  regurgitation. No evidence of mitral stenosis.   4. The aortic valve is normal in structure. Aortic valve regurgitation is  not visualized. No aortic stenosis is present.   5. The inferior vena cava is normal in size with greater than 50%  respiratory variability, suggesting right atrial pressure of 3 mmHg.   CCTA 12/23/2022 IMPRESSION: 1. Mild non-obstructive CAD, CADRADS = 2. See comments re: Visual stenosis in distal RCA that is more consistent with slab artifact.   2. Coronary calcium score of 258. This was 74th percentile for age and sex matched control.   3. Normal coronary origin with right dominance.   Physical  Exam:   VS:  There were no vitals taken for this visit.   Wt Readings from Last 3 Encounters:  12/25/23 231 lb (104.8 kg)  12/12/23 231 lb (104.8 kg)  12/11/23 233 lb (105.7 kg)    GEN: Well nourished, well developed in no acute distress NECK: No JVD; No carotid bruits CARDIAC: ***RRR, no murmurs, rubs, gallops RESPIRATORY:  Clear to auscultation without rales, wheezing or rhonchi  ABDOMEN: Soft, non-tender, non-distended EXTREMITIES:  No edema; No deformity  ASSESSMENT AND PLAN: .   ***    {Are you ordering a CV Procedure (e.g. stress test, cath, DCCV, TEE, etc)?   Press F2        :[id number]}   Follow-up: No follow-ups on file.  Time Spent with Patient: I have spent a total of *** minutes caring for this patient today face to face, ordering and reviewing labs/tests, reviewing prior records/medical history, examining the patient, establishing an assessment and plan, communicating results/findings to the patient/family, and documenting in the medical record.   Signed, Lenna Gilford. Flora Lipps, MD, [facility]  [facility]  [address], [address] [place], [place] [address] [phone]  1:28 PM

## 2023-12-25 NOTE — H&P (Signed)
H&P  Chief Complaint: BPH with lower urinary tract symptoms  History of Present Illness: Parker Daniel is a 68 year old male with a history of BPH.  He underwent a greenlight photo vaporization in 2019.  2023 cystoscopy at a moderate bladder neck contracture and some residual tissue.  He underwent a thulium laser vaporization of the prostate incision at the bladder neck.  Initially did well but he had recurrent weak stream frequency and urgency.  Office cystoscopy again revealed some residual prostate tissue and a moderate bladder neck contracture.  He is brought today for transurethral resection of the prostate.  He has been well without dysuria or gross hematuria.  No chest pain or shortness of breath.  No fever, cough, cold or congestion.  Past Medical History:  Diagnosis Date   Arthritis    Coronary artery disease    Diabetes mellitus without complication (HCC)    Dyspnea    with exertion   Dysrhythmia    GERD (gastroesophageal reflux disease)    Gout    History of kidney stones    Hyperlipidemia    Hypertension    Pneumonia    10/2021   Past Surgical History:  Procedure Laterality Date   APPENDECTOMY     age 51   BIOPSY  10/17/2023   Procedure: BIOPSY;  Surgeon: Willis Modena, MD;  Location: [facility];  Service: Gastroenterology;;   CARDIOVERSION N/A 06/11/2023   Procedure: CARDIOVERSION;  Surgeon: Thurmon Fair, MD;  [facility];  Service: Cardiovascular;  Laterality: N/A;   CHOLECYSTECTOMY     age 70   CYSTOSCOPY/URETEROSCOPY/HOLMIUM LASER/STENT PLACEMENT Right 01/24/2022   Procedure: CYSTOSCOPY/RETROGRADE/URETEROSCOPY/HOLMIUM LASER/STENT PLACEMENT;  Surgeon: Jerilee Field, MD;  Location[facility];  Service: Urology;  Laterality: Right;   ESOPHAGOGASTRODUODENOSCOPY (EGD) WITH PROPOFOL Left 10/17/2023   Procedure: ESOPHAGOGASTRODUODENOSCOPY (EGD) WITH PROPOFOL;  Surgeon: Willis Modena, MD;  [facility];  Service: Gastroenterology;  Laterality:  Left;   KNEE SURGERY Right    total knee   SURGERY SCROTAL / TESTICULAR     age 32   THULIUM LASER TURP (TRANSURETHRAL RESECTION OF PROSTATE) N/A 01/24/2022   Procedure: THULIUM LASER TURP (TRANSURETHRAL RESECTION OF PROSTATE);  Surgeon: Jerilee Field, MD;  Location: [facility];  Service: Urology;  Laterality: N/A;   TONSILLECTOMY     TOTAL HIP ARTHROPLASTY Left 06/27/2019   Procedure: LEFT TOTAL HIP ARTHROPLASTY ANTERIOR APPROACH;  Surgeon: Kathryne Hitch, MD;  Location: [facility];  Service: Orthopedics;  Laterality: Left;   TOTAL KNEE ARTHROPLASTY Right 03/17/2022   Procedure: RIGHT TOTAL KNEE ARTHROPLASTY;  Surgeon: Kathryne Hitch, MD;  Location: [facility];  Service: Orthopedics;  Laterality: Right;    Home Medications:  Medications Prior to Admission  Medication Sig Dispense Refill Last Dose/Taking   acetaminophen (TYLENOL) 500 MG tablet Take 1,000 mg by mouth every 8 (eight) hours as needed for moderate pain.   Past Month   allopurinol (ZYLOPRIM) 300 MG tablet Take 300 mg by mouth in the morning.   12/25/2023 Morning   ELIQUIS 5 MG TABS tablet Take 1 tablet (5 mg total) by mouth 2 (two) times daily.   12/22/2023 Morning   ezetimibe (ZETIA) 10 MG tablet Take 10 mg by mouth in the morning.   Past Week   FEROSUL 325 (65 Fe) MG tablet Take 325 mg by mouth 3 (three) times a week.   Past Week   MULTAQ 400 MG tablet Take 400 mg by mouth 2 (two) times daily. 6pm and 6 am  12/25/2023 Morning   oxyCODONE (OXY IR/ROXICODONE) 5 MG immediate release tablet Take 5 mg by mouth in the morning, at noon, in the evening, and at bedtime.   Taking   pantoprazole (PROTONIX) 40 MG tablet Take 1 tablet (40 mg total) by mouth 2 (two) times daily. 60 tablet 11 12/25/2023 Morning   pregabalin (LYRICA) 75 MG capsule Take 75 mg by mouth 2 (two) times daily.   12/25/2023 Morning   senna (SENOKOT) 8.6 MG tablet Take 2-3 tablets by mouth daily as needed for constipation.   Past Week   tamsulosin (FLOMAX) 0.4  MG CAPS capsule Take 0.4 mg by mouth in the morning.   12/25/2023 Morning   traZODone (DESYREL) 50 MG tablet Take 50 mg by mouth at bedtime as needed for sleep.   Taking As Needed   atorvastatin (LIPITOR) 40 MG tablet Take 1 tablet (40 mg total) by mouth daily. 90 tablet 3    testosterone cypionate (DEPOTESTOSTERONE CYPIONATE) 200 MG/ML injection Inject 200 mg into the muscle every 21 ( twenty-one) days.   More than a month   Allergies:  Allergies  Allergen Reactions   Atorvastatin Other (See Comments)    Other reaction(s): Myalgia Takes pravastatin at home   Rosuvastatin Other (See Comments)    Other reaction(s): Myalgia Takes pravastatin at home    Family History  Problem Relation Age of Onset   Cancer Father        bladder   Social History:  reports that he has never smoked. He has never used smokeless tobacco. He reports current alcohol use. He reports that he does not use drugs.  ROS: A complete review of systems was performed.  All systems are negative except for pertinent findings as noted. Review of Systems  All other systems reviewed and are negative.    Physical Exam:  Vital signs in last 24 hours: Temp:  [98.1 F (36.7 C)] 98.1 F (36.7 C) (02/11 1051) Pulse Rate:  [85] 85 (02/11 1051) Resp:  [18] 18 (02/11 1051) BP: (147)/(88) 147/88 (02/11 1051) SpO2:  [97 %] 97 % (02/11 1051) Weight:  [104.8 kg] 104.8 kg (02/11 1051) General:  Alert and oriented, No acute distress HEENT: Normocephalic, atraumatic Cardiovascular: Regular rate and rhythm Lungs: Regular rate and effort Abdomen: Soft, nontender, nondistended, no abdominal masses Back: No CVA tenderness Extremities: No edema Neurologic: Grossly intact  Laboratory Data:  No results found for this or any previous visit (from the past 24 hours). No results found for this or any previous visit (from the past 240 hours). Creatinine: No results for input(s): "CREATININE" in the last 168  hours.  Impression/Assessment:  BPH with lower urinary tract symptoms- I discussed with the patient and his wife the nature, potential benefits, risks and alternatives to TURP, including side effects of the proposed treatment, the likelihood of the patient achieving the goals of the procedure, and any potential problems that might occur during the procedure or recuperation. I discussed we want to do out best to make sure his outlet is open but avoid risk of bladder neck contracture, prostatic or urethral stricture as well as incontinence among other risks.  We discussed expectations for flow symptoms versus storage symptoms post-op.  We discussed his constipation could certainly be causing some frequency and urgency and he should discuss management with his PCP or GI doctor.  Plan:  TURP  Jerilee Field 12/25/2023, 12:14 PM

## 2023-12-25 NOTE — Transfer of Care (Signed)
Immediate Anesthesia Transfer of Care Note  Patient: Parker Daniel  Procedure(s) Performed: TRANSURETHRAL RESECTION OF THE PROSTATE (TURP)  Patient Location: PACU  Anesthesia Type:General  Level of Consciousness: awake, alert , and patient cooperative  Airway & Oxygen Therapy: Patient Spontanous Breathing and Patient connected to face mask oxygen  Post-op Assessment: Report given to RN and Post -op Vital signs reviewed and stable  Post vital signs: Reviewed and stable  Last Vitals:  Vitals Value Taken Time  BP 145/106 12/25/23 1332  Temp    Pulse 85 12/25/23 1334  Resp 16   SpO2 100 % 12/25/23 1334  Vitals shown include unfiled device data.  Last Pain:  Vitals:   12/25/23 1051  TempSrc: Oral  PainSc: 3       Patients Stated Pain Goal: 4 (12/25/23 1051)  Complications: No notable events documented.

## 2023-12-25 NOTE — Anesthesia Postprocedure Evaluation (Signed)
Anesthesia Post Note  Patient: Parker Daniel  Procedure(s) Performed: TRANSURETHRAL RESECTION OF THE PROSTATE (TURP)     Patient location during evaluation: PACU Anesthesia Type: General Level of consciousness: awake and alert Pain management: pain level controlled Vital Signs Assessment: post-procedure vital signs reviewed and stable Respiratory status: spontaneous breathing, nonlabored ventilation, respiratory function stable and patient connected to nasal cannula oxygen Cardiovascular status: blood pressure returned to baseline and stable Postop Assessment: no apparent nausea or vomiting Anesthetic complications: no  No notable events documented.  Last Vitals:  Vitals:   12/25/23 1402 12/25/23 1415  BP: (!) 150/76 138/85  Pulse: 73 74  Resp: 15   Temp:  36.6 C  SpO2: 100% 96%    Last Pain:  Vitals:   12/25/23 1415  TempSrc:   PainSc: 5                  Trevor Iha

## 2023-12-25 NOTE — Op Note (Signed)
Preoperative diagnosis: BPH Postoperative diagnosis: Same  Procedure: Transurethral resection of prostate  Surgeon: Mena Goes  Anesthesia: General  Indication for procedure: Parker Daniel is a 68 year old male with a history of BPH and lower urinary tract symptoms.  He underwent greenlight photo vaporization and had a moderate bladder neck contracture.  He underwent a thulium laser vaporization of the prostate and incision of the bladder neck but he is healed up a bit tight.  He again he has weak stream and frequency.  He had a moderate bladder neck obstruction and some residual obstructing tissue on office cystoscopy.  Findings: On cystoscopy there was really more anterior and lateral tissue.  The bladder neck was patent inferiorly but from 3:00 up to 12 and 9:00 up to 12 it was tight with significant tissue.  The 44 French scope went in the bladder with some resistance and the 58 [place] scope encountered more resistance.  The ureteral orifice ease were normal.  There was no stone or foreign body in the bladder.  No mucosal lesions.  On DRE the prostate was about 30 g and smooth without hard area or nodule.  Penis was circumcised without mass or lesion.  The glans and meatus appeared normal.  Description of procedure: After consent was obtained patient brought to the operating room.  After adequate anesthesia he is placed lithotomy position and prepped and draped in the usual sterile fashion.  Timeout was performed to confirm the patient and procedure.  The cystoscope was passed per urethra the bladder carefully inspected.  Cystoscope was removed and the 26 French resectoscope continuous-flow sheath was passed with the visual obturator and then the loop and handle passed.  Because of his anatomy I found the verumontanum and then went back up toward the bladder neck and started around 2:00 on the patient's left side.  I incised the prostate to the bladder neck and carefully brought this down toward the  verumontanum repeatedly check in the verumontanum for its location.  Similarly I made an incision over at the 10 o'clock position on the patient's right up into the bladder neck and prostate and brought that down toward the verumontanum.  There was some anterior tissue in between the 2 incisions and this was incised opening up the bladder neck.  I then again inspected the verumontanum and did some slight lateral resection on both sides but no further incisions on the bladder neck and again repeatedly checking the location of the verumontanum.  No resection was distal to this.  This created a nice open channel to 26 [place] scope easily went through.  All the small chips were evacuated.  Hemostasis was excellent.  The scope was backed out and an 53 [place] two-way catheter was left to gravity drainage.  Urine was clear.  DRE was performed.  He was awakened taken to the recovery room in stable condition.  Complications: None  Blood loss: Minimal  Specimens to pathology: TURP chips  Drains: 18 French Foley catheter  Disposition: Patient stable to PACU.

## 2023-12-26 ENCOUNTER — Encounter (HOSPITAL_COMMUNITY): Payer: Self-pay | Admitting: Urology

## 2023-12-26 ENCOUNTER — Ambulatory Visit: Payer: Medicare HMO | Admitting: Cardiovascular Disease

## 2023-12-26 LAB — SURGICAL PATHOLOGY

## 2024-01-07 NOTE — Telephone Encounter (Signed)
 Called pt to discuss further.  States he tried to go back on [facility] but started bleeding and they held it again.  Pt is trying to restart this week to see how things go.  He is going to let us know next week how things are going and will further discuss w/ MD.

## 2024-01-14 ENCOUNTER — Ambulatory Visit
Admission: RE | Admit: 2024-01-14 | Discharge: 2024-01-14 | Disposition: A | Discharge status: Home / Self Care | Source: Ambulatory Visit | Attending: Gastroenterology | Admitting: Gastroenterology

## 2024-01-14 ENCOUNTER — Other Ambulatory Visit: Payer: Self-pay | Admitting: Gastroenterology

## 2024-01-14 DIAGNOSIS — R109 Unspecified abdominal pain: Secondary | ICD-10-CM

## 2024-01-16 ENCOUNTER — Telehealth: Payer: Self-pay | Admitting: Cardiology

## 2024-01-16 ENCOUNTER — Encounter (INDEPENDENT_AMBULATORY_CARE_PROVIDER_SITE_OTHER): Payer: Self-pay

## 2024-01-16 NOTE — Telephone Encounter (Signed)
 Tiffany from [facility] requesting cb to see if Dr Elberta Fortis received message from Dr Willis Modena regarding pt's endoscopy. Requesting cb to [phone]

## 2024-01-16 NOTE — Telephone Encounter (Signed)
 Spoke with Tiffany at [facility], informed her Dr. Elberta Fortis and his nurse are not in the office today. Tiffany states Dr. Dulce Sellar sent an Epic staff message to Dr. Elberta Fortis regarding patient and would like to confirm he received this message.  Will forward to Dr. Elberta Fortis and his nurse Sherri to address and follow-up when they return to the office. Tiffany requests callback on this matter at [phone].

## 2024-01-22 ENCOUNTER — Telehealth (HOSPITAL_COMMUNITY): Payer: Self-pay

## 2024-01-22 LAB — CBC
Hematocrit: 37.4 % — ABNORMAL LOW (ref 37.5–51.0)
Hemoglobin: 11.4 g/dL — ABNORMAL LOW (ref 13.0–17.7)
MCH: 24.9 pg — ABNORMAL LOW (ref 26.6–33.0)
MCHC: 30.5 g/dL — ABNORMAL LOW (ref 31.5–35.7)
MCV: 82 fL (ref 79–97)
Platelets: 219 10*3/uL (ref 150–450)
RBC: 4.58 x10E6/uL (ref 4.14–5.80)
RDW: 15.5 % — ABNORMAL HIGH (ref 11.6–15.4)
WBC: 5.9 10*3/uL (ref 3.4–10.8)

## 2024-01-22 LAB — BASIC METABOLIC PANEL
BUN/Creatinine Ratio: 10 (ref 10–24)
BUN: 10 mg/dL (ref 8–27)
CO2: 22 mmol/L (ref 20–29)
Calcium: 8.9 mg/dL (ref 8.6–10.2)
Chloride: 104 mmol/L (ref 96–106)
Creatinine, Ser: 0.98 mg/dL (ref 0.76–1.27)
Glucose: 91 mg/dL (ref 70–99)
Potassium: 4.2 mmol/L (ref 3.5–5.2)
Sodium: 140 mmol/L (ref 134–144)
eGFR: 86 mL/min/{1.73_m2} (ref >59)

## 2024-01-22 NOTE — Telephone Encounter (Signed)
 Spoke with patient to complete one month pre-procedure call.     New medical conditions? No Recent hospitalizations or surgeries? TURP- 12/25/23, denies any hematuria.   Started any new medications? Linzess Patient made aware to contact office to inform of any new medications started. Any changes in activities of daily living? No  Pre-procedure testing scheduled: CT on 01/23/24 and lab work completed on 01/22/24 Confirmed patient that he restarted taking Eliquis on 01/15/24 and will continue taking medication before procedure or it may need to be rescheduled.  Confirmed patient is scheduled for Atrial Fibrillation Ablation on Friday, April 4 with Dr. Loman Brooklyn. Instructed patient to arrive at the Main Entrance A at [facility]: [address] [place], [place] [address] and check in at Admitting at 5:30 AM  Advised of plan to go home the same day and will only stay overnight if medically necessary. You MUST have a responsible adult to drive you home and MUST be with you the first 24 hours after you arrive home or your procedure could be cancelled.  Patient verbalized understanding to information provided and is agreeable to proceed with procedure.

## 2024-01-23 ENCOUNTER — Ambulatory Visit (HOSPITAL_COMMUNITY)
Admission: RE | Admit: 2024-01-23 | Discharge: 2024-01-23 | Disposition: A | Discharge status: Home / Self Care | Payer: Medicare HMO | Source: Ambulatory Visit | Attending: Cardiology | Admitting: Cardiology

## 2024-01-23 DIAGNOSIS — I4819 Other persistent atrial fibrillation: Secondary | ICD-10-CM | POA: Insufficient documentation

## 2024-01-23 MED ORDER — IOHEXOL 350 MG/ML SOLN
95.0000 mL | Freq: Once | INTRAVENOUS | Status: AC | PRN
  Administered 2024-01-23: 95 mL via INTRAVENOUS

## 2024-02-08 ENCOUNTER — Telehealth (HOSPITAL_COMMUNITY): Payer: Self-pay

## 2024-02-08 NOTE — Telephone Encounter (Signed)
 Call placed to patient to discuss upcoming procedure.   CT: completed.  Labs: completed.   Any recent signs of acute illness or been started on antibiotics? No Any new medications started? No Any medications to hold? No Any missed doses of blood thinner?  No Advised patient to continue taking ANTICOAGULANT: Eliquis (Apixaban) without missing any doses.  Medication instructions:  On the morning of your procedure DO NOT take any medication., including Eliquis or the procedure may be rescheduled. Nothing to eat or drink after midnight prior to your procedure.  Confirmed patient is scheduled for Atrial Fibrillation Ablation on Friday, April 4 with Dr. Loman Brooklyn. Instructed patient to arrive at the Main Entrance A at [facility]: [address] [place], [place] [address] and check in at Admitting at 5:30 AM  Advised of plan to go home the same day and will only stay overnight if medically necessary. You MUST have a responsible adult to drive you home and MUST be with you the first 24 hours after you arrive home or your procedure could be cancelled.  Patient verbalized understanding to all instructions provided and agreed to proceed with procedure.

## 2024-02-14 NOTE — Pre-Procedure Instructions (Signed)
 Instructed patient on the following items: Arrival time 0515 Nothing to eat or drink after midnight No meds AM of procedure Responsible person to drive you home and stay with you for 24 hrs  Have you missed any doses of anti-coagulant Eliquis- takes twice a day, hasn't missed any doses.  Don't take dose morning of procedure.

## 2024-02-14 NOTE — Anesthesia Preprocedure Evaluation (Signed)
 Anesthesia Evaluation  Patient identified by MRN, date of birth, ID band Patient awake    Reviewed: Allergy & Precautions, NPO status , Patient's Chart, lab work & pertinent test results  Airway Mallampati: III  TM Distance: >3 FB Neck ROM: Full    Dental no notable dental hx. (+) Teeth Intact, Dental Advisory Given   Pulmonary neg pulmonary ROS   Pulmonary exam normal breath sounds clear to auscultation       Cardiovascular hypertension, + CAD  Normal cardiovascular exam+ dysrhythmias (eliquis) Atrial Fibrillation  Rhythm:Regular Rate:Normal  TTE 2024 1. Left ventricular ejection fraction, by estimation, is 60 to 65%. The  left ventricle has normal function. The left ventricle has no regional  wall motion abnormalities. Left ventricular diastolic parameters were  normal.   2. Right ventricular systolic function is normal. The right ventricular  size is normal.   3. The mitral valve is normal in structure. No evidence of mitral valve  regurgitation. No evidence of mitral stenosis.   4. The aortic valve is normal in structure. Aortic valve regurgitation is  not visualized. No aortic stenosis is present.   5. The inferior vena cava is normal in size with greater than 50%  respiratory variability, suggesting right atrial pressure of 3 mmHg.     Neuro/Psych negative neurological ROS  negative psych ROS   GI/Hepatic ,GERD  ,,(+)     substance abuse    Endo/Other  diabetes, Type 2    Renal/GU negative Renal ROS  negative genitourinary   Musculoskeletal  (+) Arthritis ,  narcotic dependent  Abdominal   Peds  Hematology negative hematology ROS (+)   Anesthesia Other Findings   Reproductive/Obstetrics                             Anesthesia Physical Anesthesia Plan  ASA: 3  Anesthesia Plan: General   Post-op Pain Management: Tylenol PO (pre-op)*   Induction: Intravenous  PONV Risk  Score and Plan: 2 and Midazolam, Dexamethasone and Ondansetron  Airway Management Planned: Oral ETT  Additional Equipment:   Intra-op Plan:   Post-operative Plan: Extubation in OR  Informed Consent: I have reviewed the patients History and Physical, chart, labs and discussed the procedure including the risks, benefits and alternatives for the proposed anesthesia with the patient or authorized representative who has indicated his/her understanding and acceptance.     Dental advisory given  Plan Discussed with: CRNA  Anesthesia Plan Comments:        Anesthesia Quick Evaluation

## 2024-02-14 NOTE — H&P (Signed)
  Electrophysiology Office Note:   Date:  02/15/2024  ID:  Parker Daniel, DOB 1956-02-03, MRN [id number]  Primary Cardiologist: Reatha Harps, MD Primary Heart Failure: None Electrophysiologist: Melody Savidge Jorja Loa, MD      History of Present Illness:   Parker Daniel is a 68 y.o. male with h/o atrial fibrillation, hypertension, diabetes seen today for routine electrophysiology followup.   Today, denies symptoms of palpitations, chest pain, shortness of breath, orthopnea, PND, lower extremity edema, claudication, dizziness, presyncope, syncope, bleeding, or neurologic sequela. The patient is tolerating medications without difficulties. Plan ablation today.   EP Information / Studies Reviewed:    EKG is not ordered today. EKG from 11/12/2023 reviewed which showed atrial fibrillation        Risk Assessment/Calculations:    CHA2DS2-VASc Score = 4   This indicates a 4.8% annual risk of stroke. The patient's score is based upon: CHF History: 0 HTN History: 1 Diabetes History: 1 Stroke History: 0 Vascular Disease History: 1 Age Score: 1 Gender Score: 0            Physical Exam:   VS:  BP 125/82   Pulse 78   Temp 98.1 F (36.7 C) (Oral)   Resp 20   Ht 5\' 10"  (1.778 m)   Wt 104.3 kg   SpO2 96%   BMI 33.00 kg/m    Wt Readings from Last 3 Encounters:  02/15/24 104.3 kg  12/25/23 104.8 kg  12/12/23 104.8 kg    GEN: No acute distress.   Neck: No JVD Cardiac: RRR, no murmurs, rubs, or gallops.  Respiratory: decreased BS bases bilaterally. GI: Soft, nontender, non-distended  MS: No edema; No deformity. Neuro:  Nonfocal  Skin: warm and dry Psych: Normal affect    ASSESSMENT AND PLAN:    1.  Persistent atrial fibrillation: Parker Daniel has presented today for surgery, with the diagnosis of AF.  The various methods of treatment have been discussed with the patient and family. After consideration of risks, benefits and other options for treatment, the patient has consented  to  Procedure(s): Catheter ablation as a surgical intervention .  Risks include but not limited to complete heart block, stroke, esophageal damage, nerve damage, bleeding, vascular damage, tamponade, perforation, MI, and death. The patient's history has been reviewed, patient examined, no change in status, stable for surgery.  I have reviewed the patient's chart and labs.  Questions were answered to the patient's satisfaction.    Janaa Acero Elberta Fortis, MD 02/15/2024 7:08 AM

## 2024-02-15 ENCOUNTER — Ambulatory Visit (HOSPITAL_COMMUNITY)
Admission: RE | Admit: 2024-02-15 | Discharge: 2024-02-15 | Disposition: A | Discharge status: Home / Self Care | Payer: Medicare HMO | Attending: Cardiology | Admitting: Cardiology

## 2024-02-15 ENCOUNTER — Encounter (HOSPITAL_COMMUNITY)
Admission: RE | Disposition: A | Discharge status: Home / Self Care | Payer: Self-pay | Source: Home / Self Care | Attending: Cardiology

## 2024-02-15 ENCOUNTER — Other Ambulatory Visit: Payer: Self-pay

## 2024-02-15 ENCOUNTER — Ambulatory Visit (HOSPITAL_BASED_OUTPATIENT_CLINIC_OR_DEPARTMENT_OTHER): Payer: Self-pay | Admitting: Certified Registered Nurse Anesthetist

## 2024-02-15 ENCOUNTER — Ambulatory Visit (HOSPITAL_COMMUNITY): Payer: Self-pay | Admitting: Certified Registered Nurse Anesthetist

## 2024-02-15 ENCOUNTER — Encounter (HOSPITAL_COMMUNITY): Payer: Self-pay | Admitting: Cardiology

## 2024-02-15 DIAGNOSIS — I4819 Other persistent atrial fibrillation: Secondary | ICD-10-CM | POA: Insufficient documentation

## 2024-02-15 DIAGNOSIS — I4891 Unspecified atrial fibrillation: Secondary | ICD-10-CM

## 2024-02-15 DIAGNOSIS — E785 Hyperlipidemia, unspecified: Secondary | ICD-10-CM

## 2024-02-15 DIAGNOSIS — I10 Essential (primary) hypertension: Secondary | ICD-10-CM | POA: Insufficient documentation

## 2024-02-15 DIAGNOSIS — I2510 Atherosclerotic heart disease of native coronary artery without angina pectoris: Secondary | ICD-10-CM

## 2024-02-15 DIAGNOSIS — E119 Type 2 diabetes mellitus without complications: Secondary | ICD-10-CM | POA: Insufficient documentation

## 2024-02-15 HISTORY — PX: ATRIAL FIBRILLATION ABLATION: EP1191

## 2024-02-15 LAB — GLUCOSE, CAPILLARY: Glucose-Capillary: 99 mg/dL (ref 70–99)

## 2024-02-15 LAB — POCT ACTIVATED CLOTTING TIME: Activated Clotting Time: 222 s

## 2024-02-15 SURGERY — ATRIAL FIBRILLATION ABLATION
Anesthesia: General

## 2024-02-15 MED ORDER — DEXAMETHASONE SODIUM PHOSPHATE 10 MG/ML IJ SOLN
INTRAMUSCULAR | Status: DC | PRN
  Administered 2024-02-15: 5 mg via INTRAVENOUS

## 2024-02-15 MED ORDER — SODIUM CHLORIDE 0.9 % IV SOLN: INTRAVENOUS | Status: DC

## 2024-02-15 MED ORDER — FENTANYL CITRATE (PF) 100 MCG/2ML IJ SOLN
INTRAMUSCULAR | Status: AC
  Filled 2024-02-15: qty 2

## 2024-02-15 MED ORDER — HEPARIN (PORCINE) IN NACL 1000-0.9 UT/500ML-% IV SOLN
INTRAVENOUS | Status: DC | PRN
  Administered 2024-02-15 (×3): 500 mL

## 2024-02-15 MED ORDER — ACETAMINOPHEN 500 MG PO TABS
1000.0000 mg | ORAL_TABLET | Freq: Once | ORAL | Status: AC
  Administered 2024-02-15: 1000 mg via ORAL
  Filled 2024-02-15: qty 2

## 2024-02-15 MED ORDER — PHENYLEPHRINE HCL-NACL 20-0.9 MG/250ML-% IV SOLN
INTRAVENOUS | Status: DC | PRN
  Administered 2024-02-15: 30 ug/min via INTRAVENOUS

## 2024-02-15 MED ORDER — ONDANSETRON HCL 4 MG/2ML IJ SOLN
INTRAMUSCULAR | Status: DC | PRN
  Administered 2024-02-15: 4 mg via INTRAVENOUS

## 2024-02-15 MED ORDER — PROTAMINE SULFATE 10 MG/ML IV SOLN
INTRAVENOUS | Status: DC | PRN
  Administered 2024-02-15: 40 mg via INTRAVENOUS

## 2024-02-15 MED ORDER — PROPOFOL 10 MG/ML IV BOLUS
INTRAVENOUS | Status: DC | PRN
  Administered 2024-02-15: 150 mg via INTRAVENOUS

## 2024-02-15 MED ORDER — HEPARIN SODIUM (PORCINE) 1000 UNIT/ML IJ SOLN
INTRAMUSCULAR | Status: DC | PRN
Start: 2024-02-15 — End: 2024-02-15
  Administered 2024-02-15: 8000 [IU] via INTRAVENOUS
  Administered 2024-02-15: 15000 [IU] via INTRAVENOUS

## 2024-02-15 MED ORDER — ATROPINE SULFATE 1 MG/10ML IJ SOSY
PREFILLED_SYRINGE | INTRAMUSCULAR | Status: AC
  Filled 2024-02-15: qty 10

## 2024-02-15 MED ORDER — ACETAMINOPHEN 325 MG PO TABS
650.0000 mg | ORAL_TABLET | ORAL | Status: DC | PRN
  Administered 2024-02-15: 650 mg via ORAL
  Filled 2024-02-15: qty 2

## 2024-02-15 MED ORDER — ROCURONIUM BROMIDE 10 MG/ML (PF) SYRINGE
PREFILLED_SYRINGE | INTRAVENOUS | Status: DC | PRN
  Administered 2024-02-15: 50 mg via INTRAVENOUS

## 2024-02-15 MED ORDER — PHENYLEPHRINE 80 MCG/ML (10ML) SYRINGE FOR IV PUSH (FOR BLOOD PRESSURE SUPPORT)
PREFILLED_SYRINGE | INTRAVENOUS | Status: DC | PRN
  Administered 2024-02-15 (×3): 80 ug via INTRAVENOUS

## 2024-02-15 MED ORDER — ATROPINE SULFATE 1 MG/10ML IJ SOSY
PREFILLED_SYRINGE | INTRAMUSCULAR | Status: DC | PRN
  Administered 2024-02-15: 1 mg via INTRAVENOUS

## 2024-02-15 MED ORDER — FENTANYL CITRATE (PF) 250 MCG/5ML IJ SOLN
INTRAMUSCULAR | Status: DC | PRN
  Administered 2024-02-15 (×2): 50 ug via INTRAVENOUS

## 2024-02-15 MED ORDER — LIDOCAINE 2% (20 MG/ML) 5 ML SYRINGE
INTRAMUSCULAR | Status: DC | PRN
  Administered 2024-02-15: 100 mg via INTRAVENOUS

## 2024-02-15 MED ORDER — SUGAMMADEX SODIUM 200 MG/2ML IV SOLN
INTRAVENOUS | Status: DC | PRN
  Administered 2024-02-15: 208.6 mg via INTRAVENOUS

## 2024-02-15 MED ORDER — ONDANSETRON HCL 4 MG/2ML IJ SOLN: 4.0000 mg | Freq: Four times a day (QID) | INTRAMUSCULAR | Status: DC | PRN

## 2024-02-15 SURGICAL SUPPLY — 24 items
BAG SNAP BAND KOVER 36X36 (MISCELLANEOUS) IMPLANT
CABLE PFA RX CATH CONN (CABLE) IMPLANT
CATH 8FR REPROCESSED SOUNDSTAR (CATHETERS) ×1 IMPLANT
CATH 8FR SOUNDSTAR REPROCESSED (CATHETERS) IMPLANT
CATH FARAWAVE ABLATION 31 (CATHETERS) IMPLANT
CATH OCTARAY 2.0 F 3-3-3-3-3 (CATHETERS) IMPLANT
CATH WEB BI DIR CSDF CRV REPRO (CATHETERS) IMPLANT
CLOSURE MYNX CONTROL 6F/7F (Vascular Products) IMPLANT
CLOSURE PERCLOSE PROSTYLE (VASCULAR PRODUCTS) IMPLANT
COVER SWIFTLINK CONNECTOR (BAG) ×1 IMPLANT
DILATOR VESSEL 38 20CM 16FR (INTRODUCER) IMPLANT
GUIDEWIRE INQWIRE 1.5J.035X260 (WIRE) IMPLANT
INQWIRE 1.5J .035X260CM (WIRE) ×1 IMPLANT
KIT VERSACROSS CNCT FARADRIVE (KITS) IMPLANT
MAT PREVALON FULL STRYKER (MISCELLANEOUS) IMPLANT
PACK EP LF (CUSTOM PROCEDURE TRAY) ×1 IMPLANT
PAD DEFIB RADIO PHYSIO CONN (PAD) ×1 IMPLANT
PATCH CARTO3 (PAD) IMPLANT
SHEATH FARADRIVE STEERABLE (SHEATH) IMPLANT
SHEATH INTROD W/O MIN 9FR 25CM (SHEATH) IMPLANT
SHEATH PINNACLE 8F 10CM (SHEATH) IMPLANT
SHEATH PINNACLE 9F 10CM (SHEATH) IMPLANT
SHEATH PROBE COVER 6X72 (BAG) IMPLANT
WIRE EMERALD 3MM-J .035X150CM (WIRE) IMPLANT

## 2024-02-15 NOTE — Progress Notes (Signed)
 1310 Patient ambulated to BR. Both groins are painful but soft and dressings CDI . Patient stated he did urinate.

## 2024-02-15 NOTE — Anesthesia Procedure Notes (Signed)
 Procedure Name: Intubation Date/Time: 02/15/2024 7:47 AM  Performed by: Cy Blamer, CRNAPre-anesthesia Checklist: Patient identified, Emergency Drugs available, Suction available and Patient being monitored Patient Re-evaluated:Patient Re-evaluated prior to induction Oxygen Delivery Method: Circle system utilized Preoxygenation: Pre-oxygenation with 100% oxygen Induction Type: IV induction Ventilation: Mask ventilation without difficulty Laryngoscope Size: Miller and 2 Grade View: Grade I Tube type: Oral Tube size: 7.5 mm Number of attempts: 1 Airway Equipment and Method: Stylet and Bite block Placement Confirmation: ETT inserted through vocal cords under direct vision, positive ETCO2 and breath sounds checked- equal and bilateral Secured at: 23 cm Tube secured with: Tape Dental Injury: Teeth and Oropharynx as per pre-operative assessment

## 2024-02-15 NOTE — Transfer of Care (Signed)
 Immediate Anesthesia Transfer of Care Note  Patient: Parker Daniel  Procedure(s) Performed: ATRIAL FIBRILLATION ABLATION  Patient Location: Cath Lab  Anesthesia Type:General  Level of Consciousness: awake, alert , and oriented  Airway & Oxygen Therapy: Patient Spontanous Breathing and Patient connected to nasal cannula oxygen  Post-op Assessment: Report given to RN, Post -op Vital signs reviewed and stable, Patient moving all extremities X 4, and Patient able to stick tongue midline  Post vital signs: Reviewed and stable  Last Vitals:  Vitals Value Taken Time  BP 129/87   Temp 98.6   Pulse 75 02/15/24 0913  Resp 17 02/15/24 0913  SpO2 97 % 02/15/24 0913  Vitals shown include unfiled device data.  Last Pain:  Vitals:   02/15/24 0606  TempSrc:   PainSc: 0-No pain         Complications: There were no known notable events for this encounter.

## 2024-02-15 NOTE — Discharge Instructions (Signed)

## 2024-02-18 ENCOUNTER — Telehealth (HOSPITAL_COMMUNITY): Payer: Self-pay

## 2024-02-18 MED FILL — Fentanyl Citrate Preservative Free (PF) Inj 100 MCG/2ML: INTRAMUSCULAR | Qty: 2 | Status: AC

## 2024-02-18 NOTE — Anesthesia Postprocedure Evaluation (Signed)
 Anesthesia Post Note  Patient: Parker Daniel  Procedure(s) Performed: ATRIAL FIBRILLATION ABLATION     Patient location during evaluation: PACU Anesthesia Type: General Level of consciousness: awake and alert Pain management: pain level controlled Vital Signs Assessment: post-procedure vital signs reviewed and stable Respiratory status: spontaneous breathing, nonlabored ventilation, respiratory function stable and patient connected to nasal cannula oxygen Cardiovascular status: blood pressure returned to baseline and stable Postop Assessment: no apparent nausea or vomiting Anesthetic complications: no  There were no known notable events for this encounter.  Last Vitals:  Vitals:   02/15/24 1257 02/15/24 1300  BP: 135/88 135/88  Pulse: 80 85  Resp: 12 (!) 8  Temp:    SpO2: 97% 96%    Last Pain:  Vitals:   02/15/24 1216  TempSrc:   PainSc: 5                  Tyrihanna Wingert L Hagen Bohorquez

## 2024-02-18 NOTE — Telephone Encounter (Signed)
 Per Dr. Elberta Fortis, There are many nerve bundles that run around the groin site. He could have some inflammation. If he is okay to continue to monitor, I think that that would be a reasonable option. If he would like to be evaluated, he can be seen in A-fib clinic.   Spoke with patient and informed him of provider recommendations. Patient opted to continue to monitor at this time and if symptoms continue or worsen, he will contact the office to schedule an appointment for further evaluation with the Afib clinic.

## 2024-02-18 NOTE — Telephone Encounter (Signed)
 Spoke with patient to complete post procedure follow up call.  Patient removed large bandage at puncture site after 24 hours. Reports he has experienced post procedure a sharp, shooting and burning pain that starts from right groin down his inner thigh when ambulating. Reports it started when he got up after his bedrest in recovery. He takes Oxy IR for his back and this has decreased but not relieved the pain. He denies any associated symptoms. Will make provider aware and to advise on additional recommendations.   Instructions reviewed with patient:  It is normal to have bruising, tenderness and a pea or marble sized lump/knot at the groin site which can take up to three months to resolve.  Get help right away if you notice sudden swelling at the puncture site.  Check your puncture site every day for signs of infection: fever, redness, swelling, pus drainage, warmth, foul odor or excessive pain. If this occurs, please call the office at [phone], to speak with the nurse. Get help right away if your puncture site is bleeding and the bleeding does not stop after applying firm pressure to the area.  You may continue to have skipped beats/ atrial fibrillation during the first several months after your procedure.  It is very important not to miss any doses of your blood thinner Eliquis. Patient restarted taking this medication on 02/15/24.   You will follow up with the Afib clinic on 03/14/24 and follow up with the APP on 05/20/24.   Patient verbalized understanding to all instructions provided.

## 2024-02-20 ENCOUNTER — Telehealth: Payer: Self-pay | Admitting: Cardiovascular Disease

## 2024-02-20 MED ORDER — ONDANSETRON HCL 4 MG PO TABS: 4.0000 mg | ORAL_TABLET | Freq: Three times a day (TID) | ORAL | 0 refills | Status: DC | PRN

## 2024-02-20 NOTE — Telephone Encounter (Signed)
 STAT if patient feels like he/she is going to faint   1. Are you feeling dizzy, lightheaded, or faint right now?   No  2. Have you passed out?  No (If yes move to .SYNCOPECH[facility])  3. Do you have any other symptoms? Nauseous, vomiting  4. Have you checked your HR and BP (record if available)?     Today - BP 142/86  HR 75   Patient stated since he had his procedure on Monday he started feeling nauseous and vomitting. Patient wants advice on next steps.

## 2024-02-20 NOTE — Telephone Encounter (Signed)
 Patient identification verified by 2 forms. Shade Flood, RN     Called and spoke to patient  Patient states:  - experiencing nausea and vomiting since yesterday - he can eat and drink but has not had an appetite today due to nausea.  - BP today 142/86 HR 75  - he would like a medication sent to help with the nausea.   Patient denies:  - SOB, dizziness, vision changes, chest pain, fever, signs of infection, bleeding or swelling at wound site.              Interventions/Plan: Instructions reviewed with patient:  Some nausea and vomiting is a common symptom following this procedure. Contact our office if unable to keep food or drink down.  Get help right away if you notice sudden swelling at the puncture site.  Check your puncture site every day for signs of infection: fever, redness, swelling, pus drainage, warmth, foul odor or excessive pain. If this occurs, please call the office at [phone], to speak with the nurse. Get help right away if your puncture site is bleeding and the bleeding does not stop after applying firm pressure to the area.  You may continue to have skipped beats/ atrial fibrillation during the first several months after your procedure.  It is very important not to miss any doses of your blood thinner Eliquis.    Patient verbalized understanding to all instructions provided. No further questions at this time.

## 2024-02-20 NOTE — Telephone Encounter (Signed)
 Patient identification verified by 2 forms. Shade Flood, RN     Patient called back to follow up on nausea medicine.  Per Dr. Elberta Fortis patient to start ZOFRAN 4 mg by mouth q8hrs as needed for nausea. Prescription sent .    Called patient and informed him of changes. Patient verbalized understanding. No further questions at this time.

## 2024-02-24 NOTE — Progress Notes (Unsigned)
 Cardiology Office Note:  .   Date:  02/25/2024  ID:  Parker Daniel, DOB 10-14-1956, MRN [id number] PCP: System, Provider Not In  [facility] Providers Cardiologist:  Oneil Bigness, MD Electrophysiologist:  Lei Pump, MD    History of Present Illness: .    Chief Complaint  Patient presents with   Follow-up         Parker Daniel is a 68 y.o. male with history of Afib s/p ablation, DM, HLD, HTN who presents for follow-up. S/p Afib ablation.    History of Present Illness   Parker Daniel is a 68 year old male with atrial fibrillation, nonobstructive CAD, HLD status post ablation who presents for follow-up.  He recently underwent an AFib ablation and is currently maintaining sinus rhythm. No chest pain or dyspnea is present. He is gradually resuming his activity level and starting to exercise.  He is taking Multaq 400 mg twice daily and Eliquis 5 mg twice daily.  Regarding his nonobstructive coronary artery disease, he reports no symptoms of chest pain. He is on Zetia 10 mg daily for hyperlipidemia due to statin intolerance. His most recent cholesterol levels are LDL 67 mg/dL, HDL 37 mg/dL, and total cholesterol 117 mg/dL.  He has a history of a gastrointestinal bleed but reports no current issues or recurrence of bleeding. He is followed by a gastroenterologist for this condition.  His blood pressure is well controlled. His primary care doctor is Dr. Margret Shepherd, located in the [place] area.          Problem List Atrial fibrillation  -PVI 02/15/2024 2. Coronary calcium  -258 (74th percentile) -mild stenosis (25-49%) 3. DM -A1c 6.6 4. HLD -T chol 117, TG 56, HDL 37, LDL 67 5. HTN 6. GI Bleed     ROS: All other ROS reviewed and negative. Pertinent positives noted in the HPI.     Studies Reviewed: Parker Daniel   EKG Interpretation Date/Time:  Monday February 25 2024 10:52:52 EDT Ventricular Rate:  60 PR Interval:  182 QRS Duration:  92 QT Interval:  412 QTC  Calculation: 412 R Axis:   4  Text Interpretation: Normal sinus rhythm with sinus arrhythmia Normal ECG Confirmed by Jackquelyn Mass [phone]) on 02/25/2024 10:58:24 AM   TTE 01/02/2023  1. Left ventricular ejection fraction, by estimation, is 60 to 65%. The  left ventricle has normal function. The left ventricle has no regional  wall motion abnormalities. Left ventricular diastolic parameters were  normal.   2. Right ventricular systolic function is normal. The right ventricular  size is normal.   3. The mitral valve is normal in structure. No evidence of mitral valve  regurgitation. No evidence of mitral stenosis.   4. The aortic valve is normal in structure. Aortic valve regurgitation is  not visualized. No aortic stenosis is present.   5. The inferior vena cava is normal in size with greater than 50%  respiratory variability, suggesting right atrial pressure of 3 mmHg.   CCTA 12/23/2022 IMPRESSION: 1. Mild non-obstructive CAD, CADRADS = 2. See comments re: Visual stenosis in distal RCA that is more consistent with slab artifact.   2. Coronary calcium score of 258. This was 74th percentile for age and sex matched control.   3. Normal coronary origin with right dominance. Physical Exam:   VS:  BP 122/80 (BP Location: Left Arm, Patient Position: Sitting)   Pulse 60   Ht 5\' 10"  (1.778 m)   Wt 237 lb 6.4 oz (  107.7 kg)   SpO2 97%   BMI 34.06 kg/m    Wt Readings from Last 3 Encounters:  02/25/24 237 lb 6.4 oz (107.7 kg)  02/15/24 230 lb (104.3 kg)  12/25/23 231 lb (104.8 kg)    GEN: Well nourished, well developed in no acute distress NECK: No JVD; No carotid bruits CARDIAC: RRR, no murmurs, rubs, gallops RESPIRATORY:  Clear to auscultation without rales, wheezing or rhonchi  ABDOMEN: Soft, non-tender, non-distended EXTREMITIES:  No edema; No deformity  ASSESSMENT AND PLAN: .   Assessment and Plan    Atrial Fibrillation (AFib) Status post AFib ablation, maintaining sinus  rhythm. On Multaq, expected to discontinue post-electrophysiologist follow-up. Eliquis to continue indefinitely for thromboembolic prevention. - Continue Eliquis 5 mg BID. - Discuss discontinuation of Multaq with electrophysiologist at follow-up. - Encourage regular exercise.  Nonobstructive Coronary Artery Disease (CAD) Cholesterol levels controlled with Zetia due to statin intolerance. - Continue Zetia 10 mg daily. - Monitor cholesterol levels regularly.  Hyperlipidemia Cholesterol levels controlled with Zetia due to statin intolerance. - Continue Zetia 10 mg daily. - Monitor cholesterol levels regularly.  Gastrointestinal Bleed No current issues. Watchman device considered if bleeding recurs. - Monitor for signs of gastrointestinal bleeding. - Consider Watchman device if bleeding recurs.              Follow-up: Return in about 1 year (around 02/24/2025).  Signed, Gigi Kyle. Rolm Clos, MD, [facility]  [facility]  [address] [place], [place] [address] [phone]  11:06 AM

## 2024-02-25 ENCOUNTER — Encounter: Payer: Self-pay | Admitting: Cardiovascular Disease

## 2024-02-25 ENCOUNTER — Ambulatory Visit: Payer: Medicare HMO | Attending: Cardiovascular Disease | Admitting: Cardiovascular Disease

## 2024-02-25 VITALS — BP 122/80 | HR 60 | Ht 70.0 in | Wt 237.4 lb

## 2024-02-25 DIAGNOSIS — I2510 Atherosclerotic heart disease of native coronary artery without angina pectoris: Secondary | ICD-10-CM

## 2024-02-25 DIAGNOSIS — I150 Renovascular hypertension: Secondary | ICD-10-CM | POA: Diagnosis not present

## 2024-02-25 DIAGNOSIS — I4819 Other persistent atrial fibrillation: Secondary | ICD-10-CM

## 2024-02-25 DIAGNOSIS — E782 Mixed hyperlipidemia: Secondary | ICD-10-CM | POA: Diagnosis not present

## 2024-02-25 NOTE — Patient Instructions (Signed)
 Medication Instructions:   Your physician recommends that you continue on your current medications as directed. Please refer to the Current Medication list given to you today.   *If you need a refill on your cardiac medications before your next appointment, please call your pharmacy*   Lab Work: None    If you have labs (blood work) drawn today and your tests are completely normal, you will receive your results only by: MyChart Message (if you have MyChart) OR A paper copy in the mail If you have any lab test that is abnormal or we need to change your treatment, we will call you to review the results.   Testing/Procedures: None    Follow-Up: At [facility], you and your health needs are our priority.  As part of our continuing mission to provide you with exceptional heart care, we have created designated Provider Care Teams.  These Care Teams include your primary Cardiologist (physician) and Advanced Practice Providers (APPs -  Physician Assistants and Nurse Practitioners) who all work together to provide you with the care you need, when you need it.  We recommend signing up for the patient portal called "MyChart".  Sign up information is provided on this After Visit Summary.  MyChart is used to connect with patients for Virtual Visits (Telemedicine).  Patients are able to view lab/test results, encounter notes, upcoming appointments, etc.  Non-urgent messages can be sent to your provider as well.   To learn more about what you can do with MyChart, go to [email or web address].    Your next appointment:   1 year(s)  The format for your next appointment:   In Person  Provider:   Edd Fabian, FNP, Micah Flesher, PA-C, Marjie Skiff, PA-C, Robet Leu, PA-C, Azalee Course, PA-C, Bernadene Person, NP, or Reather Littler, NP     Other Instructions

## 2024-02-28 ENCOUNTER — Encounter: Payer: Self-pay | Admitting: Emergency Medicine

## 2024-03-14 ENCOUNTER — Encounter (HOSPITAL_COMMUNITY): Payer: Self-pay

## 2024-03-14 ENCOUNTER — Ambulatory Visit (HOSPITAL_COMMUNITY): Attending: Internal Medicine | Admitting: Internal Medicine

## 2024-03-14 DIAGNOSIS — I4891 Unspecified atrial fibrillation: Secondary | ICD-10-CM | POA: Diagnosis not present

## 2024-03-24 ENCOUNTER — Ambulatory Visit (HOSPITAL_COMMUNITY)
Admission: RE | Admit: 2024-03-24 | Discharge: 2024-03-24 | Disposition: A | Discharge status: Home / Self Care | Source: Ambulatory Visit | Attending: Internal Medicine | Admitting: Internal Medicine

## 2024-03-24 VITALS — BP 138/88 | HR 61 | Ht 70.0 in | Wt 240.4 lb

## 2024-03-24 DIAGNOSIS — Z5181 Encounter for therapeutic drug level monitoring: Secondary | ICD-10-CM

## 2024-03-24 DIAGNOSIS — I4891 Unspecified atrial fibrillation: Secondary | ICD-10-CM

## 2024-03-24 DIAGNOSIS — Z79899 Other long term (current) drug therapy: Secondary | ICD-10-CM

## 2024-03-24 DIAGNOSIS — I4819 Other persistent atrial fibrillation: Secondary | ICD-10-CM

## 2024-03-24 DIAGNOSIS — D6869 Other thrombophilia: Secondary | ICD-10-CM

## 2024-03-24 NOTE — Progress Notes (Signed)
 Primary Care Physician: System, Provider Not In Primary Cardiologist: Oneil Bigness, MD Electrophysiologist: Will Cortland Ding, MD     Referring Physician: Dr. Tor Freed is a 68 y.o. 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 Patient is on Eliquis  for a CHADS2VASC score of 4.  On evaluation today, patient is currently in NSR. S/p Afib ablation on 02/15/24 by Dr. Lawana Pray. Brief episode of Afib 2 days ago since ablation. No chest pain or SOB. Leg sites healed without issue. No missed doses of anticoagulant.  Today, he denies symptoms of orthopnea, PND, lower extremity edema, dizziness, presyncope, syncope, snoring, daytime somnolence, bleeding, or neurologic sequela. The patient is tolerating medications without difficulties and is otherwise without complaint today.    he has a BMI of Body mass index is 34.49 kg/m.Aaron Aas Filed Weights   03/24/24 1129  Weight: 109 kg    Current Outpatient Medications  Medication Sig Dispense Refill   acetaminophen  (TYLENOL ) 500 MG tablet Take 1,000 mg by mouth as needed for moderate pain (pain score 4-6).     allopurinol  (ZYLOPRIM ) 300 MG tablet Take 300 mg by mouth in the morning.     ELIQUIS  5 MG TABS tablet Take 1 tablet (5 mg total) by mouth 2 (two) times daily.     ezetimibe (ZETIA) 10 MG tablet Take 10 mg by mouth in the morning.     FEROSUL 325 (65 Fe) MG tablet Take 325 mg by mouth 3 (three) times a week.     linaclotide (LINZESS) 72 MCG capsule Take 72 mcg by mouth daily.     MULTAQ  400 MG tablet Take 400 mg by mouth 2 (two) times daily. 6pm and 6 am     oxyCODONE  (OXY IR/ROXICODONE ) 5 MG immediate release tablet Take 5 mg by mouth in the morning, at noon, in the evening, and at bedtime.     pantoprazole  (PROTONIX ) 40 MG tablet Take 1 tablet (40 mg total) by mouth 2 (two) times daily. 60 tablet 11    pregabalin  (LYRICA ) 75 MG capsule Take 75 mg by mouth 2 (two) times daily.     senna (SENOKOT) 8.6 MG tablet Take 2-3 tablets by mouth daily as needed for constipation.     tamsulosin  (FLOMAX ) 0.4 MG CAPS capsule Take 0.4 mg by mouth in the morning.     testosterone cypionate (DEPOTESTOSTERONE CYPIONATE) 200 MG/ML injection Inject 200 mg into the muscle every 21 ( twenty-one) days.     traZODone  (DESYREL ) 50 MG tablet Take 50 mg by mouth at bedtime as needed for sleep.     No current facility-administered medications for this encounter.    Atrial Fibrillation Management history:  Previous antiarrhythmic drugs: Multaq  Previous cardioversions: none Previous ablations: 02/15/24 Anticoagulation history: Eliquis    ROS- All systems are reviewed and negative except as per the HPI above.  Physical Exam: BP 138/88   Pulse 61   Ht 5\' 10"  (1.778 m)   Wt 109 kg   BMI 34.49 kg/m   GEN: Well nourished, well developed in no acute distress NECK: No JVD; No carotid bruits CARDIAC: Regular rate and rhythm, no murmurs, rubs, gallops RESPIRATORY:  Clear to auscultation without rales, wheezing or rhonchi  ABDOMEN: Soft, non-tender, non-distended EXTREMITIES:  No edema; No deformity   EKG today demonstrates  Vent. rate 61 BPM PR interval 212 ms QRS  duration 94 ms QT/QTcB 430/432 ms P-R-T axes 31 -9 17 Sinus rhythm with 1st degree A-V block Otherwise normal ECG When compared with ECG of 25-Feb-2024 10:52, PR interval has increased  Echo 01/02/23 demonstrated  1. Left ventricular ejection fraction, by estimation, is 60 to 65%. The  left ventricle has normal function. The left ventricle has no regional  wall motion abnormalities. Left ventricular diastolic parameters were  normal.   2. Right ventricular systolic function is normal. The right ventricular  size is normal.   3. The mitral valve is normal in structure. No evidence of mitral valve  regurgitation. No evidence of mitral  stenosis.   4. The aortic valve is normal in structure. Aortic valve regurgitation is  not visualized. No aortic stenosis is present.   5. The inferior vena cava is normal in size with greater than 50%  respiratory variability, suggesting right atrial pressure of 3 mmHg.   ASSESSMENT & PLAN CHA2DS2-VASc Score = 4  The patient's score is based upon: CHF History: 0 HTN History: 1 Diabetes History: 1 Stroke History: 0 Vascular Disease History: 1 Age Score: 1 Gender Score: 0       ASSESSMENT AND PLAN: Persistent Atrial Fibrillation (ICD10:  I48.19) The patient's CHA2DS2-VASc score is 4, indicating a 4.8% annual risk of stroke.   S/p Afib ablation on 02/15/24 by Dr. Lawana Pray.  He is currently in NSR.   Secondary Hypercoagulable State (ICD10:  D68.69) The patient is at significant risk for stroke/thromboembolism based upon his CHA2DS2-VASc Score of 4.  Continue Apixaban  (Eliquis ).  Continue Eliquis  5 mg BID without interruption.  High risk medication monitoring (ICD10: J342684) Patient requires ongoing monitoring for anti-arrhythmic medication which has the potential to cause life threatening arrhythmias or AV block. ECG intervals are stable. Continue Multaq  400 mg BID.    Follow up with EP as scheduled.    Minnie Amber, PA-C  Afib Clinic [facility] [address] [place], [place] [address] [phone]

## 2024-05-18 NOTE — Progress Notes (Unsigned)
 Cardiology Office Note:  .   Date:  05/18/2024  ID:  ABDALLAH HERN, DOB September 30, 1956, MRN [id number] PCP: System, Provider Not In  [facility] Providers Cardiologist:  Darryle ONEIDA Decent, MD Electrophysiologist:  Soyla Gladis Norton, MD {  History of Present Illness: .   ABDULHAMID OLGIN is a 68 y.o. male w/PMHx of  HTN, HLD, DM GIB Non-obst CAD by CTa 2024 AFib  AFib ablation on 02/15/24  He saw Dr. Burton on 02/25/24, maintaining SR, no signs of bleeding (given hx of GIB), discussed consideration of watchman if recurrent GIB  He saw the AFib clinic team 03/24/24, again in SR, no procedural concerns   Today's visit is scheduled as his post ablation visit ROS:   *** stop multaq  *** symptoms *** eliquis , dose, bleeding, labs *** score is 3    Arrhythmia/AAD hx Multaq  (pre-dates his [facility] chart looks about 2022) AFib ablation 02/15/24  Studies Reviewed: SABRA    EKG done today and reviewed by myself:  ***  02/15/24: EPS/ablation CONCLUSIONS: 1. Sinus rhythm upon presentation.   2. Successful electrical isolation and anatomical encircling of all four pulmonary veins with pulse field ablation. 3. Ablation of posterior wall with pulse field ablation 4. No early apparent complications.   01/23/24: cardiac CT IMPRESSION: 1. There is normal pulmonary vein drainage into the left atrium with ostial measurements above. 2. There is no thrombus in the left atrial appendage. 3. The esophagus runs to the left of the atrial midline and is in proximity to the left pulmonary vein ostia. 4. No PFO/ASD. 5. Normal coronary origin. Right dominance. 6. CAC score of 176 which is 65th percentile for age-, race-, and sex-matched controls.    01/02/23: TTE 1. Left ventricular ejection fraction, by estimation, is 60 to 65%. The  left ventricle has normal function. The left ventricle has no regional  wall motion abnormalities. Left ventricular diastolic parameters were  normal.   2. Right  ventricular systolic function is normal. The right ventricular  size is normal.   3. The mitral valve is normal in structure. No evidence of mitral valve  regurgitation. No evidence of mitral stenosis.   4. The aortic valve is normal in structure. Aortic valve regurgitation is  not visualized. No aortic stenosis is present.   5. The inferior vena cava is normal in size with greater than 50%  respiratory variability, suggesting right atrial pressure of 3 mmHg.    12/21/22: coronary CTa INTERPRETATION: CAD-RADS 2: Mild non-obstructive CAD (25-49%). Consider non-atherosclerotic causes of chest pain. Consider preventive therapy and risk factor modification.   Risk Assessment/Calculations:    Physical Exam:   VS:  There were no vitals taken for this visit.   Wt Readings from Last 3 Encounters:  03/24/24 240 lb 6.4 oz (109 kg)  02/25/24 237 lb 6.4 oz (107.7 kg)  02/15/24 230 lb (104.3 kg)    GEN: Well nourished, well developed in no acute distress NECK: No JVD; No carotid bruits CARDIAC: ***RRR, no murmurs, rubs, gallops RESPIRATORY:  *** CTA b/l without rales, wheezing or rhonchi  ABDOMEN: Soft, non-tender, non-distended EXTREMITIES: *** No edema; No deformity   ASSESSMENT AND PLAN: .    persistent AFib CHA2DS2Vasc is 3, on Eloiquis, *** appropriately dosed *** burden post ablation *** multaq   HTN ***  Secondary hypercoagulable state 2/2 AFib     {Are you ordering a CV Procedure (e.g. stress test, cath, DCCV, TEE, etc)?   Press F2        :  [id number]}     Dispo: ***  Signed, Charlies Macario Arthur, PA-C

## 2024-05-20 ENCOUNTER — Ambulatory Visit: Attending: Cardiology | Admitting: Physician Assistant

## 2024-05-20 ENCOUNTER — Encounter: Payer: Self-pay | Admitting: Physician Assistant

## 2024-05-20 VITALS — BP 110/74 | HR 76 | Ht 70.0 in | Wt 248.4 lb

## 2024-05-20 DIAGNOSIS — D6869 Other thrombophilia: Secondary | ICD-10-CM

## 2024-05-20 DIAGNOSIS — I10 Essential (primary) hypertension: Secondary | ICD-10-CM

## 2024-05-20 DIAGNOSIS — I4819 Other persistent atrial fibrillation: Secondary | ICD-10-CM

## 2024-05-20 NOTE — Patient Instructions (Signed)
 Medication Instructions:   STOP TAKING:  MULTAQ     *If you need a refill on your cardiac medications before your next appointment, please call your pharmacy*   Lab Work:  NONE ORDERED  TODAY    If you have labs (blood work) drawn today and your tests are completely normal, you will receive your results only by: MyChart Message (if you have MyChart) OR A paper copy in the mail If you have any lab test that is abnormal or we need to change your treatment, we will call you to review the results.  Testing/Procedures: NONE ORDERED  TODAY    Follow-Up: At [facility], you and your health needs are our priority.  As part of our continuing mission to provide you with exceptional heart care, our providers are all part of one team.  This team includes your primary Cardiologist (physician) and Advanced Practice Providers or APPs (Physician Assistants and Nurse Practitioners) who all work together to provide you with the care you need, when you need it.  Your next appointment:    6 month(s)   Provider:   You may see Will Gladis Norton, MD  or one of the following Advanced Practice Providers on your designated Care Team:   Charlies Arthur, [place]  We recommend signing up for the patient portal called MyChart.  Sign up information is provided on this After Visit Summary.  MyChart is used to connect with patients for Virtual Visits (Telemedicine).  Patients are able to view lab/test results, encounter notes, upcoming appointments, etc.  Non-urgent messages can be sent to your provider as well.   To learn more about what you can do with MyChart, go to [email or web address].    Other Instructions

## 2024-07-29 ENCOUNTER — Telehealth: Payer: Self-pay

## 2024-07-29 NOTE — Telephone Encounter (Signed)
  Patient Consent for Virtual Visit        [name] has provided verbal consent on 07/29/2024 for a virtual visit (video or telephone).   CONSENT FOR VIRTUAL VISIT FOR:  Parker Daniel  By participating in this virtual visit I agree to the following:  I hereby voluntarily request, consent and authorize [facility] and its employed or contracted physicians, physician assistants, nurse practitioners or other licensed health care professionals (the Practitioner), to provide me with telemedicine health care services (the "Services) as deemed necessary by the treating Practitioner. I acknowledge and consent to receive the Services by the Practitioner via telemedicine. I understand that the telemedicine visit will involve communicating with the Practitioner through live audiovisual communication technology and the disclosure of certain medical information by electronic transmission. I acknowledge that I have been given the opportunity to request an in-person assessment or other available alternative prior to the telemedicine visit and am voluntarily participating in the telemedicine visit.  I understand that I have the right to withhold or withdraw my consent to the use of telemedicine in the course of my care at any time, without affecting my right to future care or treatment, and that the Practitioner or I may terminate the telemedicine visit at any time. I understand that I have the right to inspect all information obtained and/or recorded in the course of the telemedicine visit and may receive copies of available information for a reasonable fee.  I understand that some of the potential risks of receiving the Services via telemedicine include:  Delay or interruption in medical evaluation due to technological equipment failure or disruption; Information transmitted may not be sufficient (e.g. poor resolution of images) to allow for appropriate medical decision making by the Practitioner;  and/or  In rare instances, security protocols could fail, causing a breach of personal health information.  Furthermore, I acknowledge that it is my responsibility to provide information about my medical history, conditions and care that is complete and accurate to the best of my ability. I acknowledge that Practitioner's advice, recommendations, and/or decision may be based on factors not within their control, such as incomplete or inaccurate data provided by me or distortions of diagnostic images or specimens that may result from electronic transmissions. I understand that the practice of medicine is not an exact science and that Practitioner makes no warranties or guarantees regarding treatment outcomes. I acknowledge that a copy of this consent can be made available to me via my patient portal [facility] MyChart), or I can request a printed copy by calling the office of [facility].    I understand that my insurance will be billed for this visit.   I have read or had this consent read to me. I understand the contents of this consent, which adequately explains the benefits and risks of the Services being provided via telemedicine.  I have been provided ample opportunity to ask questions regarding this consent and the Services and have had my questions answered to my satisfaction. I give my informed consent for the services to be provided through the use of telemedicine in my medical care

## 2024-07-29 NOTE — Telephone Encounter (Signed)
 Patient with diagnosis of A Fib on Eliquis  for anticoagulation.  Ablation on 02/15/24  Procedure: endoscopy Date of procedure: 08/25/24   CHA2DS2-VASc Score = 4  This indicates a 4.8% annual risk of stroke. The patient's score is based upon: CHF History: 1 HTN History: 0 Diabetes History: 1 Stroke History: 0 Vascular Disease History: 1 Age Score: 1 Gender Score: 0    CrCl 118 ml/min Platelet count 219K  Patient has not  had an Afib/aflutter ablation or Watchman within the last 3 months or DCCV within the last 30 days    Per office protocol, patient can hold Eliquis  for 2 days prior to procedure.      **This guidance is not considered finalized until pre-operative APP has relayed final recommendations.**

## 2024-07-29 NOTE — Telephone Encounter (Signed)
   Name: Parker Daniel  DOB: [birth year]  MRN: [id number]  Primary Cardiologist: Darryle ONEIDA Decent, MD  Preoperative team, please contact this patient and set up a phone call appointment for further preoperative risk assessment. Please obtain consent and complete medication review. Thank you for your help.  I confirm that guidance regarding antiplatelet and oral anticoagulation therapy has been completed and, if necessary, noted below.  Per office protocol, patient can hold Eliquis  for 2 days prior to procedure.   I also confirmed the patient resides in the state of [place] . As per [facility] [employer] telemedicine laws, the patient must reside in the state in which the provider is licensed.   Kaelene Elliston E Debera Sterba, NP 07/29/2024, 4:14 PM [facility]

## 2024-07-29 NOTE — Telephone Encounter (Signed)
 Preop tele appt now scheduled, med rec and consent done.

## 2024-07-29 NOTE — Telephone Encounter (Signed)
   Pre-operative Risk Assessment    Patient Name: Parker Daniel  DOB: [birth year] MRN: [id number]   Date of last office visit: 05/20/24 Date of next office visit: Not scheduled   Request for Surgical Clearance    Procedure:  endoscopy  Date of Surgery:  Clearance 08/25/24                                Surgeon:  Dr. Elsie Cree Surgeon's Group or Practice Name:  [facility] Phone number:  [phone] Fax number:  [phone]   Type of Clearance Requested:   - Medical  - Pharmacy:  Hold Apixaban  (Eliquis )     Type of Anesthesia:  Propofol    Additional requests/questions:    [name]   07/29/2024, 10:17 AM

## 2024-08-13 ENCOUNTER — Encounter: Payer: Self-pay | Admitting: Nurse Practitioner

## 2024-08-13 ENCOUNTER — Telehealth: Payer: Self-pay | Admitting: Nurse Practitioner

## 2024-08-13 ENCOUNTER — Ambulatory Visit: Attending: Internal Medicine | Admitting: Nurse Practitioner

## 2024-08-13 DIAGNOSIS — Z01810 Encounter for preprocedural cardiovascular examination: Secondary | ICD-10-CM

## 2024-08-13 NOTE — Progress Notes (Signed)
 Virtual Visit via Telephone Note   Because of Parker Daniel co-morbid illnesses, he is at least at moderate risk for complications without adequate follow up.  This format is felt to be most appropriate for this patient at this time.  Due to technical limitations with video connection (technology), today's appointment will be conducted as an audio only telehealth visit, and Parker Daniel verbally agreed to proceed in this manner.   All issues noted in this document were discussed and addressed.  No physical exam could be performed with this format.  Evaluation Performed:  Preoperative cardiovascular risk assessment _____________   Date:  08/13/2024   Patient ID:  Parker Daniel, DOB Dec 28, 1955, MRN [id number] Patient Location:  Home Provider location:   Office  Primary Care Provider:  System, Provider Not In Primary Cardiologist:  Darryle ONEIDA Decent, MD  Chief Complaint / Patient Profile   68 y.o. y/o male with a h/o atrial fib ablation 02/15/24, diabetes, hyperlipidemia, mild nonobstructive CAD, HTN who is pending endoscopy on 08/25/24 and presents today for telephonic preoperative cardiovascular risk assessment.  History of Present Illness    Parker Daniel is a 68 y.o. male who presents via audio/video conferencing for a telehealth visit today.  Pt was last seen in cardiology clinic on 05/20/24 by Charlies Arthur, PA.  At that time Parker Daniel was doing well.  The patient is now pending procedure as outlined above. Since his last visit, he  denies chest pain, shortness of breath, lower extremity edema, fatigue, palpitations, melena, hematuria, hemoptysis, diaphoresis, weakness, presyncope, syncope, orthopnea, and PND. He is active at home with walking and yard work and is able to achieve > 4 METS activity without concerning cardiac symptoms.    Past Medical History    Past Medical History:  Diagnosis Date   Arthritis    Coronary artery disease    Diabetes mellitus without complication (HCC)     Dyspnea    with exertion   Dysrhythmia    GERD (gastroesophageal reflux disease)    Gout    History of kidney stones    Hyperlipidemia    Hypertension    Pneumonia    10/2021   Past Surgical History:  Procedure Laterality Date   APPENDECTOMY     age 30   ATRIAL FIBRILLATION ABLATION N/A 02/15/2024   Procedure: ATRIAL FIBRILLATION ABLATION;  Surgeon: Inocencio Soyla Lunger, MD;  Location: [facility];  Service: Cardiovascular;  Laterality: N/A;   BIOPSY  10/17/2023   Procedure: BIOPSY;  Surgeon: Burnette Fallow, MD;  Location: [facility];  Service: Gastroenterology;;   CARDIOVERSION N/A 06/11/2023   Procedure: CARDIOVERSION;  Surgeon: Francyne Headland, MD;  Location: [facility];  Service: Cardiovascular;  Laterality: N/A;   CHOLECYSTECTOMY     age 33   CYSTOSCOPY/URETEROSCOPY/HOLMIUM LASER/STENT PLACEMENT Right 01/24/2022   Procedure: CYSTOSCOPY/RETROGRADE/URETEROSCOPY/HOLMIUM LASER/STENT PLACEMENT;  Surgeon: Nieves Cough, MD;  Location: [facility];  Service: Urology;  Laterality: Right;   ESOPHAGOGASTRODUODENOSCOPY (EGD) WITH PROPOFOL  Left 10/17/2023   Procedure: ESOPHAGOGASTRODUODENOSCOPY (EGD) WITH PROPOFOL ;  Surgeon: Burnette Fallow, MD;  Location: [facility];  Service: Gastroenterology;  Laterality: Left;   KNEE SURGERY Right    total knee   SURGERY SCROTAL / TESTICULAR     age 42   THULIUM LASER TURP (TRANSURETHRAL RESECTION OF PROSTATE) N/A 01/24/2022   Procedure: THULIUM LASER TURP (TRANSURETHRAL RESECTION OF PROSTATE);  Surgeon: Nieves Cough, MD;  Location: [facility];  Service: Urology;  Laterality: N/A;   TONSILLECTOMY  TOTAL HIP ARTHROPLASTY Left 06/27/2019   Procedure: LEFT TOTAL HIP ARTHROPLASTY ANTERIOR APPROACH;  Surgeon: Vernetta Lonni GRADE, MD;  Location: [facility];  Service: Orthopedics;  Laterality: Left;   TOTAL KNEE ARTHROPLASTY Right 03/17/2022   Procedure: RIGHT TOTAL KNEE ARTHROPLASTY;  Surgeon: Vernetta Lonni GRADE, MD;  Location:  [facility];  Service: Orthopedics;  Laterality: Right;   TRANSURETHRAL RESECTION OF PROSTATE N/A 12/25/2023   Procedure: TRANSURETHRAL RESECTION OF THE PROSTATE (TURP);  Surgeon: Nieves Cough, MD;  Location: [facility];  Service: Urology;  Laterality: N/A;  60 MINUTE CASE    Allergies  Allergies  Allergen Reactions   Atorvastatin  Other (See Comments)    Other reaction(s): Myalgia Takes pravastatin  at home   Rosuvastatin Other (See Comments)    Other reaction(s): Myalgia Takes pravastatin  at home    Home Medications    Prior to Admission medications   Medication Sig Start Date End Date Taking? Authorizing Provider  acetaminophen  (TYLENOL ) 500 MG tablet Take 1,000 mg by mouth as needed for moderate pain (pain score 4-6).    [provider]  allopurinol  (ZYLOPRIM ) 300 MG tablet Take 300 mg by mouth in the morning.    [provider]  ELIQUIS  5 MG TABS tablet Take 1 tablet (5 mg total) by mouth 2 (two) times daily. 10/26/23   Samtani, Jai-Gurmukh, MD  ezetimibe (ZETIA) 10 MG tablet Take 10 mg by mouth in the morning. 08/30/22 12/04/26  [provider]  FEROSUL 325 (65 Fe) MG tablet Take 325 mg by mouth 3 (three) times a week. 01/31/23   [provider]  linaclotide (LINZESS) 72 MCG capsule Take 72 mcg by mouth daily.    [provider]  oxyCODONE  (OXY IR/ROXICODONE ) 5 MG immediate release tablet Take 5 mg by mouth in the morning, at noon, in the evening, and at bedtime.    [provider]  pantoprazole  (PROTONIX ) 40 MG tablet Take 1 tablet (40 mg total) by mouth 2 (two) times daily. 10/18/23 10/17/24  Samtani, Jai-Gurmukh, MD  pregabalin  (LYRICA ) 75 MG capsule Take 75 mg by mouth 2 (two) times daily.    [provider]  senna (SENOKOT) 8.6 MG tablet Take 2-3 tablets by mouth daily as needed for constipation.    [provider]  tamsulosin  (FLOMAX ) 0.4 MG CAPS capsule Take 0.4 mg by mouth in the morning. 01/03/22   [provider]  testosterone cypionate (DEPOTESTOSTERONE CYPIONATE) 200 MG/ML injection Inject 200 mg into the muscle every 21 ( twenty-one) days.    [provider]  traZODone  (DESYREL ) 50 MG tablet Take 50 mg by mouth at bedtime as needed for sleep.    [provider]    Physical Exam    Vital Signs:  Parker Daniel does not have vital signs available for review today.  Given telephonic nature of communication, physical exam is limited. AAOx3. NAD. Normal affect.  Speech and respirations are unlabored.  Accessory Clinical Findings    None  Assessment & Plan    1.  Preoperative Cardiovascular Risk Assessment: According to the Revised Cardiac Risk Index (RCRI), his Perioperative Risk of Major Cardiac Event is (%): 0.4. His Functional Capacity in METs is: 7.25 according to the Duke Activity Status Index (DASI). The patient is doing well from a cardiac perspective. Therefore, based on ACC/AHA guidelines, the patient would be at acceptable risk for the planned procedure without further cardiovascular testing.   The patient was advised that if he develops new symptoms prior to surgery  to contact our office to arrange for a follow-up visit, and he verbalized understanding.  Per office protocol, he may hold Eliquis   for 2 days prior to procedure and should resume as soon as hemodynamically stable postoperatively.   A copy of this note will be routed to requesting surgeon.  Time:   Today, I have spent 10 minutes with the patient with telehealth technology discussing medical history, symptoms, and management plan.     Rosaline EMERSON Bane, NP-C  08/13/2024, 3:19 PM [address] [place], [place] [address] Office [phone] Fax [phone]

## 2024-08-13 NOTE — Telephone Encounter (Signed)
 Called patient for preoperative cardiac evaluation for upcoming endoscopy. He asked about taking GLP1 agonist for management of weight loss and diabetes which he has discussed with PCP. He states PCP wanted to ensure agreement from cardiology before starting the medication. I advised patient that the GLP1 agonists can have cardiovascular benefit and that we there are no cardiac contraindications. He does not have family or personal history of medullary thyroid  cancer. He thanked for the information.

## 2024-08-21 ENCOUNTER — Other Ambulatory Visit: Payer: Self-pay | Admitting: Cardiovascular Disease
